# Patient Record
Sex: Male | Born: 1945 | Race: White | Hispanic: No | Smoking: Former smoker
Health system: Southern US, Community
[De-identification: ages and names within clinical notes are randomized; demographics above are authoritative.]

## PROBLEM LIST (undated history)

## (undated) DIAGNOSIS — I10 Essential (primary) hypertension: Secondary | ICD-10-CM

## (undated) DIAGNOSIS — C801 Malignant (primary) neoplasm, unspecified: Secondary | ICD-10-CM

## (undated) DIAGNOSIS — G8929 Other chronic pain: Secondary | ICD-10-CM

## (undated) DIAGNOSIS — G894 Chronic pain syndrome: Secondary | ICD-10-CM

## (undated) DIAGNOSIS — E785 Hyperlipidemia, unspecified: Secondary | ICD-10-CM

## (undated) DIAGNOSIS — K469 Unspecified abdominal hernia without obstruction or gangrene: Secondary | ICD-10-CM

## (undated) DIAGNOSIS — M549 Dorsalgia, unspecified: Principal | ICD-10-CM

## (undated) DIAGNOSIS — Z972 Presence of dental prosthetic device (complete) (partial): Secondary | ICD-10-CM

## (undated) DIAGNOSIS — Z8719 Personal history of other diseases of the digestive system: Secondary | ICD-10-CM

## (undated) DIAGNOSIS — A159 Respiratory tuberculosis unspecified: Secondary | ICD-10-CM

## (undated) DIAGNOSIS — K649 Unspecified hemorrhoids: Secondary | ICD-10-CM

## (undated) DIAGNOSIS — M545 Low back pain, unspecified: Secondary | ICD-10-CM

## (undated) DIAGNOSIS — M1990 Unspecified osteoarthritis, unspecified site: Secondary | ICD-10-CM

## (undated) DIAGNOSIS — K219 Gastro-esophageal reflux disease without esophagitis: Secondary | ICD-10-CM

## (undated) DIAGNOSIS — I2510 Atherosclerotic heart disease of native coronary artery without angina pectoris: Secondary | ICD-10-CM

## (undated) DIAGNOSIS — K2270 Barrett's esophagus without dysplasia: Secondary | ICD-10-CM

## (undated) DIAGNOSIS — F40240 Claustrophobia: Secondary | ICD-10-CM

## (undated) DIAGNOSIS — R634 Abnormal weight loss: Secondary | ICD-10-CM

## (undated) HISTORY — DX: Low back pain: M54.5

## (undated) HISTORY — DX: Dorsalgia, unspecified: M54.9

## (undated) HISTORY — DX: Chronic pain syndrome: G89.4

## (undated) HISTORY — PX: BACK SURGERY: SHX140

## (undated) HISTORY — DX: Gastro-esophageal reflux disease without esophagitis: K21.9

## (undated) HISTORY — PX: FOOT SURGERY: SHX648

## (undated) HISTORY — PX: MULTIPLE TOOTH EXTRACTIONS: SHX2053

## (undated) HISTORY — PX: OTHER SURGICAL HISTORY: SHX169

## (undated) HISTORY — DX: Low back pain, unspecified: M54.50

## (undated) HISTORY — PX: CYSTECTOMY: SUR359

## (undated) HISTORY — DX: Other chronic pain: G89.29

## (undated) HISTORY — PX: ESOPHAGEAL DILATION: SHX303

---
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
## 1994-03-25 HISTORY — PX: NECK SURGERY: SHX720

## 1996-03-25 HISTORY — PX: OTHER SURGICAL HISTORY: SHX169

## 1997-03-25 DIAGNOSIS — C801 Malignant (primary) neoplasm, unspecified: Secondary | ICD-10-CM

## 1997-03-25 HISTORY — DX: Malignant (primary) neoplasm, unspecified: C80.1

## 2005-08-28 ENCOUNTER — Ambulatory Visit: Payer: Self-pay | Admitting: Gastroenterology

## 2010-07-17 ENCOUNTER — Ambulatory Visit: Payer: Self-pay | Admitting: Emergency Medicine

## 2010-07-17 LAB — HM COLONOSCOPY

## 2010-09-05 ENCOUNTER — Ambulatory Visit: Payer: Self-pay | Admitting: Gastroenterology

## 2012-04-16 ENCOUNTER — Ambulatory Visit: Payer: Self-pay | Admitting: Emergency Medicine

## 2012-04-16 DIAGNOSIS — I10 Essential (primary) hypertension: Secondary | ICD-10-CM

## 2012-04-16 LAB — CBC WITH DIFFERENTIAL/PLATELET
Basophil #: 0.1 10*3/uL (ref 0.0–0.1)
Basophil %: 1 %
Eosinophil #: 0.2 10*3/uL (ref 0.0–0.7)
Eosinophil %: 2.2 %
HGB: 13.2 g/dL (ref 13.0–18.0)
Lymphocyte %: 14.1 %
MCH: 29.3 pg (ref 26.0–34.0)
MCHC: 32.5 g/dL (ref 32.0–36.0)
MCV: 90 fL (ref 80–100)
Monocyte %: 10.6 %
Neutrophil %: 72.1 %
RBC: 4.5 10*6/uL (ref 4.40–5.90)
WBC: 11.3 10*3/uL — ABNORMAL HIGH (ref 3.8–10.6)

## 2012-04-23 ENCOUNTER — Ambulatory Visit: Payer: Self-pay | Admitting: Emergency Medicine

## 2012-06-30 DIAGNOSIS — M069 Rheumatoid arthritis, unspecified: Secondary | ICD-10-CM | POA: Insufficient documentation

## 2013-01-19 DIAGNOSIS — L304 Erythema intertrigo: Secondary | ICD-10-CM | POA: Insufficient documentation

## 2013-03-25 DIAGNOSIS — A159 Respiratory tuberculosis unspecified: Secondary | ICD-10-CM

## 2013-03-25 HISTORY — DX: Respiratory tuberculosis unspecified: A15.9

## 2013-09-28 ENCOUNTER — Ambulatory Visit: Payer: Self-pay | Admitting: Family Medicine

## 2013-10-22 ENCOUNTER — Ambulatory Visit: Payer: Self-pay

## 2014-07-04 DIAGNOSIS — M0570 Rheumatoid arthritis with rheumatoid factor of unspecified site without organ or systems involvement: Secondary | ICD-10-CM | POA: Diagnosis not present

## 2014-07-15 NOTE — Op Note (Signed)
PATIENT NAME:  Travis Haynes, Travis Haynes MR#:  [id number] DATE OF BIRTH:  07-17-1945  DATE OF PROCEDURE:  04/23/2012  PREOPERATIVE DIAGNOSES:   1.  Hemorrhoids.  2.  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   POSTOPERATIVE DIAGNOSES:   1.  Hemorrhoids.  2.  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   PROCEDURE:  1.  Hemorrhoidectomy.  2.  Excision of soft tissue tumor of the left gluteal region.   SURGEON: Greydis Stlouis S. Phylis Bougie, M.D.   INDICATIONS: This patient has a history of rheumatoid arthritis and felt a lump in the ischial region on the back of his left leg. It is getting harder and it hurts when he sits for a long time. He says he has had this for a long time and it is very sore when he sits on it.   DESCRIPTION OF PROCEDURE:  The patient was brought to surgery. The jackknife position was made. The buttock was then pulled apart. The rectum part was then covered with a towel. Incision was made on top of this lump. After cutting skin and subcutaneous tissue, the patient had a nodule which was under the skin, was going down towards the joint. It was then excised. It was hard and it looked like it could be a rheumatic soft tissue nodule due to fibrous tissue or due to maybe cartilaginous tissue. The whole thing was then excised. The wound was then closed with 4-0 nylon interrupted stitches.   After that, rectal examination was performed and there was an area that looked like a big skin tag at about the 11 o'clock position. It was grasped with an Allis clamp, lifted up and dissected off from the skin around it and taken out into the rectal area. It was suture ligated there and then this area was excised with a little mucous membrane. Hemorrhoids were really small. It was then excised and the wound was then closed with running 4-0 chromic sutures. The other area, which was smaller than this, was also then lifted up and it was then excised and sutured with 4-0 chromic sutures. The patient tolerated the  procedure well Marcaine was injected and he was then sent to recovery room in satisfactory condition.   ____________________________ Welford Roche Phylis Bougie, MD msh:jm D: 04/23/2012 13:35:00 ET T: 04/23/2012 14:50:26 ET JOB#: [id number]  cc: Wanda Cellucci S. Phylis Bougie, MD, <Dictator> Sharene Butters MD ELECTRONICALLY SIGNED 04/23/2012 17:01

## 2014-07-18 DIAGNOSIS — I10 Essential (primary) hypertension: Secondary | ICD-10-CM | POA: Diagnosis not present

## 2014-07-18 DIAGNOSIS — A159 Respiratory tuberculosis unspecified: Secondary | ICD-10-CM | POA: Diagnosis not present

## 2014-07-18 DIAGNOSIS — K219 Gastro-esophageal reflux disease without esophagitis: Secondary | ICD-10-CM | POA: Diagnosis not present

## 2014-07-18 DIAGNOSIS — R52 Pain, unspecified: Secondary | ICD-10-CM | POA: Diagnosis not present

## 2014-07-18 DIAGNOSIS — M069 Rheumatoid arthritis, unspecified: Secondary | ICD-10-CM | POA: Diagnosis not present

## 2014-07-18 DIAGNOSIS — Z0000 Encounter for general adult medical examination without abnormal findings: Secondary | ICD-10-CM | POA: Diagnosis not present

## 2014-07-18 LAB — PSA

## 2014-09-20 ENCOUNTER — Telehealth: Payer: Self-pay | Admitting: Unknown Physician Specialty

## 2014-09-20 DIAGNOSIS — K219 Gastro-esophageal reflux disease without esophagitis: Secondary | ICD-10-CM

## 2014-09-20 DIAGNOSIS — K222 Esophageal obstruction: Principal | ICD-10-CM

## 2014-09-20 NOTE — Telephone Encounter (Signed)
Pt called would like a referral for [facility] to have esophagus stretched . Would like a call back at the number provided

## 2014-09-20 NOTE — Telephone Encounter (Signed)
Routing to provider  

## 2014-09-20 NOTE — Telephone Encounter (Signed)
Called and let patient know that the referral he requested was put in.

## 2014-11-21 DIAGNOSIS — R1310 Dysphagia, unspecified: Secondary | ICD-10-CM | POA: Diagnosis not present

## 2014-11-30 DIAGNOSIS — I2510 Atherosclerotic heart disease of native coronary artery without angina pectoris: Secondary | ICD-10-CM | POA: Diagnosis not present

## 2014-11-30 DIAGNOSIS — M069 Rheumatoid arthritis, unspecified: Secondary | ICD-10-CM | POA: Diagnosis not present

## 2014-11-30 DIAGNOSIS — K219 Gastro-esophageal reflux disease without esophagitis: Secondary | ICD-10-CM | POA: Diagnosis not present

## 2014-11-30 DIAGNOSIS — E784 Other hyperlipidemia: Secondary | ICD-10-CM | POA: Diagnosis not present

## 2014-11-30 DIAGNOSIS — I10 Essential (primary) hypertension: Secondary | ICD-10-CM | POA: Diagnosis not present

## 2014-12-02 ENCOUNTER — Encounter: Payer: Self-pay | Admitting: *Deleted

## 2014-12-05 ENCOUNTER — Ambulatory Visit: Payer: Medicare Other | Admitting: Anesthesiology

## 2014-12-05 ENCOUNTER — Ambulatory Visit
Admission: RE | Admit: 2014-12-05 | Discharge: 2014-12-05 | Disposition: A | Discharge status: Home / Self Care | Payer: Medicare Other | Source: Ambulatory Visit | Attending: Gastroenterology | Admitting: Gastroenterology

## 2014-12-05 ENCOUNTER — Encounter
Admission: RE | Disposition: A | Discharge status: Home / Self Care | Payer: Self-pay | Source: Ambulatory Visit | Attending: Gastroenterology

## 2014-12-05 ENCOUNTER — Encounter: Payer: Self-pay | Admitting: *Deleted

## 2014-12-05 DIAGNOSIS — I2510 Atherosclerotic heart disease of native coronary artery without angina pectoris: Secondary | ICD-10-CM | POA: Diagnosis not present

## 2014-12-05 DIAGNOSIS — Z87891 Personal history of nicotine dependence: Secondary | ICD-10-CM | POA: Insufficient documentation

## 2014-12-05 DIAGNOSIS — I10 Essential (primary) hypertension: Secondary | ICD-10-CM | POA: Diagnosis not present

## 2014-12-05 DIAGNOSIS — E785 Hyperlipidemia, unspecified: Secondary | ICD-10-CM | POA: Diagnosis not present

## 2014-12-05 DIAGNOSIS — R1310 Dysphagia, unspecified: Secondary | ICD-10-CM | POA: Diagnosis not present

## 2014-12-05 DIAGNOSIS — Z79899 Other long term (current) drug therapy: Secondary | ICD-10-CM | POA: Insufficient documentation

## 2014-12-05 DIAGNOSIS — K222 Esophageal obstruction: Secondary | ICD-10-CM | POA: Insufficient documentation

## 2014-12-05 DIAGNOSIS — Z7951 Long term (current) use of inhaled steroids: Secondary | ICD-10-CM | POA: Insufficient documentation

## 2014-12-05 DIAGNOSIS — K219 Gastro-esophageal reflux disease without esophagitis: Secondary | ICD-10-CM | POA: Diagnosis not present

## 2014-12-05 HISTORY — DX: Essential (primary) hypertension: I10

## 2014-12-05 HISTORY — DX: Unspecified osteoarthritis, unspecified site: M19.90

## 2014-12-05 HISTORY — PX: ESOPHAGOGASTRODUODENOSCOPY (EGD) WITH PROPOFOL: SHX5813

## 2014-12-05 HISTORY — DX: Unspecified abdominal hernia without obstruction or gangrene: K46.9

## 2014-12-05 HISTORY — DX: Atherosclerotic heart disease of native coronary artery without angina pectoris: I25.10

## 2014-12-05 HISTORY — DX: Unspecified hemorrhoids: K64.9

## 2014-12-05 HISTORY — DX: Hyperlipidemia, unspecified: E78.5

## 2014-12-05 HISTORY — DX: Barrett's esophagus without dysplasia: K22.70

## 2014-12-05 SURGERY — ESOPHAGOGASTRODUODENOSCOPY (EGD) WITH PROPOFOL
Anesthesia: General

## 2014-12-05 MED ORDER — FENTANYL CITRATE (PF) 100 MCG/2ML IJ SOLN
INTRAMUSCULAR | Status: DC | PRN
  Administered 2014-12-05: 50 ug via INTRAVENOUS

## 2014-12-05 MED ORDER — SODIUM CHLORIDE 0.9 % IV SOLN
INTRAVENOUS | Status: DC
  Administered 2014-12-05: 1000 mL via INTRAVENOUS

## 2014-12-05 MED ORDER — PROPOFOL 10 MG/ML IV BOLUS
INTRAVENOUS | Status: DC | PRN
  Administered 2014-12-05: 50 mg via INTRAVENOUS
  Administered 2014-12-05: 55 mg via INTRAVENOUS
  Administered 2014-12-05: 50 mg via INTRAVENOUS

## 2014-12-05 MED ORDER — MIDAZOLAM HCL 5 MG/5ML IJ SOLN
INTRAMUSCULAR | Status: DC | PRN
  Administered 2014-12-05: 1 mg via INTRAVENOUS

## 2014-12-05 NOTE — H&P (Signed)
  Date of Initial H&P: 11/21/2014  History reviewed, patient examined, no change in status, stable for surgery.

## 2014-12-05 NOTE — Anesthesia Postprocedure Evaluation (Signed)
  Anesthesia Post-op Note  Patient: Travis Haynes  Procedure(s) Performed: Procedure(s): ESOPHAGOGASTRODUODENOSCOPY (EGD) WITH PROPOFOL (N/A)  Anesthesia type:General  Patient location: PACU  Post pain: Pain level controlled  Post assessment: Post-op Vital signs reviewed, Patient's Cardiovascular Status Stable, Respiratory Function Stable, Patent Airway and No signs of Nausea or vomiting  Post vital signs: Reviewed and stable  Last Vitals:  Filed Vitals:   12/05/14 1020  BP: 154/65  Pulse: 92  Temp: 36.8 C  Resp: 18    Level of consciousness: awake, alert  and patient cooperative  Complications: No apparent anesthesia complications

## 2014-12-05 NOTE — Anesthesia Preprocedure Evaluation (Signed)
Anesthesia Evaluation    Airway Mallampati: II  TM Distance: >3 FB Neck ROM: Full    Dental  (+) Edentulous Upper, Edentulous Lower   Pulmonary former smoker,           Cardiovascular hypertension, Pt. on medications      Neuro/Psych    GI/Hepatic GERD  Medicated and Controlled,  Endo/Other    Renal/GU      Musculoskeletal   Abdominal   Peds  Hematology   Anesthesia Other Findings   Reproductive/Obstetrics                             Anesthesia Physical Anesthesia Plan  ASA: II  Anesthesia Plan: General   Post-op Pain Management:    Induction: Intravenous  Airway Management Planned: Nasal Cannula  Additional Equipment:   Intra-op Plan:   Post-operative Plan:   Informed Consent: I have reviewed the patients History and Physical, chart, labs and discussed the procedure including the risks, benefits and alternatives for the proposed anesthesia with the patient or authorized representative who has indicated his/her understanding and acceptance.     Plan Discussed with:   Anesthesia Plan Comments:         Anesthesia Quick Evaluation

## 2014-12-05 NOTE — Transfer of Care (Signed)
Immediate Anesthesia Transfer of Care Note  Patient: Travis Haynes  Procedure(s) Performed: Procedure(s): ESOPHAGOGASTRODUODENOSCOPY (EGD) WITH PROPOFOL (N/A)  Patient Location: PACU  Anesthesia Type:General  Level of Consciousness: sedated  Airway & Oxygen Therapy: Patient Spontanous Breathing  Post-op Assessment: Report given to RN  Post vital signs: stable  Last Vitals:  Filed Vitals:   12/05/14 1020  BP: 154/65  Pulse: 92  Temp: 36.8 C  Resp: 18    Complications: No apparent anesthesia complications

## 2014-12-05 NOTE — Op Note (Signed)
[facility] Gastroenterology Patient Name: Travis Haynes Procedure Date: 12/05/2014 10:42 AM MRN: [id number] Account #: [id number] Date of Birth: 1945-07-06 Admit Type: Outpatient Age: 69 Room: [facility] ENDO ROOM 4 Gender: Male Note Status: Finalized Procedure:         Upper GI endoscopy Indications:       Dysphagia Providers:         Lupita Dawn. Candace Cruise, MD Referring MD:      Guadalupe Maple, MD (Referring MD) Medicines:         Monitored Anesthesia Care Complications:     No immediate complications. Procedure:         Pre-Anesthesia Assessment:                    - Prior to the procedure, a History and Physical was                     performed, and patient medications, allergies and                     sensitivities were reviewed. The patient's tolerance of                     previous anesthesia was reviewed.                    - The risks and benefits of the procedure and the sedation                     options and risks were discussed with the patient. All                     questions were answered and informed consent was obtained.                    - After reviewing the risks and benefits, the patient was                     deemed in satisfactory condition to undergo the procedure.                    After obtaining informed consent, the endoscope was passed                     under direct vision. Throughout the procedure, the                     patient's blood pressure, pulse, and oxygen saturations                     were monitored continuously. The Olympus GIF-160 endoscope                     (S#. [id number]) was introduced through the mouth, and                     advanced to the second part of duodenum. The upper GI                     endoscopy was accomplished without difficulty. The upper                     GI endoscopy was accomplished without difficulty. The  patient tolerated the procedure well. Findings:      A benign-appearing,  intrinsic stenosis was found at the gastroesophageal       junction. The scope was withdrawn. Dilation was performed with a Maloney       dilator with moderate resistance at 54 Fr.      The exam was otherwise without abnormality.      The entire examined stomach was normal.      The examined duodenum was normal. Impression:        - Benign-appearing esophageal stricture. Dilated.                    - The examination was otherwise normal.                    - Normal stomach.                    - Normal examined duodenum.                    - No specimens collected. Recommendation:    - Discharge patient to home.                    - Observe patient's clinical course.                    - The findings and recommendations were discussed with the                     patient. Procedure Code(s): --- Professional ---                    [phone], Esophagogastroduodenoscopy, flexible, transoral;                     diagnostic, including collection of specimen(s) by                     brushing or washing, when performed (separate procedure)                    43450, Dilation of esophagus, by unguided sound or bougie,                     single or multiple passes Diagnosis Code(s): --- Professional ---                    K22.2, Esophageal obstruction                    R13.10, Dysphagia, unspecified CPT copyright 2014 American Medical Association. All rights reserved. The codes documented in this report are preliminary and upon coder review may  be revised to meet current compliance requirements. Hulen Luster, MD 12/05/2014 10:59:26 AM This report has been signed electronically. Number of Addenda: 0 Note Initiated On: 12/05/2014 10:42 AM      [facility]

## 2014-12-06 ENCOUNTER — Encounter: Payer: Self-pay | Admitting: Gastroenterology

## 2015-01-16 DIAGNOSIS — M549 Dorsalgia, unspecified: Principal | ICD-10-CM

## 2015-01-16 DIAGNOSIS — A159 Respiratory tuberculosis unspecified: Secondary | ICD-10-CM

## 2015-01-16 DIAGNOSIS — E785 Hyperlipidemia, unspecified: Secondary | ICD-10-CM

## 2015-01-16 DIAGNOSIS — K219 Gastro-esophageal reflux disease without esophagitis: Secondary | ICD-10-CM

## 2015-01-16 DIAGNOSIS — G8929 Other chronic pain: Secondary | ICD-10-CM

## 2015-01-16 DIAGNOSIS — I2510 Atherosclerotic heart disease of native coronary artery without angina pectoris: Secondary | ICD-10-CM | POA: Insufficient documentation

## 2015-01-16 DIAGNOSIS — I10 Essential (primary) hypertension: Secondary | ICD-10-CM | POA: Insufficient documentation

## 2015-01-18 ENCOUNTER — Other Ambulatory Visit: Payer: Self-pay

## 2015-01-18 NOTE — Telephone Encounter (Signed)
LAST VISIT: 07/18/2014 Patient has an upcoming appointment 01/23/2015 [facility]: [phone]  Requesting tramadol 50 mg.

## 2015-01-19 MED ORDER — TRAMADOL HCL 50 MG PO TABS: 50.0000 mg | ORAL_TABLET | Freq: Three times a day (TID) | ORAL | Status: DC | PRN

## 2015-01-23 ENCOUNTER — Encounter: Payer: Self-pay | Admitting: Unknown Physician Specialty

## 2015-01-23 ENCOUNTER — Ambulatory Visit (INDEPENDENT_AMBULATORY_CARE_PROVIDER_SITE_OTHER): Payer: Medicare Other | Admitting: Unknown Physician Specialty

## 2015-01-23 VITALS — BP 145/80 | HR 101 | Temp 98.5°F | Ht 67.3 in | Wt 168.2 lb

## 2015-01-23 DIAGNOSIS — Z0000 Encounter for general adult medical examination without abnormal findings: Secondary | ICD-10-CM | POA: Diagnosis not present

## 2015-01-23 DIAGNOSIS — E785 Hyperlipidemia, unspecified: Secondary | ICD-10-CM

## 2015-01-23 DIAGNOSIS — J309 Allergic rhinitis, unspecified: Secondary | ICD-10-CM | POA: Insufficient documentation

## 2015-01-23 DIAGNOSIS — I10 Essential (primary) hypertension: Secondary | ICD-10-CM

## 2015-01-23 MED ORDER — OMEPRAZOLE 20 MG PO CPDR: 40.0000 mg | DELAYED_RELEASE_CAPSULE | Freq: Every day | ORAL | Status: DC

## 2015-01-23 MED ORDER — ATORVASTATIN CALCIUM 10 MG PO TABS: 10.0000 mg | ORAL_TABLET | Freq: Every day | ORAL | Status: DC

## 2015-01-23 MED ORDER — FLUTICASONE PROPIONATE 50 MCG/ACT NA SUSP: 2.0000 | Freq: Every day | NASAL | Status: DC

## 2015-01-23 MED ORDER — AMLODIPINE BESYLATE 10 MG PO TABS: 10.0000 mg | ORAL_TABLET | Freq: Every day | ORAL | Status: DC

## 2015-01-23 NOTE — Assessment & Plan Note (Signed)
Will rx flonase.

## 2015-01-23 NOTE — Progress Notes (Signed)
BP 145/80 mmHg  Pulse 101  Temp(Src) 98.5 F (36.9 C)  Ht 5' 7.3" (1.709 m)  Wt 168 lb 3.2 oz (76.295 kg)  BMI 26.12 kg/m2  SpO2 97%   Subjective:    Patient ID: Travis Haynes, male    DOB: 14-Jun-1945, 69 y.o.   MRN: [id number]  HPI: Travis Haynes is a 69 y.o. male  Chief Complaint  Patient presents with  . Hyperlipidemia  . Hypertension   Hypertension:  Using medications without difficulty Average home BP SBP 120's  Using medication without problems or lightheadedness No chest pain with exertion or shortness of breath Edema: States his legs and feet "swell some."  Elevated Cholesterol: Using medications without problems: Muscle aches: left calve that is long-standing Diet compliance: good Exercise: none due to chronic pain Other complaints:  Pain: Present dose:   The morphine equivalent dose is 30 mgs.  Takes Tylenol in the AM (if he is going somewhere that day). Will take the Aleve about 2 pm and once about midnight. Takes Tramadol 50 mg 3 times daily. Tramadol useful at night for muscle spasm. Prednisone helps a lot with pain.  Pain control status:  controlled  H6 Location:  H1 all over because of arthritis. Can be bad in hands when using cane.  Quality:  sore H2 Current pain level:  mild H3 Aggravating factors:   worse in the morning  H7 Benefit from narcotic medications:  yes  H6 What Activities task can be accomplished with current medication?  sleeps better  Interested in weaning off narcotics:  no H6    Relevant past medical, surgical, family and social history reviewed and updated as indicated. Interim medical history since our last visit reviewed. Allergies and medications reviewed and updated.  Review of Systems  HENT: Positive for congestion.        Itchy nose and sometimes sore.   Eyes: Negative.   Respiratory: Negative.   Cardiovascular: Negative.   Gastrointestinal: Negative.   Endocrine: Negative.   Genitourinary: Negative.    Musculoskeletal: Positive for joint swelling.  Skin: Negative.   Neurological: Negative.   Psychiatric/Behavioral: Negative.     Per HPI unless specifically indicated above     Objective:    BP 145/80 mmHg  Pulse 101  Temp(Src) 98.5 F (36.9 C)  Ht 5' 7.3" (1.709 m)  Wt 168 lb 3.2 oz (76.295 kg)  BMI 26.12 kg/m2  SpO2 97%  Wt Readings from Last 3 Encounters:  01/23/15 168 lb 3.2 oz (76.295 kg)  07/18/14 167 lb (75.751 kg)  12/05/14 170 lb (77.111 kg)    Physical Exam  Constitutional: He is oriented to person, place, and time. He appears well-developed and well-nourished. No distress.  HENT:  Head: Normocephalic and atraumatic.  Eyes: Conjunctivae and lids are normal. Right eye exhibits no discharge. Left eye exhibits no discharge. No scleral icterus.  Cardiovascular: Normal rate, regular rhythm and normal heart sounds.   Pulmonary/Chest: Effort normal and breath sounds normal. No respiratory distress.  Abdominal: Normal appearance. He exhibits no distension. There is no splenomegaly or hepatomegaly. There is no tenderness.  Musculoskeletal: Normal range of motion.  Neurological: He is alert and oriented to person, place, and time.  Skin: Skin is intact. No rash noted. No pallor.  Psychiatric: He has a normal mood and affect. His behavior is normal. Judgment and thought content normal.      Assessment & Plan:   Problem List Items Addressed This Visit  Unprioritized   BP (high blood pressure)    A little high today, but good numbers at home.        Relevant Medications   amLODipine (NORVASC) 10 MG tablet   atorvastatin (LIPITOR) 10 MG tablet   Hyperlipidemia - Primary   Relevant Medications   amLODipine (NORVASC) 10 MG tablet   atorvastatin (LIPITOR) 10 MG tablet   Other Relevant Orders   Comprehensive metabolic panel   Lipid Panel w/o Chol/HDL Ratio   Allergic rhinitis    Will rx flonase.         Other Visit Diagnoses    Routine health maintenance         Relevant Orders    Hepatitis C antibody        Follow up plan: Return in about 6 months (around 07/23/2015) for physical.

## 2015-01-23 NOTE — Assessment & Plan Note (Signed)
A little high today, but good numbers at home.

## 2015-01-24 ENCOUNTER — Encounter: Payer: Self-pay | Admitting: Unknown Physician Specialty

## 2015-01-24 LAB — COMPREHENSIVE METABOLIC PANEL
ALBUMIN: 4.1 g/dL (ref 3.6–4.8)
ALT: 21 IU/L (ref 0–44)
AST: 22 IU/L (ref 0–40)
Albumin/Globulin Ratio: 1.3 (ref 1.1–2.5)
Alkaline Phosphatase: 88 IU/L (ref 39–117)
BILIRUBIN TOTAL: 0.6 mg/dL (ref 0.0–1.2)
BUN / CREAT RATIO: 14 (ref 10–22)
BUN: 12 mg/dL (ref 8–27)
CALCIUM: 9.2 mg/dL (ref 8.6–10.2)
CHLORIDE: 97 mmol/L (ref 97–106)
CO2: 24 mmol/L (ref 18–29)
CREATININE: 0.88 mg/dL (ref 0.76–1.27)
GFR, EST AFRICAN AMERICAN: 102 mL/min/{1.73_m2} (ref >59)
GFR, EST NON AFRICAN AMERICAN: 88 mL/min/{1.73_m2} (ref >59)
GLUCOSE: 108 mg/dL — AB (ref 65–99)
Globulin, Total: 3.1 g/dL (ref 1.5–4.5)
Potassium: 3.9 mmol/L (ref 3.5–5.2)
Sodium: 138 mmol/L (ref 136–144)
TOTAL PROTEIN: 7.2 g/dL (ref 6.0–8.5)

## 2015-01-24 LAB — LIPID PANEL W/O CHOL/HDL RATIO
CHOLESTEROL TOTAL: 122 mg/dL (ref 100–199)
HDL: 52 mg/dL (ref >39)
LDL CALC: 48 mg/dL (ref 0–99)
TRIGLYCERIDES: 111 mg/dL (ref 0–149)
VLDL CHOLESTEROL CAL: 22 mg/dL (ref 5–40)

## 2015-01-24 LAB — HEPATITIS C ANTIBODY: Hep C Virus Ab: <0.1 s/co ratio (ref 0.0–0.9)

## 2015-01-24 LAB — LIPID PANEL PICCOLO, WAIVED

## 2015-01-24 NOTE — Progress Notes (Signed)
Quick Note:  Normal labs. Patient notified by letter. ______ 

## 2015-03-22 DIAGNOSIS — L304 Erythema intertrigo: Secondary | ICD-10-CM | POA: Diagnosis not present

## 2015-03-22 DIAGNOSIS — L219 Seborrheic dermatitis, unspecified: Secondary | ICD-10-CM | POA: Diagnosis not present

## 2015-03-22 DIAGNOSIS — L918 Other hypertrophic disorders of the skin: Secondary | ICD-10-CM | POA: Diagnosis not present

## 2015-05-23 ENCOUNTER — Other Ambulatory Visit: Payer: Self-pay

## 2015-05-23 MED ORDER — TRAMADOL HCL 50 MG PO TABS: 50.0000 mg | ORAL_TABLET | Freq: Three times a day (TID) | ORAL | Status: DC | PRN

## 2015-05-23 NOTE — Telephone Encounter (Signed)
Patient was last seen 01/23/15 and has appointment 07/21/15.

## 2015-06-19 ENCOUNTER — Other Ambulatory Visit: Payer: Self-pay

## 2015-06-19 MED ORDER — TRAMADOL HCL 50 MG PO TABS: 50.0000 mg | ORAL_TABLET | Freq: Three times a day (TID) | ORAL | Status: DC | PRN

## 2015-06-19 NOTE — Telephone Encounter (Signed)
Patient was last seen 01/23/15 and has appointment 07/21/15.

## 2015-07-03 DIAGNOSIS — M059 Rheumatoid arthritis with rheumatoid factor, unspecified: Secondary | ICD-10-CM | POA: Diagnosis not present

## 2015-07-03 DIAGNOSIS — Z7951 Long term (current) use of inhaled steroids: Secondary | ICD-10-CM | POA: Diagnosis not present

## 2015-07-03 DIAGNOSIS — Z7409 Other reduced mobility: Secondary | ICD-10-CM | POA: Diagnosis not present

## 2015-07-03 DIAGNOSIS — Z7952 Long term (current) use of systemic steroids: Secondary | ICD-10-CM | POA: Diagnosis not present

## 2015-07-03 DIAGNOSIS — Z5181 Encounter for therapeutic drug level monitoring: Secondary | ICD-10-CM | POA: Diagnosis not present

## 2015-07-03 DIAGNOSIS — Z9225 Personal history of immunosupression therapy: Secondary | ICD-10-CM | POA: Diagnosis not present

## 2015-07-03 DIAGNOSIS — Z79899 Other long term (current) drug therapy: Secondary | ICD-10-CM | POA: Diagnosis not present

## 2015-07-03 DIAGNOSIS — Z791 Long term (current) use of non-steroidal anti-inflammatories (NSAID): Secondary | ICD-10-CM | POA: Diagnosis not present

## 2015-07-03 DIAGNOSIS — Z79891 Long term (current) use of opiate analgesic: Secondary | ICD-10-CM | POA: Diagnosis not present

## 2015-07-03 DIAGNOSIS — Z9221 Personal history of antineoplastic chemotherapy: Secondary | ICD-10-CM | POA: Diagnosis not present

## 2015-07-03 DIAGNOSIS — I10 Essential (primary) hypertension: Secondary | ICD-10-CM | POA: Diagnosis not present

## 2015-07-03 DIAGNOSIS — Z87891 Personal history of nicotine dependence: Secondary | ICD-10-CM | POA: Diagnosis not present

## 2015-07-03 DIAGNOSIS — Z981 Arthrodesis status: Secondary | ICD-10-CM | POA: Diagnosis not present

## 2015-07-03 DIAGNOSIS — Z888 Allergy status to other drugs, medicaments and biological substances status: Secondary | ICD-10-CM | POA: Diagnosis not present

## 2015-07-03 DIAGNOSIS — C8513 Unspecified B-cell lymphoma, intra-abdominal lymph nodes: Secondary | ICD-10-CM | POA: Diagnosis not present

## 2015-07-21 ENCOUNTER — Ambulatory Visit (INDEPENDENT_AMBULATORY_CARE_PROVIDER_SITE_OTHER): Payer: Medicare Other | Admitting: Unknown Physician Specialty

## 2015-07-21 ENCOUNTER — Encounter: Payer: Self-pay | Admitting: Unknown Physician Specialty

## 2015-07-21 VITALS — BP 136/75 | HR 111 | Temp 98.6°F | Ht 67.8 in | Wt 155.2 lb

## 2015-07-21 DIAGNOSIS — I10 Essential (primary) hypertension: Secondary | ICD-10-CM

## 2015-07-21 DIAGNOSIS — E785 Hyperlipidemia, unspecified: Secondary | ICD-10-CM

## 2015-07-21 DIAGNOSIS — G8929 Other chronic pain: Secondary | ICD-10-CM

## 2015-07-21 MED ORDER — TRAMADOL HCL 50 MG PO TABS: 50.0000 mg | ORAL_TABLET | Freq: Three times a day (TID) | ORAL | Status: DC | PRN

## 2015-07-21 MED ORDER — ATORVASTATIN CALCIUM 10 MG PO TABS: 10.0000 mg | ORAL_TABLET | Freq: Every day | ORAL | Status: DC

## 2015-07-21 MED ORDER — AMLODIPINE BESYLATE 10 MG PO TABS: 10.0000 mg | ORAL_TABLET | Freq: Every day | ORAL | Status: DC

## 2015-07-21 MED ORDER — OMEPRAZOLE 20 MG PO CPDR: 40.0000 mg | DELAYED_RELEASE_CAPSULE | Freq: Every day | ORAL | Status: DC

## 2015-07-21 NOTE — Assessment & Plan Note (Signed)
Foot edema is a problem.  Decrease Amlodipine to 5 mg by taking 1/2 pill and see if that helps

## 2015-07-21 NOTE — Assessment & Plan Note (Signed)
Continue Tramadol as prescribed

## 2015-07-21 NOTE — Progress Notes (Signed)
BP 136/75 mmHg  Pulse 111  Temp(Src) 98.6 F (37 C)  Ht 5' 7.8" (1.722 m)  Wt 155 lb 3.2 oz (70.398 kg)  BMI 23.74 kg/m2  SpO2 96%   Subjective:    Patient ID: Travis Haynes, male    DOB: 1945/10/06, 70 y.o.   MRN: [id number]  HPI: Travis Haynes is a 70 y.o. male  Chief Complaint  Patient presents with  . Hyperlipidemia  . Pain  . Medication Refill    pt states he needs tramadol refilled, would like 6 month supply   Hypertension:  Using medications without difficulty Average home BP SBP 120's  Using medication without problems or lightheadedness No chest pain with exertion or shortness of breath Edema: States his feet swell.  This has gotten worse in the last year.    Elevated Cholesterol: Using medications without problems: Muscle aches: left calve that is long-standing Diet compliance: good Exercise: none due to chronic pain  Had labs through [facility].  All but cholesterol but that was good 6 months ago   Pain: Present dose: On Tramadol.  The morphine equivalent dose is 30 mgs.  Takes Tylenol in the AM (if he is going somewhere that day). Will take the Aleve about 2 pm and once about midnight. Takes Tramadol 50 mg 3 times daily. Tramadol useful at night for muscle spasm. Prednisone helps a lot with pain.  Pain control status:  controlled  H6 Location:  H1 all over because of arthritis. Can be bad in hands when using cane.  Quality:  sore H2 Current pain level:  mild H3 Aggravating factors:   worse in the morning  H7 Benefit from narcotic medications:  yes  H6 What Activities task can be accomplished with current medication?  sleeps better  Interested in weaning off narcotics:  no H6    Relevant past medical, surgical, family and social history reviewed and updated as indicated. Interim medical history since our last visit reviewed. Allergies and medications reviewed and updated.  Review of Systems  HENT: Positive for congestion.        Itchy nose and  sometimes sore.   Eyes: Negative.   Respiratory: Negative.   Cardiovascular: Negative.   Gastrointestinal: Negative.   Endocrine: Negative.   Genitourinary: Negative.   Musculoskeletal: Positive for joint swelling.  Skin: Negative.   Neurological: Negative.   Psychiatric/Behavioral: Negative.     Per HPI unless specifically indicated above     Objective:    BP 136/75 mmHg  Pulse 111  Temp(Src) 98.6 F (37 C)  Ht 5' 7.8" (1.722 m)  Wt 155 lb 3.2 oz (70.398 kg)  BMI 23.74 kg/m2  SpO2 96%  Wt Readings from Last 3 Encounters:  07/21/15 155 lb 3.2 oz (70.398 kg)  01/23/15 168 lb 3.2 oz (76.295 kg)  07/18/14 167 lb (75.751 kg)    Physical Exam  Constitutional: He is oriented to person, place, and time. He appears well-developed and well-nourished. No distress.  HENT:  Head: Normocephalic and atraumatic.  Eyes: Conjunctivae and lids are normal. Right eye exhibits no discharge. Left eye exhibits no discharge. No scleral icterus.  Cardiovascular: Normal rate, regular rhythm and normal heart sounds.   Pulmonary/Chest: Effort normal and breath sounds normal. No respiratory distress.  Abdominal: Normal appearance. He exhibits no distension. There is no splenomegaly or hepatomegaly. There is no tenderness.  Musculoskeletal: Normal range of motion.  Neurological: He is alert and oriented to person, place, and time.  Skin:  Skin is intact. No rash noted. No pallor.  Psychiatric: He has a normal mood and affect. His behavior is normal. Judgment and thought content normal.   Labs from [facility] reviewed    Assessment & Plan:   Problem List Items Addressed This Visit      Unprioritized   BP (high blood pressure) - Primary    Foot edema is a problem.  Decrease Amlodipine to 5 mg by taking 1/2 pill and see if that helps      Relevant Medications   atorvastatin (LIPITOR) 10 MG tablet   amLODipine (NORVASC) 10 MG tablet   Chronic pain    Continue Tramadol as prescribed       Relevant Medications   traMADol (ULTRAM) 50 MG tablet   Hyperlipidemia    Stable, continue present medications.        Relevant Medications   atorvastatin (LIPITOR) 10 MG tablet   amLODipine (NORVASC) 10 MG tablet       Follow up plan: Return in about 4 weeks (around 08/18/2015).

## 2015-07-21 NOTE — Assessment & Plan Note (Signed)
Stable, continue present medications.   

## 2015-07-31 ENCOUNTER — Telehealth: Payer: Self-pay | Admitting: Unknown Physician Specialty

## 2015-07-31 MED ORDER — HYDROCHLOROTHIAZIDE 25 MG PO TABS: 25.0000 mg | ORAL_TABLET | Freq: Every day | ORAL | Status: DC

## 2015-07-31 NOTE — Telephone Encounter (Signed)
Pt was calling to find out if he would be prescribed a fluid pill. If so he would like it to go to [facility].

## 2015-07-31 NOTE — Telephone Encounter (Signed)
Called and let patient know rx was sent in.  

## 2015-07-31 NOTE — Telephone Encounter (Signed)
Routing to provider  

## 2015-07-31 NOTE — Telephone Encounter (Signed)
That would be fine.  Ordered

## 2015-08-22 ENCOUNTER — Encounter: Payer: Self-pay | Admitting: Unknown Physician Specialty

## 2015-08-22 ENCOUNTER — Ambulatory Visit (INDEPENDENT_AMBULATORY_CARE_PROVIDER_SITE_OTHER): Payer: Medicare Other | Admitting: Unknown Physician Specialty

## 2015-08-22 VITALS — BP 129/71 | HR 87 | Temp 98.2°F | Ht 67.5 in | Wt 158.6 lb

## 2015-08-22 DIAGNOSIS — I10 Essential (primary) hypertension: Secondary | ICD-10-CM | POA: Diagnosis not present

## 2015-08-22 MED ORDER — AMLODIPINE BESYLATE 5 MG PO TABS: 5.0000 mg | ORAL_TABLET | Freq: Every day | ORAL | Status: DC

## 2015-08-22 NOTE — Progress Notes (Signed)
BP 129/71 mmHg  Pulse 87  Temp(Src) 98.2 F (36.8 C)  Ht 5' 7.5" (1.715 m)  Wt 158 lb 9.6 oz (71.94 kg)  BMI 24.46 kg/m2  SpO2 97%   Subjective:    Patient ID: Travis Haynes, male    DOB: 20-Dec-1945, 70 y.o.   MRN: [id number]  HPI: LYFE FITZKE is a 70 y.o. male  Chief Complaint  Patient presents with  . Hypertension  . Medication Refill    pt states he would like to have 5 mg tablets of amlodipine sent in instead of breaking the 10 mg in half   Hypertension Last visit we decreased Amlodipine to 5 mg due to foot swelling.  Foot swelling is resolved.   Average home BP 120's/70's  No problems or lightheadedness No chest pain with exertion or shortness of breath   Relevant past medical, surgical, family and social history reviewed and updated as indicated. Interim medical history since our last visit reviewed. Allergies and medications reviewed and updated.  Review of Systems  Per HPI unless specifically indicated above     Objective:    BP 129/71 mmHg  Pulse 87  Temp(Src) 98.2 F (36.8 C)  Ht 5' 7.5" (1.715 m)  Wt 158 lb 9.6 oz (71.94 kg)  BMI 24.46 kg/m2  SpO2 97%  Wt Readings from Last 3 Encounters:  08/22/15 158 lb 9.6 oz (71.94 kg)  07/21/15 155 lb 3.2 oz (70.398 kg)  01/23/15 168 lb 3.2 oz (76.295 kg)    Physical Exam  Constitutional: He is oriented to person, place, and time. He appears well-developed and well-nourished. No distress.  HENT:  Head: Normocephalic and atraumatic.  Eyes: Conjunctivae and lids are normal. Right eye exhibits no discharge. Left eye exhibits no discharge. No scleral icterus.  Neck: Normal range of motion. Neck supple. No JVD present. Carotid bruit is not present.  Cardiovascular: Normal rate, regular rhythm and normal heart sounds.   Pulmonary/Chest: Effort normal and breath sounds normal. No respiratory distress.  Abdominal: Normal appearance. There is no splenomegaly or hepatomegaly.  Musculoskeletal: Normal range of  motion.  Neurological: He is alert and oriented to person, place, and time.  Skin: Skin is warm, dry and intact. No rash noted. No pallor.  Psychiatric: He has a normal mood and affect. His behavior is normal. Judgment and thought content normal.    Results for orders placed or performed in visit on 01/23/15  Hepatitis C antibody  Result Value Ref Range   Hep C Virus Ab <0.1 0.0 - 0.9 s/co ratio  Comprehensive metabolic panel  Result Value Ref Range   Glucose 108 (H) 65 - 99 mg/dL   BUN 12 8 - 27 mg/dL   Creatinine, Ser 0.88 0.76 - 1.27 mg/dL   GFR calc non Af Amer 88 >59 mL/min/1.73   GFR calc Af Amer 102 >59 mL/min/1.73   BUN/Creatinine Ratio 14 10 - 22   Sodium 138 136 - 144 mmol/L   Potassium 3.9 3.5 - 5.2 mmol/L   Chloride 97 97 - 106 mmol/L   CO2 24 18 - 29 mmol/L   Calcium 9.2 8.6 - 10.2 mg/dL   Total Protein 7.2 6.0 - 8.5 g/dL   Albumin 4.1 3.6 - 4.8 g/dL   Globulin, Total 3.1 1.5 - 4.5 g/dL   Albumin/Globulin Ratio 1.3 1.1 - 2.5   Bilirubin Total 0.6 0.0 - 1.2 mg/dL   Alkaline Phosphatase 88 39 - 117 IU/L   AST 22 0 - 40  IU/L   ALT 21 0 - 44 IU/L  Lipid Panel w/o Chol/HDL Ratio  Result Value Ref Range   Cholesterol, Total CANCELED mg/dL   Triglycerides CANCELED    HDL CANCELED   Lipid Panel w/o Chol/HDL Ratio  Result Value Ref Range   Cholesterol, Total 122 100 - 199 mg/dL   Triglycerides 111 0 - 149 mg/dL   HDL 52 >39 mg/dL   VLDL Cholesterol Cal 22 5 - 40 mg/dL   LDL Calculated 48 0 - 99 mg/dL  Lipid Panel Piccolo, Waived  Result Value Ref Range   Cholesterol Piccolo, Waived CANCELED mg/dL   HDL Chol Piccolo, Waived CANCELED    Triglycerides Piccolo,Waived CANCELED       Assessment & Plan:   Problem List Items Addressed This Visit      Unprioritized   BP (high blood pressure) - Primary    Stable on 5 mg of Amlodipine      Relevant Medications   amLODipine (NORVASC) 5 MG tablet       Follow up plan: Return in about 5 months (around  01/22/2016).

## 2015-08-22 NOTE — Assessment & Plan Note (Signed)
Stable on 5 mg of Amlodipine

## 2015-11-03 ENCOUNTER — Other Ambulatory Visit: Payer: Self-pay | Admitting: Family Medicine

## 2015-11-03 ENCOUNTER — Other Ambulatory Visit: Payer: Self-pay

## 2015-11-03 MED ORDER — HYDROCHLOROTHIAZIDE 25 MG PO TABS: 25.0000 mg | ORAL_TABLET | Freq: Every day | ORAL | 2 refills | Status: DC

## 2015-12-27 ENCOUNTER — Encounter: Payer: Self-pay | Admitting: Unknown Physician Specialty

## 2015-12-27 ENCOUNTER — Ambulatory Visit (INDEPENDENT_AMBULATORY_CARE_PROVIDER_SITE_OTHER): Payer: Medicare Other | Admitting: Unknown Physician Specialty

## 2015-12-27 DIAGNOSIS — L97511 Non-pressure chronic ulcer of other part of right foot limited to breakdown of skin: Secondary | ICD-10-CM

## 2015-12-27 DIAGNOSIS — H1013 Acute atopic conjunctivitis, bilateral: Secondary | ICD-10-CM | POA: Insufficient documentation

## 2015-12-27 DIAGNOSIS — L97519 Non-pressure chronic ulcer of other part of right foot with unspecified severity: Secondary | ICD-10-CM | POA: Insufficient documentation

## 2015-12-27 MED ORDER — CROMOLYN SODIUM 4 % OP SOLN: 1.0000 [drp] | Freq: Four times a day (QID) | OPHTHALMIC | 12 refills | Status: DC

## 2015-12-27 NOTE — Progress Notes (Signed)
BP 128/81 (BP Location: Left Arm, Cuff Size: Normal)   Pulse 83   Temp 98 F (36.7 C)   Ht 5' 7.8" (1.722 m)   Wt 159 lb 6.4 oz (72.3 kg)   SpO2 96%   BMI 24.38 kg/m    Subjective:    Patient ID: Travis Haynes, male    DOB: April 08, 1945, 70 y.o.   MRN: [id number]  HPI: Travis Haynes is a 70 y.o. male  Chief Complaint  Patient presents with  . Foot Problem    pt states he has a sore on right big toe that he would like looked at   . Eye Drainage    pt states he has been having runny eyes   Foot ulcer Pt is here for complaints of an area on his right toe that doesn't heal well   Eyes Scratchy eyes and draining at night.  Noting more irritation  Relevant past medical, surgical, family and social history reviewed and updated as indicated. Interim medical history since our last visit reviewed. Allergies and medications reviewed and updated.  Review of Systems  Per HPI unless specifically indicated above     Objective:    BP 128/81 (BP Location: Left Arm, Cuff Size: Normal)   Pulse 83   Temp 98 F (36.7 C)   Ht 5' 7.8" (1.722 m)   Wt 159 lb 6.4 oz (72.3 kg)   SpO2 96%   BMI 24.38 kg/m   Wt Readings from Last 3 Encounters:  12/27/15 159 lb 6.4 oz (72.3 kg)  08/22/15 158 lb 9.6 oz (71.9 kg)  07/21/15 155 lb 3.2 oz (70.4 kg)    Physical Exam  Constitutional: He is oriented to person, place, and time. He appears well-developed and well-nourished. No distress.  HENT:  Head: Normocephalic and atraumatic.  Eyes: Conjunctivae, EOM and lids are normal. Pupils are equal, round, and reactive to light. Right eye exhibits no discharge. Left eye exhibits no discharge. No scleral icterus.  Cardiovascular: Normal rate.   Pulmonary/Chest: Effort normal.  Abdominal: Normal appearance. There is no splenomegaly or hepatomegaly.  Musculoskeletal: Normal range of motion.  Neurological: He is alert and oriented to person, place, and time.  Skin: Skin is intact. No rash noted. No  pallor.  Right large toe debided revealing small ulcer.  Discussed with Dr. Jeananne Rama  Psychiatric: He has a normal mood and affect. His behavior is normal. Judgment and thought content normal.    Results for orders placed or performed in visit on 01/23/15  Hepatitis C antibody  Result Value Ref Range   Hep C Virus Ab <0.1 0.0 - 0.9 s/co ratio  Comprehensive metabolic panel  Result Value Ref Range   Glucose 108 (H) 65 - 99 mg/dL   BUN 12 8 - 27 mg/dL   Creatinine, Ser 0.88 0.76 - 1.27 mg/dL   GFR calc non Af Amer 88 >59 mL/min/1.73   GFR calc Af Amer 102 >59 mL/min/1.73   BUN/Creatinine Ratio 14 10 - 22   Sodium 138 136 - 144 mmol/L   Potassium 3.9 3.5 - 5.2 mmol/L   Chloride 97 97 - 106 mmol/L   CO2 24 18 - 29 mmol/L   Calcium 9.2 8.6 - 10.2 mg/dL   Total Protein 7.2 6.0 - 8.5 g/dL   Albumin 4.1 3.6 - 4.8 g/dL   Globulin, Total 3.1 1.5 - 4.5 g/dL   Albumin/Globulin Ratio 1.3 1.1 - 2.5   Bilirubin Total 0.6 0.0 - 1.2 mg/dL  Alkaline Phosphatase 88 39 - 117 IU/L   AST 22 0 - 40 IU/L   ALT 21 0 - 44 IU/L  Lipid Panel w/o Chol/HDL Ratio  Result Value Ref Range   Cholesterol, Total CANCELED mg/dL   Triglycerides CANCELED    HDL CANCELED   Lipid Panel w/o Chol/HDL Ratio  Result Value Ref Range   Cholesterol, Total 122 100 - 199 mg/dL   Triglycerides 111 0 - 149 mg/dL   HDL 52 >39 mg/dL   VLDL Cholesterol Cal 22 5 - 40 mg/dL   LDL Calculated 48 0 - 99 mg/dL  Lipid Panel Piccolo, Waived  Result Value Ref Range   Cholesterol Piccolo, Waived CANCELED mg/dL   HDL Chol Piccolo, Waived CANCELED    Triglycerides Piccolo,Waived CANCELED       Assessment & Plan:   Problem List Items Addressed This Visit      Unprioritized   Allergic conjunctivitis, acute, bilateral   Foot ulcer, right (HCC)    No necrosis.  Will keep covered with antibiotic ointment an bandaid.  Seeing podiatry next week.         Other Visit Diagnoses   None.      Follow up plan: No Follow-up on  file.

## 2015-12-27 NOTE — Assessment & Plan Note (Signed)
No necrosis.  Will keep covered with antibiotic ointment an bandaid.  Seeing podiatry next week.

## 2016-01-04 ENCOUNTER — Encounter: Payer: Self-pay | Admitting: Podiatry

## 2016-01-04 ENCOUNTER — Telehealth: Payer: Self-pay | Admitting: Unknown Physician Specialty

## 2016-01-04 ENCOUNTER — Ambulatory Visit (INDEPENDENT_AMBULATORY_CARE_PROVIDER_SITE_OTHER): Payer: Medicare Other | Admitting: Podiatry

## 2016-01-04 DIAGNOSIS — L97511 Non-pressure chronic ulcer of other part of right foot limited to breakdown of skin: Secondary | ICD-10-CM

## 2016-01-04 NOTE — Telephone Encounter (Signed)
Pt called stated his eyes are not any better. Pt would like Malachy Mood to send something to the pharmacy for him. Pharm is [facility] in [place]. Thanks.

## 2016-01-04 NOTE — Progress Notes (Signed)
   Subjective:    Patient ID: Travis Haynes, male    DOB: 06-28-1945, 70 y.o.   MRN: [id number]  HPI  70 year old male presents the office today for concerns of the spot on the right big toe which is been on them for about 2 months and is tender upon palpation. Denies any redness or drainage coming from the area and denies any swelling. No recent treatment. Denies any claudication symptoms. No other complaints at this time.   Review of Systems  HENT: Positive for trouble swallowing.   Eyes: Positive for itching.  Musculoskeletal: Positive for gait problem.  Skin: Positive for rash.       Change in nails       Objective:   Physical Exam General: AAO x3, NAD  Dermatological: On the right hallux is a superficial granular wound measuring 0.3 x 0.3 cm. There is no surrounding erythema, ascending cellulitis. There is no fluctuance or crepitus is no malodor. Mild hyperkeratotic periwound. There is no probing, undermining or tunneling. No signs of infection. No other open lesions are identified.  Vascular: Dorsalis Pedis artery and Posterior Tibial artery pedal pulses are palpable with immedate capillary fill time. There is no pain with calf compression, swelling, warmth, erythema.   Neruologic: Grossly intact via light touch bilateral. Vibratory intact via tuning fork bilateral. Protective threshold with Semmes Wienstein monofilament intact to all pedal sites bilateral.   Musculoskeletal: No gross boney pedal deformities bilateral. No pain, crepitus, or limitation noted with foot and ankle range of motion bilateral. Muscular strength 5/5 in all groups tested bilateral.  Gait: Unassisted, Nonantalgic.     Assessment & Plan:  70 year old superficial noninfected wound right big toe  -Treatment options discussed including all alternatives, risks, and complications -Etiology of symptoms were discussed -Wound was debrided to granular tissue. Continue antibiotic ointment dressing daily. Monitor  for any signs or symptoms of infection. Encouraged patient to call the office immediately should any occur. Offloading pads dispensed.  -Follow-up in 3 weeks or sooner if needed.   *X-ray foot and appointment of wound is still present.   Celesta Gentile, DPM

## 2016-01-05 MED ORDER — POLYMYXIN B-TRIMETHOPRIM 10000-0.1 UNIT/ML-% OP SOLN: 1.0000 [drp] | OPHTHALMIC | 0 refills | Status: DC

## 2016-01-05 NOTE — Telephone Encounter (Signed)
Called and left patient a VM letting him know that rx was sent in for him.

## 2016-01-05 NOTE — Telephone Encounter (Signed)
Routing to provider  

## 2016-01-10 DIAGNOSIS — L97511 Non-pressure chronic ulcer of other part of right foot limited to breakdown of skin: Secondary | ICD-10-CM | POA: Insufficient documentation

## 2016-01-15 DIAGNOSIS — M069 Rheumatoid arthritis, unspecified: Secondary | ICD-10-CM | POA: Diagnosis not present

## 2016-01-15 DIAGNOSIS — Z791 Long term (current) use of non-steroidal anti-inflammatories (NSAID): Secondary | ICD-10-CM | POA: Diagnosis not present

## 2016-01-15 DIAGNOSIS — Z981 Arthrodesis status: Secondary | ICD-10-CM | POA: Diagnosis not present

## 2016-01-15 DIAGNOSIS — M1990 Unspecified osteoarthritis, unspecified site: Secondary | ICD-10-CM | POA: Diagnosis not present

## 2016-01-15 DIAGNOSIS — Z8572 Personal history of non-Hodgkin lymphomas: Secondary | ICD-10-CM | POA: Diagnosis not present

## 2016-01-15 DIAGNOSIS — Z9221 Personal history of antineoplastic chemotherapy: Secondary | ICD-10-CM | POA: Diagnosis not present

## 2016-01-15 DIAGNOSIS — Z7952 Long term (current) use of systemic steroids: Secondary | ICD-10-CM | POA: Diagnosis not present

## 2016-01-15 DIAGNOSIS — Z7951 Long term (current) use of inhaled steroids: Secondary | ICD-10-CM | POA: Diagnosis not present

## 2016-01-15 DIAGNOSIS — I10 Essential (primary) hypertension: Secondary | ICD-10-CM | POA: Diagnosis not present

## 2016-01-15 DIAGNOSIS — Z79899 Other long term (current) drug therapy: Secondary | ICD-10-CM | POA: Diagnosis not present

## 2016-01-15 DIAGNOSIS — Z87891 Personal history of nicotine dependence: Secondary | ICD-10-CM | POA: Diagnosis not present

## 2016-01-19 ENCOUNTER — Encounter: Payer: Self-pay | Admitting: Unknown Physician Specialty

## 2016-01-19 ENCOUNTER — Ambulatory Visit (INDEPENDENT_AMBULATORY_CARE_PROVIDER_SITE_OTHER): Payer: Medicare Other | Admitting: Unknown Physician Specialty

## 2016-01-19 VITALS — BP 119/68 | HR 96 | Temp 98.5°F | Wt 159.4 lb

## 2016-01-19 DIAGNOSIS — Z0000 Encounter for general adult medical examination without abnormal findings: Secondary | ICD-10-CM

## 2016-01-19 DIAGNOSIS — I10 Essential (primary) hypertension: Secondary | ICD-10-CM | POA: Diagnosis not present

## 2016-01-19 DIAGNOSIS — E7800 Pure hypercholesterolemia, unspecified: Secondary | ICD-10-CM | POA: Diagnosis not present

## 2016-01-19 MED ORDER — HYDROCHLOROTHIAZIDE 25 MG PO TABS: 25.0000 mg | ORAL_TABLET | Freq: Every day | ORAL | 1 refills | Status: DC

## 2016-01-19 MED ORDER — TRAMADOL HCL 50 MG PO TABS: 50.0000 mg | ORAL_TABLET | Freq: Three times a day (TID) | ORAL | 5 refills | Status: DC | PRN

## 2016-01-19 MED ORDER — OMEPRAZOLE 20 MG PO CPDR: 40.0000 mg | DELAYED_RELEASE_CAPSULE | Freq: Every day | ORAL | 3 refills | Status: DC

## 2016-01-19 MED ORDER — ATORVASTATIN CALCIUM 10 MG PO TABS: 10.0000 mg | ORAL_TABLET | Freq: Every day | ORAL | 3 refills | Status: DC

## 2016-01-19 NOTE — Progress Notes (Signed)
BP 119/68 (BP Location: Left Arm, Patient Position: Sitting, Cuff Size: Normal)   Pulse 96   Temp 98.5 F (36.9 C)   Wt 159 lb 6.4 oz (72.3 kg)   SpO2 96%   BMI 24.38 kg/m    Subjective:    Patient ID: Travis Haynes, male    DOB: Jul 06, 1945, 70 y.o.   MRN: [id number]  HPI: Travis Haynes is a 70 y.o. male  Chief Complaint  Patient presents with  . Hypertension   Hypertension:  Using medications without difficulty Average home BP SBP 120's  Using medication without problems or lightheadedness No chest pain with exertion or shortness of breath Edema: States his feet swell.  This has gotten worse in the last year.    Elevated Cholesterol: Non fasting today Using medications without problems Muscle aches: left calve that is long-standing Diet compliance: good Exercise: none due to chronic pain  Pain: Present dose: On Tramadol.  The morphine equivalent dose is 30 mgs.  Takes Tylenol in the AM (if he is going somewhere that day). Will take the Aleve about 2 pm and once about midnight. Takes Tramadol 50 mg 3 times daily. Tramadol useful at night for muscle spasm. Prednisone helps a lot with pain.  Pain control status:  controlled  H6 Location:  H1 all over because of arthritis. Can be bad in hands when using cane.  Quality:  sore H2 Current pain level:  mild H3 Aggravating factors:   worse in the morning  H7 Benefit from narcotic medications:  yes  H6 What Activities task can be accomplished with current medication?  sleeps better  Interested in weaning off narcotics:  no H6    Pt states he would like a PSA blood test  Relevant past medical, surgical, family and social history reviewed and updated as indicated. Interim medical history since our last visit reviewed. Allergies and medications reviewed and updated.  Review of Systems  HENT: Positive for congestion.        Itchy nose and sometimes sore.   Eyes: Negative.   Respiratory: Negative.   Cardiovascular:  Negative.   Gastrointestinal: Negative.   Endocrine: Negative.   Genitourinary: Negative.   Musculoskeletal: Positive for joint swelling.  Skin: Negative.   Neurological: Negative.   Psychiatric/Behavioral: Negative.     Per HPI unless specifically indicated above     Objective:    BP 119/68 (BP Location: Left Arm, Patient Position: Sitting, Cuff Size: Normal)   Pulse 96   Temp 98.5 F (36.9 C)   Wt 159 lb 6.4 oz (72.3 kg)   SpO2 96%   BMI 24.38 kg/m   Wt Readings from Last 3 Encounters:  01/19/16 159 lb 6.4 oz (72.3 kg)  12/27/15 159 lb 6.4 oz (72.3 kg)  08/22/15 158 lb 9.6 oz (71.9 kg)    Physical Exam  Constitutional: He is oriented to person, place, and time. He appears well-developed and well-nourished. No distress.  HENT:  Head: Normocephalic and atraumatic.  Eyes: Conjunctivae and lids are normal. Right eye exhibits no discharge. Left eye exhibits no discharge. No scleral icterus.  Cardiovascular: Normal rate, regular rhythm and normal heart sounds.   Pulmonary/Chest: Effort normal and breath sounds normal. No respiratory distress.  Abdominal: Normal appearance. He exhibits no distension. There is no splenomegaly or hepatomegaly. There is no tenderness.  Musculoskeletal: Normal range of motion.  Neurological: He is alert and oriented to person, place, and time.  Skin: Skin is intact. No rash noted. No  pallor.  Psychiatric: He has a normal mood and affect. His behavior is normal. Judgment and thought content normal.   Labs from [facility] reviewed    Assessment & Plan:   Problem List Items Addressed This Visit      Unprioritized   BP (high blood pressure)   Relevant Medications   atorvastatin (LIPITOR) 10 MG tablet   hydrochlorothiazide (HYDRODIURIL) 25 MG tablet   Other Relevant Orders   Comprehensive metabolic panel   Hyperlipidemia - Primary   Relevant Medications   atorvastatin (LIPITOR) 10 MG tablet   hydrochlorothiazide (HYDRODIURIL) 25 MG tablet    Other Relevant Orders   Lipid Panel w/o Chol/HDL Ratio    Other Visit Diagnoses    Routine general medical examination at a health care facility       Relevant Orders   PSA       Follow up plan: Return for physicakl.

## 2016-01-20 LAB — COMPREHENSIVE METABOLIC PANEL
A/G RATIO: 1.4 (ref 1.2–2.2)
ALBUMIN: 4.1 g/dL (ref 3.6–4.8)
ALT: 9 IU/L (ref 0–44)
AST: 13 IU/L (ref 0–40)
Alkaline Phosphatase: 68 IU/L (ref 39–117)
BUN / CREAT RATIO: 16 (ref 10–24)
BUN: 12 mg/dL (ref 8–27)
Bilirubin Total: 0.5 mg/dL (ref 0.0–1.2)
CALCIUM: 9.2 mg/dL (ref 8.6–10.2)
CO2: 29 mmol/L (ref 18–29)
Chloride: 93 mmol/L — ABNORMAL LOW (ref 96–106)
Creatinine, Ser: 0.77 mg/dL (ref 0.76–1.27)
GFR, EST AFRICAN AMERICAN: 107 mL/min/{1.73_m2} (ref >59)
GFR, EST NON AFRICAN AMERICAN: 93 mL/min/{1.73_m2} (ref >59)
GLOBULIN, TOTAL: 3 g/dL (ref 1.5–4.5)
Glucose: 95 mg/dL (ref 65–99)
POTASSIUM: 3.4 mmol/L — AB (ref 3.5–5.2)
SODIUM: 138 mmol/L (ref 134–144)
Total Protein: 7.1 g/dL (ref 6.0–8.5)

## 2016-01-20 LAB — LIPID PANEL W/O CHOL/HDL RATIO
CHOLESTEROL TOTAL: 132 mg/dL (ref 100–199)
HDL: 57 mg/dL (ref >39)
LDL Calculated: 51 mg/dL (ref 0–99)
Triglycerides: 119 mg/dL (ref 0–149)
VLDL CHOLESTEROL CAL: 24 mg/dL (ref 5–40)

## 2016-01-20 LAB — PSA: Prostate Specific Ag, Serum: 0.4 ng/mL (ref 0.0–4.0)

## 2016-01-22 ENCOUNTER — Encounter: Payer: Self-pay | Admitting: Unknown Physician Specialty

## 2016-01-25 ENCOUNTER — Encounter: Payer: Self-pay | Admitting: Podiatry

## 2016-01-25 ENCOUNTER — Ambulatory Visit (INDEPENDENT_AMBULATORY_CARE_PROVIDER_SITE_OTHER): Payer: Medicare Other | Admitting: Podiatry

## 2016-01-25 DIAGNOSIS — L97511 Non-pressure chronic ulcer of other part of right foot limited to breakdown of skin: Secondary | ICD-10-CM

## 2016-01-29 DIAGNOSIS — Z7952 Long term (current) use of systemic steroids: Secondary | ICD-10-CM | POA: Diagnosis not present

## 2016-02-01 NOTE — Progress Notes (Signed)
Subjective: 70 year old male presents the office they for follow-up evaluation of wound to the right toe. He states is cut better since last appointment. He's been using antibiotic ointment on the area daily. He denies any drainage or pus or any increase in swelling or redness to his toe. Denies any other open sores. Denies any systemic complaints such as fevers, chills, nausea, vomiting. No acute changes since last appointment, and no other complaints at this time.   Objective: AAO x3, NAD DP/PT pulses palpable bilaterally, CRT less than 3 seconds On the plantar aspect the right hallux is a superficial granular wound measuring 0.2 x 0.2 cm there is no probing, undermining or tunneling in the wound appears to be superficial. There is no surrounding erythema, ascending cellulitis, fluctuance, crepitus, malodor. There is no edema to the toe. No other open lesions or pre-ulcerative lesions are identified. Extension of the hallux is present. No pain with calf compression, swelling, warmth, erythema  Assessment: Ulceration right plantar hallux with evidence of healing and no signs of infection  Plan: -All treatment options discussed with the patient including all alternatives, risks, complications.  -Wound was clean today. Continue antibiotic ointment dressing changes daily. There is no clinical signs of infection however monitor closely for any signs. Further offloading pads were dispensed. -Follow-up in 4 weeks of the wound does not heal or sooner if any issues are to arise. -Patient encouraged to call the office with any questions, concerns, change in symptoms.   Celesta Gentile, DPM

## 2016-02-22 ENCOUNTER — Ambulatory Visit: Payer: Medicare Other | Admitting: Podiatry

## 2016-02-27 ENCOUNTER — Telehealth: Payer: Self-pay | Admitting: Unknown Physician Specialty

## 2016-02-27 NOTE — Telephone Encounter (Signed)
An eye issue was mentioned in the [facility] note from 12/27/15.

## 2016-02-27 NOTE — Telephone Encounter (Signed)
Routing to provider  

## 2016-02-27 NOTE — Telephone Encounter (Signed)
There are no notes about his eyes that I can see- can you find out what he's talking about?

## 2016-02-27 NOTE — Telephone Encounter (Signed)
Please get him in for a follow up.

## 2016-02-27 NOTE — Telephone Encounter (Signed)
Pts eyes are still not better and he would like something sent to [facility]

## 2016-02-28 ENCOUNTER — Ambulatory Visit (INDEPENDENT_AMBULATORY_CARE_PROVIDER_SITE_OTHER): Payer: Medicare Other | Admitting: Family Medicine

## 2016-02-28 ENCOUNTER — Encounter: Payer: Self-pay | Admitting: Family Medicine

## 2016-02-28 VITALS — BP 145/77 | HR 90 | Temp 98.6°F | Wt 162.0 lb

## 2016-02-28 DIAGNOSIS — H578 Other specified disorders of eye and adnexa: Secondary | ICD-10-CM | POA: Diagnosis not present

## 2016-02-28 DIAGNOSIS — H5789 Other specified disorders of eye and adnexa: Secondary | ICD-10-CM

## 2016-02-28 MED ORDER — ERYTHROMYCIN 5 MG/GM OP OINT: 1.0000 "application " | TOPICAL_OINTMENT | Freq: Every day | OPHTHALMIC | 0 refills | Status: DC

## 2016-02-28 NOTE — Progress Notes (Signed)
   BP (!) 145/77   Pulse 90   Temp 98.6 F (37 C)   Wt 162 lb (73.5 kg)   SpO2 98%   BMI 24.78 kg/m    Subjective:    Patient ID: Travis Haynes, male    DOB: 12/04/1945, 70 y.o.   MRN: [id number]  HPI: Travis Haynes is a 70 y.o. male  Chief Complaint  Patient presents with  . Eye Pain    bilateral x couple months, they water, feel worse at night and in the mornings they feel scratchy.    Patient presents with 2-3 months of b/l eye irritation and drainage. Has tried allergy drops and polytrim drops. Some relief with the antibiotic drops. States eyesight is intact, and his eyes do not feel dry or gritty. No redness or itching. Denies HAs, dizziness, N/V, fevers.   Relevant past medical, surgical, family and social history reviewed and updated as indicated. Interim medical history since our last visit reviewed. Allergies and medications reviewed and updated.  Review of Systems  Constitutional: Negative.   HENT: Negative.   Eyes: Positive for discharge.  Respiratory: Negative.   Cardiovascular: Negative.   Gastrointestinal: Negative.   Genitourinary: Negative.   Musculoskeletal: Negative.   Skin: Negative.   Neurological: Negative.   Psychiatric/Behavioral: Negative.     Per HPI unless specifically indicated above     Objective:    BP (!) 145/77   Pulse 90   Temp 98.6 F (37 C)   Wt 162 lb (73.5 kg)   SpO2 98%   BMI 24.78 kg/m   Wt Readings from Last 3 Encounters:  02/28/16 162 lb (73.5 kg)  01/19/16 159 lb 6.4 oz (72.3 kg)  12/27/15 159 lb 6.4 oz (72.3 kg)    Physical Exam  Constitutional: He is oriented to person, place, and time. He appears well-developed and well-nourished. No distress.  HENT:  Head: Atraumatic.  Eyes: Conjunctivae and EOM are normal. Pupils are equal, round, and reactive to light. Right eye exhibits no discharge. Left eye exhibits no discharge. No scleral icterus.  Neck: Normal range of motion. Neck supple.  Cardiovascular: Normal rate  and normal heart sounds.   Pulmonary/Chest: Effort normal. No respiratory distress.  Musculoskeletal: Normal range of motion.  Lymphadenopathy:    He has no cervical adenopathy.  Neurological: He is alert and oriented to person, place, and time.  Skin: Skin is warm and dry.  Psychiatric: He has a normal mood and affect. His behavior is normal.  Nursing note and vitals reviewed.   Visual Acuity: Left eye - 20/50 Right eye - 20/30      Assessment & Plan:   Problem List Items Addressed This Visit    None    Visit Diagnoses    Irritation of both eyes    -  Primary   Nonspecific sxs, benign exam. Will send more ointment as he said this provides some relief, await consultation with [facility]       Follow up plan: Return if symptoms worsen or fail to improve.

## 2016-02-28 NOTE — Patient Instructions (Signed)
[facility]  [phone]

## 2016-03-06 DIAGNOSIS — H0259 Other disorders affecting eyelid function: Secondary | ICD-10-CM | POA: Diagnosis not present

## 2016-03-20 ENCOUNTER — Other Ambulatory Visit: Payer: Self-pay

## 2016-03-21 MED ORDER — FLUTICASONE PROPIONATE 50 MCG/ACT NA SUSP: 2.0000 | Freq: Every day | NASAL | 6 refills | Status: DC

## 2016-03-29 ENCOUNTER — Telehealth: Payer: Self-pay

## 2016-03-29 MED ORDER — FLUTICASONE PROPIONATE 50 MCG/ACT NA SUSP: 2.0000 | Freq: Every day | NASAL | 6 refills | Status: DC

## 2016-03-29 NOTE — Telephone Encounter (Signed)
Received refill request for flonase, current rx says print so can we please change it to normal? Pharmacy is [facility].

## 2016-05-31 ENCOUNTER — Telehealth: Payer: Self-pay | Admitting: Unknown Physician Specialty

## 2016-05-31 NOTE — Telephone Encounter (Signed)
Patient dropped off the DMV form for [facility] to sign for him.  He will pick up when she completes.  Thanks

## 2016-05-31 NOTE — Telephone Encounter (Signed)
Called and let patient know that form was signed and ready for pick up.

## 2016-06-24 DIAGNOSIS — Z9221 Personal history of antineoplastic chemotherapy: Secondary | ICD-10-CM | POA: Diagnosis not present

## 2016-06-24 DIAGNOSIS — Z981 Arthrodesis status: Secondary | ICD-10-CM | POA: Diagnosis not present

## 2016-06-24 DIAGNOSIS — Z888 Allergy status to other drugs, medicaments and biological substances status: Secondary | ICD-10-CM | POA: Diagnosis not present

## 2016-06-24 DIAGNOSIS — M12871 Other specific arthropathies, not elsewhere classified, right ankle and foot: Secondary | ICD-10-CM | POA: Diagnosis not present

## 2016-06-24 DIAGNOSIS — Z87891 Personal history of nicotine dependence: Secondary | ICD-10-CM | POA: Diagnosis not present

## 2016-06-24 DIAGNOSIS — Z7951 Long term (current) use of inhaled steroids: Secondary | ICD-10-CM | POA: Diagnosis not present

## 2016-06-24 DIAGNOSIS — C8333 Diffuse large B-cell lymphoma, intra-abdominal lymph nodes: Secondary | ICD-10-CM | POA: Diagnosis not present

## 2016-06-24 DIAGNOSIS — Z7952 Long term (current) use of systemic steroids: Secondary | ICD-10-CM | POA: Diagnosis not present

## 2016-06-24 DIAGNOSIS — Z79891 Long term (current) use of opiate analgesic: Secondary | ICD-10-CM | POA: Diagnosis not present

## 2016-06-24 DIAGNOSIS — M069 Rheumatoid arthritis, unspecified: Secondary | ICD-10-CM | POA: Diagnosis not present

## 2016-06-24 DIAGNOSIS — I10 Essential (primary) hypertension: Secondary | ICD-10-CM | POA: Diagnosis not present

## 2016-06-24 DIAGNOSIS — M12872 Other specific arthropathies, not elsewhere classified, left ankle and foot: Secondary | ICD-10-CM | POA: Diagnosis not present

## 2016-06-24 DIAGNOSIS — J029 Acute pharyngitis, unspecified: Secondary | ICD-10-CM | POA: Diagnosis not present

## 2016-06-24 DIAGNOSIS — M154 Erosive (osteo)arthritis: Secondary | ICD-10-CM | POA: Diagnosis not present

## 2016-06-25 ENCOUNTER — Encounter: Payer: Self-pay | Admitting: Unknown Physician Specialty

## 2016-06-25 ENCOUNTER — Ambulatory Visit (INDEPENDENT_AMBULATORY_CARE_PROVIDER_SITE_OTHER): Payer: Medicare Other | Admitting: Unknown Physician Specialty

## 2016-06-25 VITALS — BP 154/91 | HR 96 | Temp 98.2°F | Wt 164.8 lb

## 2016-06-25 DIAGNOSIS — J029 Acute pharyngitis, unspecified: Secondary | ICD-10-CM | POA: Diagnosis not present

## 2016-06-25 NOTE — Progress Notes (Signed)
BP (!) 154/91 (BP Location: Left Arm, Cuff Size: Normal)   Pulse 96   Temp 98.2 F (36.8 C)   Wt 164 lb 12.8 oz (74.8 kg)   SpO2 97%   BMI 25.21 kg/m    Subjective:    Patient ID: Travis Haynes, male    DOB: [birth year], 71 y.o.   MRN: [id number]  HPI: KENNETT SYMES is a 71 y.o. male  Chief Complaint  Patient presents with  . Sore Throat    pt states he has had sore throat for the past couple of days    Sore Throat   This is a new problem. Episode onset: 3 days. The problem has been unchanged. Neither side of throat is experiencing more pain than the other. There has been no fever. Associated symptoms include coughing. Pertinent negatives include no congestion or headaches. He has had no exposure to strep or mono. He has tried nothing for the symptoms.     Relevant past medical, surgical, family and social history reviewed and updated as indicated. Interim medical history since our last visit reviewed. Allergies and medications reviewed and updated.  Review of Systems  HENT: Negative for congestion.   Respiratory: Positive for cough.   Neurological: Negative for headaches.    Per HPI unless specifically indicated above     Objective:    BP (!) 154/91 (BP Location: Left Arm, Cuff Size: Normal)   Pulse 96   Temp 98.2 F (36.8 C)   Wt 164 lb 12.8 oz (74.8 kg)   SpO2 97%   BMI 25.21 kg/m   Wt Readings from Last 3 Encounters:  06/25/16 164 lb 12.8 oz (74.8 kg)  02/28/16 162 lb (73.5 kg)  01/19/16 159 lb 6.4 oz (72.3 kg)    Physical Exam  Constitutional: He is oriented to person, place, and time. He appears well-developed and well-nourished. No distress.  HENT:  Head: Normocephalic and atraumatic.  Right Ear: Tympanic membrane and ear canal normal.  Left Ear: Tympanic membrane and ear canal normal.  Nose: Rhinorrhea present. Right sinus exhibits no maxillary sinus tenderness and no frontal sinus tenderness. Left sinus exhibits no maxillary sinus tenderness and  no frontal sinus tenderness.  Mouth/Throat: Uvula is midline.  Small ulcer on left side  Eyes: Conjunctivae and lids are normal. Right eye exhibits no discharge. Left eye exhibits no discharge. No scleral icterus.  Neck: Neck supple.  Cardiovascular: Normal rate, regular rhythm and normal heart sounds.   Pulmonary/Chest: Effort normal and breath sounds normal. No respiratory distress.  Abdominal: Normal appearance. There is no splenomegaly or hepatomegaly.  Musculoskeletal: Normal range of motion.  Neurological: He is alert and oriented to person, place, and time.  Skin: Skin is warm, dry and intact. No rash noted. No pallor.  Psychiatric: He has a normal mood and affect. His behavior is normal. Judgment and thought content normal.  Nursing note and vitals reviewed.   Results for orders placed or performed in visit on 01/19/16  Lipid Panel w/o Chol/HDL Ratio  Result Value Ref Range   Cholesterol, Total 132 100 - 199 mg/dL   Triglycerides 119 0 - 149 mg/dL   HDL 57 >39 mg/dL   VLDL Cholesterol Cal 24 5 - 40 mg/dL   LDL Calculated 51 0 - 99 mg/dL  Comprehensive metabolic panel  Result Value Ref Range   Glucose 95 65 - 99 mg/dL   BUN 12 8 - 27 mg/dL   Creatinine, Ser 0.77 0.76 - 1.27 mg/dL  GFR calc non Af Amer 93 >59 mL/min/1.73   GFR calc Af Amer 107 >59 mL/min/1.73   BUN/Creatinine Ratio 16 10 - 24   Sodium 138 134 - 144 mmol/L   Potassium 3.4 (L) 3.5 - 5.2 mmol/L   Chloride 93 (L) 96 - 106 mmol/L   CO2 29 18 - 29 mmol/L   Calcium 9.2 8.6 - 10.2 mg/dL   Total Protein 7.1 6.0 - 8.5 g/dL   Albumin 4.1 3.6 - 4.8 g/dL   Globulin, Total 3.0 1.5 - 4.5 g/dL   Albumin/Globulin Ratio 1.4 1.2 - 2.2   Bilirubin Total 0.5 0.0 - 1.2 mg/dL   Alkaline Phosphatase 68 39 - 117 IU/L   AST 13 0 - 40 IU/L   ALT 9 0 - 44 IU/L  PSA  Result Value Ref Range   Prostate Specific Ag, Serum 0.4 0.0 - 4.0 ng/mL      Assessment & Plan:   Problem List Items Addressed This Visit    None      Visit Diagnoses    Sore throat    -  Primary   Strep negative.  Aphthous ulcer.  Salt water gargles.   Relevant Orders   Rapid strep screen (not at [facility])       Follow up plan: Return if symptoms worsen or fail to improve.

## 2016-06-28 LAB — CULTURE, GROUP A STREP: STREP A CULTURE: NEGATIVE

## 2016-06-28 LAB — RAPID STREP SCREEN (MED CTR MEBANE ONLY): STREP GP A AG, IA W/REFLEX: NEGATIVE

## 2016-07-22 ENCOUNTER — Encounter: Payer: Self-pay | Admitting: Unknown Physician Specialty

## 2016-07-22 ENCOUNTER — Ambulatory Visit (INDEPENDENT_AMBULATORY_CARE_PROVIDER_SITE_OTHER): Payer: Medicare Other | Admitting: Unknown Physician Specialty

## 2016-07-22 VITALS — BP 127/80 | HR 123 | Temp 98.6°F | Ht 67.6 in | Wt 166.5 lb

## 2016-07-22 DIAGNOSIS — J392 Other diseases of pharynx: Secondary | ICD-10-CM | POA: Diagnosis not present

## 2016-07-22 DIAGNOSIS — K219 Gastro-esophageal reflux disease without esophagitis: Secondary | ICD-10-CM | POA: Diagnosis not present

## 2016-07-22 DIAGNOSIS — Z7189 Other specified counseling: Secondary | ICD-10-CM | POA: Diagnosis not present

## 2016-07-22 DIAGNOSIS — M069 Rheumatoid arthritis, unspecified: Secondary | ICD-10-CM | POA: Diagnosis not present

## 2016-07-22 DIAGNOSIS — G894 Chronic pain syndrome: Secondary | ICD-10-CM

## 2016-07-22 DIAGNOSIS — I10 Essential (primary) hypertension: Secondary | ICD-10-CM

## 2016-07-22 DIAGNOSIS — Z0000 Encounter for general adult medical examination without abnormal findings: Secondary | ICD-10-CM

## 2016-07-22 MED ORDER — ASPIRIN EC 81 MG PO TBEC: 81.0000 mg | DELAYED_RELEASE_TABLET | Freq: Every day | ORAL | 12 refills | Status: AC

## 2016-07-22 NOTE — Assessment & Plan Note (Signed)
Stable, continue present medications.   

## 2016-07-22 NOTE — Assessment & Plan Note (Signed)
Due to severe arthritis.  Refusing Gabapentin.  Taking Tramdol TID.  Plans to discuss more with rheumatology.  Offered pain clinic visit

## 2016-07-22 NOTE — Assessment & Plan Note (Signed)
A voluntary discussion about advance care planning including the explanation and discussion of advance directives was extensively discussed  with the patient.  Explanation about the health care proxy and Living will was reviewed and packet with forms with explanation of how to fill them out was given.  During this discussion, the patient was able to identify a health care proxy as his son Kaydin and does not plan to fill out the paperwork required but his son might.  Patient was offered a separate [facility] visit for further assistance with forms.  He does not want to be put on any machines

## 2016-07-22 NOTE — Assessment & Plan Note (Signed)
Per rheumatology 

## 2016-07-22 NOTE — Progress Notes (Signed)
BP 127/80   Pulse (!) 123   Temp 98.6 F (37 C)   Ht 5' 7.6" (1.717 m)   Wt 166 lb 8 oz (75.5 kg)   SpO2 95%   BMI 25.62 kg/m    Subjective:    Patient ID: Travis Haynes, male    DOB: 1946-02-16, 71 y.o.   MRN: [id number]  HPI: Travis Haynes is a 71 y.o. male  Chief Complaint  Patient presents with  . 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  Takes Tramadol 3 times/day.  Needs a refill today.  Pt states he has trouble with pain and has discussed with the rheumatologists.  States he has trouble with sleeping.  Tramadol at night is not helped.  I have prescribed Gabapentin and pt did not take.    Hypertension Using medications without difficulty Average home BPs 115-117   No problems or lightheadedness No chest pain with exertion or shortness of breath No Edema  Hyperlipidemia Using medications without problems: No Muscle aches  Diet compliance:Eats what he wants Exercise: does exercises in bed.    GERD This is stable on present medication  Refused PHQ 9, MDQ and functional health assessment.      Family History  Problem Relation Age of Onset  . Heart disease Mother     heart attack  . Arthritis Mother   . Arthritis Father    Social History   Social History  . Marital status: Married    Spouse name: Travis Haynes  . Number of children: Travis Haynes  . Years of education: Travis Haynes   Occupational History  . Not on file.   Social History Main Topics  . Smoking status: Former Research scientist (life sciences)  . Smokeless tobacco: Never Used  . Alcohol use No  . Drug use: No  . Sexual activity: Not on file     Comment: pt did not want to answer   Other Topics Concern  . Not on file   Social History Narrative  . No narrative on file   Past Medical History:  Diagnosis Date  . Arthritis   . Barrett's esophagus   . Chronic back pain   . Chronic pain syndrome   . Coronary artery disease   . GERD (gastroesophageal reflux disease)   . Hemorrhoids   . Hernia of abdominal  cavity   . Hyperlipidemia   . Hypertension   . Lumbago    Past Surgical History:  Procedure Laterality Date  . APPENDECTOMY    . BACK SURGERY    . ESOPHAGOGASTRODUODENOSCOPY (EGD) WITH PROPOFOL Travis Haynes 12/05/2014   Procedure: ESOPHAGOGASTRODUODENOSCOPY (EGD) WITH PROPOFOL;  Surgeon: Hulen Luster, MD;  Location: [facility] ENDOSCOPY;  Service: Gastroenterology;  Laterality: Travis Haynes;  . intestine repair    . NECK SURGERY    . Reduction masseter muscle/bone extraoral       Relevant past medical, surgical, family and social history reviewed and updated as indicated. Interim medical history since our last visit reviewed. Allergies and medications reviewed and updated.  Review of Systems  HENT:       Still white area on back of throat    Per HPI unless specifically indicated above     Objective:    BP 127/80   Pulse (!) 123   Temp 98.6 F (37 C)   Ht 5' 7.6" (1.717 m)   Wt 166 lb 8 oz (75.5 kg)   SpO2 95%   BMI 25.62 kg/m   Wt Readings from Last 3  Encounters:  07/22/16 166 lb 8 oz (75.5 kg)  06/25/16 164 lb 12.8 oz (74.8 kg)  02/28/16 162 lb (73.5 kg)    Physical Exam  Constitutional: He is oriented to person, place, and time. He appears well-developed and well-nourished.  HENT:  Head: Normocephalic.  Right Ear: Tympanic membrane, external ear and ear canal normal.  Left Ear: Tympanic membrane, external ear and ear canal normal.  Mouth/Throat: Uvula is midline, oropharynx is clear and moist and mucous membranes are normal.  White smooth lesion left side of throat  Eyes: Pupils are equal, round, and reactive to light.  Cardiovascular: Normal rate, regular rhythm and normal heart sounds.  Exam reveals no gallop and no friction rub.   No murmur heard. Pulmonary/Chest: Effort normal and breath sounds normal. No respiratory distress.  Abdominal: Soft. Bowel sounds are normal. He exhibits no distension. There is no tenderness.  Musculoskeletal: Normal range of motion.  Neurological: He is  alert and oriented to person, place, and time. He has normal reflexes.  Skin: Skin is warm and dry.  Psychiatric: He has a normal mood and affect. His behavior is normal. Judgment and thought content normal.    Results for orders placed or performed in visit on 06/25/16  Rapid strep screen (not at [facility])  Result Value Ref Range   Strep Gp A Ag, IA W/Reflex Negative Negative  Culture, Group A Strep  Result Value Ref Range   Strep A Culture Negative       Assessment & Plan:   Problem List Items Addressed This Visit      Unprioritized   Advanced care planning/counseling discussion    A voluntary discussion about advance care planning including the explanation and discussion of advance directives was extensively discussed  with the patient.  Explanation about the health care proxy and Living will was reviewed and packet with forms with explanation of how to fill them out was given.  During this discussion, the patient was able to identify a health care proxy as his son Benson and does not plan to fill out the paperwork required but his son might.  Patient was offered a separate [place] visit for further assistance with forms.  He does not want to be put on any machines        Arthritis or polyarthritis, rheumatoid [facility])    Per rheumatology      Relevant Medications   aspirin EC 81 MG tablet   BP (high blood pressure)    Stable, continue present medications.        Relevant Medications   aspirin EC 81 MG tablet   Chronic pain    Due to severe arthritis.  Refusing Gabapentin.  Taking Tramdol TID.  Plans to discuss more with rheumatology.  Offered pain clinic visit      Relevant Medications   aspirin EC 81 MG tablet   GERD (gastroesophageal reflux disease)    Stable, continue present medications.         Other Visit Diagnoses    Annual physical exam    -  Primary   Lesion of throat       I think it is a mucoid cyst.  Refer to ENT for further evaluation    Relevant Orders   Ambulatory referral to ENT      Follow up plan: Return in about 6 months (around 01/21/2017) for labs.

## 2016-07-24 ENCOUNTER — Other Ambulatory Visit: Payer: Self-pay

## 2016-07-24 MED ORDER — TRAMADOL HCL 50 MG PO TABS: 50.0000 mg | ORAL_TABLET | Freq: Three times a day (TID) | ORAL | 5 refills | Status: DC | PRN

## 2016-07-29 ENCOUNTER — Other Ambulatory Visit: Payer: Self-pay | Admitting: Unknown Physician Specialty

## 2016-07-29 DIAGNOSIS — M549 Dorsalgia, unspecified: Secondary | ICD-10-CM

## 2016-07-29 DIAGNOSIS — G8929 Other chronic pain: Secondary | ICD-10-CM

## 2016-07-29 DIAGNOSIS — M069 Rheumatoid arthritis, unspecified: Secondary | ICD-10-CM

## 2016-07-29 NOTE — Progress Notes (Signed)
Called and let patient know what Malachy Mood said about pain management referral. I explained to patient that I believe that our referral coordinator usually sends patient's referrals to [facility] pain management. I explained that once she sends the referral, then someone from that office should call with an appointment. I asked for patient to give [place] a call with any questions or concerns.

## 2016-07-29 NOTE — Progress Notes (Signed)
Please let pt know that rheumatology is asking me to refer him to the pain clinic for further management and I am entering a referral

## 2016-08-02 ENCOUNTER — Other Ambulatory Visit: Payer: Self-pay

## 2016-08-02 ENCOUNTER — Other Ambulatory Visit: Payer: Self-pay | Admitting: Unknown Physician Specialty

## 2016-08-02 MED ORDER — HYDROCHLOROTHIAZIDE 25 MG PO TABS: 25.0000 mg | ORAL_TABLET | Freq: Every day | ORAL | 1 refills | Status: DC

## 2016-08-02 NOTE — Telephone Encounter (Signed)
Routing to provider  

## 2016-08-13 ENCOUNTER — Telehealth: Payer: Self-pay | Admitting: *Deleted

## 2016-08-15 DIAGNOSIS — M069 Rheumatoid arthritis, unspecified: Secondary | ICD-10-CM | POA: Diagnosis not present

## 2016-08-15 DIAGNOSIS — L97511 Non-pressure chronic ulcer of other part of right foot limited to breakdown of skin: Secondary | ICD-10-CM | POA: Diagnosis not present

## 2016-08-15 DIAGNOSIS — M79671 Pain in right foot: Secondary | ICD-10-CM | POA: Diagnosis not present

## 2016-08-15 DIAGNOSIS — S93104A Unspecified dislocation of right toe(s), initial encounter: Secondary | ICD-10-CM | POA: Diagnosis not present

## 2016-08-20 ENCOUNTER — Other Ambulatory Visit: Payer: Self-pay

## 2016-08-20 MED ORDER — AMLODIPINE BESYLATE 5 MG PO TABS: 5.0000 mg | ORAL_TABLET | Freq: Every day | ORAL | 3 refills | Status: DC

## 2016-08-29 DIAGNOSIS — J301 Allergic rhinitis due to pollen: Secondary | ICD-10-CM | POA: Diagnosis not present

## 2016-08-29 DIAGNOSIS — J358 Other chronic diseases of tonsils and adenoids: Secondary | ICD-10-CM | POA: Diagnosis not present

## 2016-08-29 DIAGNOSIS — H6123 Impacted cerumen, bilateral: Secondary | ICD-10-CM | POA: Diagnosis not present

## 2016-09-26 ENCOUNTER — Ambulatory Visit: Payer: Medicare Other | Admitting: Pain Medicine

## 2016-09-30 ENCOUNTER — Ambulatory Visit (INDEPENDENT_AMBULATORY_CARE_PROVIDER_SITE_OTHER): Payer: Medicare Other | Admitting: Unknown Physician Specialty

## 2016-09-30 ENCOUNTER — Encounter: Payer: Self-pay | Admitting: Unknown Physician Specialty

## 2016-09-30 VITALS — BP 139/86 | HR 93 | Temp 98.8°F | Wt 164.2 lb

## 2016-09-30 DIAGNOSIS — M069 Rheumatoid arthritis, unspecified: Secondary | ICD-10-CM

## 2016-09-30 DIAGNOSIS — E559 Vitamin D deficiency, unspecified: Secondary | ICD-10-CM

## 2016-09-30 DIAGNOSIS — G4762 Sleep related leg cramps: Secondary | ICD-10-CM | POA: Insufficient documentation

## 2016-09-30 NOTE — Assessment & Plan Note (Addendum)
I offered referral to a pain clinic as we no longer prescribe chronic opioid therapy.  Pt became upset and said "you just don't want to."  Referred to a pain clinic

## 2016-09-30 NOTE — Assessment & Plan Note (Signed)
Pt will start Magnesium supplements at night

## 2016-09-30 NOTE — Patient Instructions (Signed)
Magnesium Citrate before bed

## 2016-09-30 NOTE — Progress Notes (Signed)
BP 139/86 (BP Location: Left Arm, Cuff Size: Normal)   Pulse 93   Temp 98.8 F (37.1 C)   Wt 164 lb 3.2 oz (74.5 kg)   SpO2 96%   BMI 25.26 kg/m    Subjective:    Patient ID: Travis Haynes, male    DOB: [birth year], 71 y.o.   MRN: [id number]  HPI: Travis Haynes is a 71 y.o. male  Chief Complaint  Patient presents with  . Leg Cramps    pt states he has been having muscle cramps in his left leg that have been bothering him   . Labs Only    pt states he needs to have his vitamin D checked per [facility] Rheumatology    Pt taking 50K Vitamin D weekly and due to have his levels checked.    Night cramps - Patient with night cramps at night and seems to be getting worse.  He states we tried giving him Gabapentin at one time but would not take it due to "side-effects"    Pain  Pt states he has pain all time.  Complains of wrists shoulder, and elbows.  Takes Tramadol TID.  Has a lot of trouble sleeping.  He would like stronger pain medication.  States rheumatology referred him here  Relevant past medical, surgical, family and social history reviewed and updated as indicated. Interim medical history since our last visit reviewed. Allergies and medications reviewed and updated.  Review of Systems  Per HPI unless specifically indicated above     Objective:    BP 139/86 (BP Location: Left Arm, Cuff Size: Normal)   Pulse 93   Temp 98.8 F (37.1 C)   Wt 164 lb 3.2 oz (74.5 kg)   SpO2 96%   BMI 25.26 kg/m   Wt Readings from Last 3 Encounters:  09/30/16 164 lb 3.2 oz (74.5 kg)  07/22/16 166 lb 8 oz (75.5 kg)  06/25/16 164 lb 12.8 oz (74.8 kg)    Physical Exam  Constitutional: He is oriented to person, place, and time. He appears well-developed and well-nourished. No distress.  HENT:  Head: Normocephalic and atraumatic.  Eyes: Conjunctivae and lids are normal. Right eye exhibits no discharge. Left eye exhibits no discharge. No scleral icterus.  Neck: Normal range of motion. Neck  supple. No JVD present. Carotid bruit is not present.  Cardiovascular: Normal rate, regular rhythm and normal heart sounds.   Pulmonary/Chest: Effort normal and breath sounds normal. No respiratory distress.  Abdominal: Normal appearance. There is no splenomegaly or hepatomegaly.  Musculoskeletal: Normal range of motion.  Neurological: He is alert and oriented to person, place, and time.  Skin: Skin is warm, dry and intact. No rash noted. No pallor.  Psychiatric: He has a normal mood and affect. His behavior is normal. Judgment and thought content normal.    Results for orders placed or performed in visit on 06/25/16  Rapid strep screen (not at [facility])  Result Value Ref Range   Strep Gp A Ag, IA W/Reflex Negative Negative  Culture, Group A Strep  Result Value Ref Range   Strep A Culture Negative       Assessment & Plan:   Problem List Items Addressed This Visit      Unprioritized   Arthritis or polyarthritis, rheumatoid ([place])    I offered referral to a pain clinic as we no longer prescribe chronic opioid therapy.  Pt became upset and said "you just don't want to."  Referred to a pain  clinic      Nocturnal leg cramps    Pt will start Magnesium supplements at night       Other Visit Diagnoses    Vitamin D deficiency    -  Primary   Relevant Orders   VITAMIN D 25 Hydroxy (Vit-D Deficiency, Fractures)       Follow up plan: Return if symptoms worsen or fail to improve.

## 2016-10-01 LAB — VITAMIN D 25 HYDROXY (VIT D DEFICIENCY, FRACTURES): Vit D, 25-Hydroxy: 62.9 ng/mL (ref 30.0–100.0)

## 2016-10-03 ENCOUNTER — Ambulatory Visit: Payer: Medicare Other | Admitting: Pain Medicine

## 2016-12-30 DIAGNOSIS — M059 Rheumatoid arthritis with rheumatoid factor, unspecified: Secondary | ICD-10-CM | POA: Diagnosis not present

## 2016-12-30 DIAGNOSIS — I10 Essential (primary) hypertension: Secondary | ICD-10-CM | POA: Diagnosis not present

## 2016-12-30 DIAGNOSIS — M1990 Unspecified osteoarthritis, unspecified site: Secondary | ICD-10-CM | POA: Diagnosis not present

## 2016-12-30 DIAGNOSIS — M069 Rheumatoid arthritis, unspecified: Secondary | ICD-10-CM | POA: Diagnosis not present

## 2016-12-30 DIAGNOSIS — J029 Acute pharyngitis, unspecified: Secondary | ICD-10-CM | POA: Diagnosis not present

## 2016-12-30 DIAGNOSIS — Z981 Arthrodesis status: Secondary | ICD-10-CM | POA: Diagnosis not present

## 2016-12-30 DIAGNOSIS — Z79899 Other long term (current) drug therapy: Secondary | ICD-10-CM | POA: Diagnosis not present

## 2016-12-30 DIAGNOSIS — Z87891 Personal history of nicotine dependence: Secondary | ICD-10-CM | POA: Diagnosis not present

## 2016-12-30 DIAGNOSIS — Z7982 Long term (current) use of aspirin: Secondary | ICD-10-CM | POA: Diagnosis not present

## 2016-12-30 DIAGNOSIS — M810 Age-related osteoporosis without current pathological fracture: Secondary | ICD-10-CM | POA: Diagnosis not present

## 2016-12-30 DIAGNOSIS — M25519 Pain in unspecified shoulder: Secondary | ICD-10-CM | POA: Diagnosis not present

## 2016-12-30 DIAGNOSIS — M79674 Pain in right toe(s): Secondary | ICD-10-CM | POA: Diagnosis not present

## 2016-12-30 DIAGNOSIS — C8510 Unspecified B-cell lymphoma, unspecified site: Secondary | ICD-10-CM | POA: Diagnosis not present

## 2016-12-30 DIAGNOSIS — M25522 Pain in left elbow: Secondary | ICD-10-CM | POA: Diagnosis not present

## 2017-01-21 ENCOUNTER — Ambulatory Visit (INDEPENDENT_AMBULATORY_CARE_PROVIDER_SITE_OTHER): Payer: Medicare Other | Admitting: Unknown Physician Specialty

## 2017-01-21 ENCOUNTER — Encounter: Payer: Self-pay | Admitting: Unknown Physician Specialty

## 2017-01-21 VITALS — BP 144/85 | HR 96 | Temp 98.8°F | Wt 159.2 lb

## 2017-01-21 DIAGNOSIS — G894 Chronic pain syndrome: Secondary | ICD-10-CM

## 2017-01-21 DIAGNOSIS — Z5181 Encounter for therapeutic drug level monitoring: Secondary | ICD-10-CM | POA: Diagnosis not present

## 2017-01-21 DIAGNOSIS — I10 Essential (primary) hypertension: Secondary | ICD-10-CM

## 2017-01-21 DIAGNOSIS — Z23 Encounter for immunization: Secondary | ICD-10-CM

## 2017-01-21 MED ORDER — TRAMADOL HCL 50 MG PO TABS: 50.0000 mg | ORAL_TABLET | Freq: Three times a day (TID) | ORAL | 5 refills | Status: DC | PRN

## 2017-01-21 MED ORDER — HYDROCHLOROTHIAZIDE 25 MG PO TABS: 25.0000 mg | ORAL_TABLET | Freq: Every day | ORAL | 1 refills | Status: DC

## 2017-01-21 NOTE — Patient Instructions (Signed)

## 2017-01-21 NOTE — Assessment & Plan Note (Signed)
Stable, continue present medications.   

## 2017-01-21 NOTE — Progress Notes (Signed)
   BP (!) 144/85 (BP Location: Left Arm, Cuff Size: Normal)   Pulse 96   Temp 98.8 F (37.1 C)   Wt 159 lb 3.2 oz (72.2 kg)   SpO2 98%   BMI 24.49 kg/m    Subjective:    Patient ID: Travis Haynes, male    DOB: 04/16/1945, 71 y.o.   MRN: [id number]  HPI: Travis Haynes is a 71 y.o. male  Chief Complaint  Patient presents with  . Hypertension   Hypertension Using medications without difficulty Average home SBP below 120   No problems or lightheadedness No chest pain with exertion or shortness of breath No Edema  Chronic pain Due to severe RA.  Taking Methotrexate new med from Rheumatology.  Taking Tramadol TID.    Relevant past medical, surgical, family and social history reviewed and updated as indicated. Interim medical history since our last visit reviewed. Allergies and medications reviewed and updated.  Review of Systems  Per HPI unless specifically indicated above     Objective:    BP (!) 144/85 (BP Location: Left Arm, Cuff Size: Normal)   Pulse 96   Temp 98.8 F (37.1 C)   Wt 159 lb 3.2 oz (72.2 kg)   SpO2 98%   BMI 24.49 kg/m   Wt Readings from Last 3 Encounters:  01/21/17 159 lb 3.2 oz (72.2 kg)  09/30/16 164 lb 3.2 oz (74.5 kg)  07/22/16 166 lb 8 oz (75.5 kg)    Physical Exam  Constitutional: He is oriented to person, place, and time. He appears well-developed and well-nourished. No distress.  HENT:  Head: Normocephalic and atraumatic.  Eyes: Conjunctivae and lids are normal. Right eye exhibits no discharge. Left eye exhibits no discharge. No scleral icterus.  Neck: Normal range of motion. Neck supple. No JVD present. Carotid bruit is not present.  Cardiovascular: Normal rate, regular rhythm and normal heart sounds.   Pulmonary/Chest: Effort normal and breath sounds normal. No respiratory distress.  Abdominal: Normal appearance. There is no splenomegaly or hepatomegaly.  Musculoskeletal: Normal range of motion.  Neurological: He is alert and  oriented to person, place, and time.  Skin: Skin is warm, dry and intact. No rash noted. No pallor.  Psychiatric: He has a normal mood and affect. His behavior is normal. Judgment and thought content normal.    Results for orders placed or performed in visit on 09/30/16  VITAMIN D 25 Hydroxy (Vit-D Deficiency, Fractures)  Result Value Ref Range   Vit D, 25-Hydroxy 62.9 30.0 - 100.0 ng/mL      Assessment & Plan:   Problem List Items Addressed This Visit      Unprioritized   BP (high blood pressure)    Stable, continue present medications.        Relevant Medications   hydrochlorothiazide (HYDRODIURIL) 25 MG tablet   Chronic pain    Stable, continue present medications.        Relevant Medications   traMADol (ULTRAM) 50 MG tablet    Other Visit Diagnoses    Need for influenza vaccination    -  Primary   Relevant Orders   Flu vaccine HIGH DOSE PF (Completed)   Medication monitoring encounter       Relevant Orders   Comprehensive metabolic panel   CBC with Differential/Platelet       Follow up plan: Return in about 6 months (around 07/22/2017).

## 2017-02-11 ENCOUNTER — Other Ambulatory Visit: Payer: Medicare Other

## 2017-02-11 DIAGNOSIS — Z5181 Encounter for therapeutic drug level monitoring: Secondary | ICD-10-CM

## 2017-02-12 ENCOUNTER — Encounter: Payer: Self-pay | Admitting: Unknown Physician Specialty

## 2017-02-12 LAB — CBC WITH DIFFERENTIAL/PLATELET
Basophils Absolute: 0.1 x10E3/uL (ref 0.0–0.2)
Basos: 1 %
EOS (ABSOLUTE): 0.1 x10E3/uL (ref 0.0–0.4)
Eos: 2 %
Hematocrit: 42 % (ref 37.5–51.0)
Hemoglobin: 13.8 g/dL (ref 13.0–17.7)
Immature Grans (Abs): 0.1 x10E3/uL (ref 0.0–0.1)
Immature Granulocytes: 1 %
Lymphocytes Absolute: 1.5 x10E3/uL (ref 0.7–3.1)
Lymphs: 21 %
MCH: 31.7 pg (ref 26.6–33.0)
MCHC: 32.9 g/dL (ref 31.5–35.7)
MCV: 97 fL (ref 79–97)
Monocytes Absolute: 0.5 x10E3/uL (ref 0.1–0.9)
Monocytes: 6 %
Neutrophils Absolute: 5 x10E3/uL (ref 1.4–7.0)
Neutrophils: 69 %
Platelets: 291 x10E3/uL (ref 150–379)
RBC: 4.35 x10E6/uL (ref 4.14–5.80)
RDW: 15.1 % (ref 12.3–15.4)
WBC: 7.2 x10E3/uL (ref 3.4–10.8)

## 2017-02-12 LAB — COMPREHENSIVE METABOLIC PANEL
A/G RATIO: 1.8 (ref 1.2–2.2)
ALK PHOS: 55 IU/L (ref 39–117)
ALT: 26 IU/L (ref 0–44)
AST: 18 IU/L (ref 0–40)
Albumin: 4.5 g/dL (ref 3.5–4.8)
BUN/Creatinine Ratio: 10 (ref 10–24)
BUN: 9 mg/dL (ref 8–27)
Bilirubin Total: 0.4 mg/dL (ref 0.0–1.2)
CO2: 27 mmol/L (ref 20–29)
Calcium: 9.2 mg/dL (ref 8.6–10.2)
Chloride: 97 mmol/L (ref 96–106)
Creatinine, Ser: 0.91 mg/dL (ref 0.76–1.27)
GFR calc Af Amer: 98 mL/min/{1.73_m2} (ref >59)
GFR calc non Af Amer: 85 mL/min/{1.73_m2} (ref >59)
GLOBULIN, TOTAL: 2.5 g/dL (ref 1.5–4.5)
Glucose: 120 mg/dL — ABNORMAL HIGH (ref 65–99)
POTASSIUM: 3.7 mmol/L (ref 3.5–5.2)
SODIUM: 141 mmol/L (ref 134–144)
Total Protein: 7 g/dL (ref 6.0–8.5)

## 2017-02-25 DIAGNOSIS — M79671 Pain in right foot: Secondary | ICD-10-CM | POA: Diagnosis not present

## 2017-02-25 DIAGNOSIS — L97511 Non-pressure chronic ulcer of other part of right foot limited to breakdown of skin: Secondary | ICD-10-CM | POA: Diagnosis not present

## 2017-02-25 DIAGNOSIS — M069 Rheumatoid arthritis, unspecified: Secondary | ICD-10-CM | POA: Diagnosis not present

## 2017-02-25 DIAGNOSIS — S93104A Unspecified dislocation of right toe(s), initial encounter: Secondary | ICD-10-CM | POA: Diagnosis not present

## 2017-03-20 ENCOUNTER — Other Ambulatory Visit: Payer: Self-pay

## 2017-03-20 NOTE — Telephone Encounter (Signed)
Patient last seen 01/21/2017

## 2017-03-21 MED ORDER — ATORVASTATIN CALCIUM 10 MG PO TABS: 10.0000 mg | ORAL_TABLET | Freq: Every day | ORAL | 3 refills | Status: DC

## 2017-05-12 DIAGNOSIS — M79672 Pain in left foot: Secondary | ICD-10-CM | POA: Diagnosis not present

## 2017-05-12 DIAGNOSIS — Z9221 Personal history of antineoplastic chemotherapy: Secondary | ICD-10-CM | POA: Diagnosis not present

## 2017-05-12 DIAGNOSIS — M0579 Rheumatoid arthritis with rheumatoid factor of multiple sites without organ or systems involvement: Secondary | ICD-10-CM | POA: Diagnosis not present

## 2017-05-12 DIAGNOSIS — M069 Rheumatoid arthritis, unspecified: Secondary | ICD-10-CM | POA: Diagnosis not present

## 2017-05-12 DIAGNOSIS — Z981 Arthrodesis status: Secondary | ICD-10-CM | POA: Diagnosis not present

## 2017-05-12 DIAGNOSIS — Z79899 Other long term (current) drug therapy: Secondary | ICD-10-CM | POA: Diagnosis not present

## 2017-05-12 DIAGNOSIS — Z7952 Long term (current) use of systemic steroids: Secondary | ICD-10-CM | POA: Diagnosis not present

## 2017-05-12 DIAGNOSIS — Z7982 Long term (current) use of aspirin: Secondary | ICD-10-CM | POA: Diagnosis not present

## 2017-05-12 DIAGNOSIS — M25511 Pain in right shoulder: Secondary | ICD-10-CM | POA: Diagnosis not present

## 2017-05-12 DIAGNOSIS — Z87891 Personal history of nicotine dependence: Secondary | ICD-10-CM | POA: Diagnosis not present

## 2017-05-12 DIAGNOSIS — Z7951 Long term (current) use of inhaled steroids: Secondary | ICD-10-CM | POA: Diagnosis not present

## 2017-05-12 DIAGNOSIS — C8510 Unspecified B-cell lymphoma, unspecified site: Secondary | ICD-10-CM | POA: Diagnosis not present

## 2017-07-14 ENCOUNTER — Other Ambulatory Visit: Payer: Self-pay | Admitting: Unknown Physician Specialty

## 2017-07-22 ENCOUNTER — Ambulatory Visit: Payer: Medicare Other | Admitting: Unknown Physician Specialty

## 2017-07-28 ENCOUNTER — Ambulatory Visit (INDEPENDENT_AMBULATORY_CARE_PROVIDER_SITE_OTHER): Payer: Medicare Other | Admitting: Unknown Physician Specialty

## 2017-07-28 ENCOUNTER — Encounter: Payer: Self-pay | Admitting: Unknown Physician Specialty

## 2017-07-28 VITALS — BP 146/84 | HR 112 | Temp 98.7°F | Wt 149.0 lb

## 2017-07-28 DIAGNOSIS — L509 Urticaria, unspecified: Secondary | ICD-10-CM

## 2017-07-28 DIAGNOSIS — T148XXA Other injury of unspecified body region, initial encounter: Secondary | ICD-10-CM | POA: Diagnosis not present

## 2017-07-28 NOTE — Progress Notes (Signed)
BP (!) 146/84   Pulse (!) 112   Temp 98.7 F (37.1 C) (Oral)   Wt 149 lb (67.6 kg)   SpO2 98%   BMI 22.92 kg/m    Subjective:    Patient ID: Travis Haynes, male    DOB: 1945-05-28, 72 y.o.   MRN: [id number]  HPI: Travis Haynes is a 72 y.o. male  Chief Complaint  Patient presents with  . Bleeding/Bruising    pt states he has been bruising more easily now  . Pruritis    pt states he gets these itching spells in various places, states this has been happening for years. Also had welps on the left side of his back as well, has a picture of them.    Pt states he has "spells" a couple of times a year.  States it is a sudden onset of itching starts in hands, goes up in arms.  This past time in January, this happened and went to his chest and back.  He ended up with bruises on his left side.   The only change is Methotrexate.  This reaction does not seem to be associated with food and can eat meat without this happening.  No SOB  Bruising Pt states he is bruising more on his arms. Reviewed labs on 2/18.  Platelet count was 271.  AST/ALT normal  Relevant past medical, surgical, family and social history reviewed and updated as indicated. Interim medical history since our last visit reviewed. Allergies and medications reviewed and updated.  Review of Systems  Per HPI unless specifically indicated above     Objective:    BP (!) 146/84   Pulse (!) 112   Temp 98.7 F (37.1 C) (Oral)   Wt 149 lb (67.6 kg)   SpO2 98%   BMI 22.92 kg/m   Wt Readings from Last 3 Encounters:  07/28/17 149 lb (67.6 kg)  01/21/17 159 lb 3.2 oz (72.2 kg)  09/30/16 164 lb 3.2 oz (74.5 kg)    Physical Exam  Constitutional: He is oriented to person, place, and time. He appears well-developed and well-nourished. No distress.  HENT:  Head: Normocephalic and atraumatic.  Eyes: Conjunctivae and lids are normal. Right eye exhibits no discharge. Left eye exhibits no discharge. No scleral icterus.  Neck:  Normal range of motion. Neck supple. No JVD present. Carotid bruit is not present.  Cardiovascular: Normal rate, regular rhythm and normal heart sounds.  Pulmonary/Chest: Effort normal and breath sounds normal. No respiratory distress.  Abdominal: Normal appearance. There is no splenomegaly or hepatomegaly.  Musculoskeletal: Normal range of motion.  Neurological: He is alert and oriented to person, place, and time.  Skin: Skin is warm, dry and intact.  Scattered bruising bilateral arms  Psychiatric: He has a normal mood and affect. His behavior is normal. Judgment and thought content normal.     Assessment & Plan:   Problem List Items Addressed This Visit      Unprioritized   Urticaria    Pt with intermittent episodes of Urticaria.  Will check alpha-gal.  Discussed having Benadryl around to take prn next episode      Relevant Orders   Alpha Gal IgE    Other Visit Diagnoses    Bruising    -  Primary   New problems despite no changes besides increasing MTX.  Will check labs for further evaluation   Relevant Orders   CBC with Differential/Platelet   INR/PT   Comprehensive metabolic panel  Follow up plan: Return if symptoms worsen or fail to improve.

## 2017-07-28 NOTE — Addendum Note (Signed)
Addended by: Kathrine Haddock on: 07/28/2017 04:33 PM   Modules accepted: Orders

## 2017-07-28 NOTE — Assessment & Plan Note (Signed)
Pt with intermittent episodes of Urticaria.  Will check alpha-gal.  Discussed having Benadryl around to take prn next episode

## 2017-07-29 LAB — COAGUCHEK XS/INR WAIVED
INR: 1 (ref 0.9–1.1)
PROTHROMBIN TIME: 11.5 s

## 2017-07-30 ENCOUNTER — Other Ambulatory Visit: Payer: Self-pay | Admitting: Unknown Physician Specialty

## 2017-07-30 ENCOUNTER — Ambulatory Visit (INDEPENDENT_AMBULATORY_CARE_PROVIDER_SITE_OTHER): Payer: Medicare Other | Admitting: Unknown Physician Specialty

## 2017-07-30 ENCOUNTER — Encounter: Payer: Medicare Other | Admitting: Unknown Physician Specialty

## 2017-07-30 ENCOUNTER — Encounter: Payer: Self-pay | Admitting: Unknown Physician Specialty

## 2017-07-30 DIAGNOSIS — Z7189 Other specified counseling: Secondary | ICD-10-CM | POA: Diagnosis not present

## 2017-07-30 DIAGNOSIS — I10 Essential (primary) hypertension: Secondary | ICD-10-CM | POA: Diagnosis not present

## 2017-07-30 DIAGNOSIS — M549 Dorsalgia, unspecified: Secondary | ICD-10-CM | POA: Diagnosis not present

## 2017-07-30 DIAGNOSIS — D7589 Other specified diseases of blood and blood-forming organs: Secondary | ICD-10-CM | POA: Insufficient documentation

## 2017-07-30 DIAGNOSIS — G8929 Other chronic pain: Secondary | ICD-10-CM

## 2017-07-30 DIAGNOSIS — D539 Nutritional anemia, unspecified: Secondary | ICD-10-CM | POA: Diagnosis not present

## 2017-07-30 DIAGNOSIS — M069 Rheumatoid arthritis, unspecified: Secondary | ICD-10-CM

## 2017-07-30 NOTE — Assessment & Plan Note (Signed)
Stable, continue present medications.   

## 2017-07-30 NOTE — Assessment & Plan Note (Signed)
New diagnosis.  Check B12 and Folate

## 2017-07-30 NOTE — Assessment & Plan Note (Signed)
Discussed back brace.  Not indicated but he may find it helpful

## 2017-07-30 NOTE — Assessment & Plan Note (Signed)
Refuses to discuss today

## 2017-07-30 NOTE — Progress Notes (Signed)
BP 122/76   Pulse (!) 105   Temp 98.1 F (36.7 C) (Oral)   Ht 5\' 7"  (1.702 m)   Wt 149 lb (67.6 kg)   SpO2 98%   BMI 23.34 kg/m    Subjective:    Patient ID: Travis Haynes, male    DOB: 1945-10-29, 72 y.o.   MRN: [id number]  HPI: Travis Haynes is a 72 y.o. male  Chief Complaint  Patient presents with  . Annual Exam   Pt is here for general history and physical.  However, he refuses to answer the Medicare wellness questions.  Labs done this week due to urticaria and bruising.  They were normal except new Macrocytosis was noted  Hypertension Using medications without difficulty Average home BPs Not checking   No problems or lightheadedness No chest pain with exertion or shortness of breath No Edema  Hyperlipidemia Using medications without problems No Muscle aches  Diet compliance: Exercise:  Relevant past medical, surgical, family and social history reviewed and updated as indicated. Interim medical history since our last visit reviewed. Allergies and medications reviewed and updated.  Review of Systems  Musculoskeletal:       Concerned about back strength.  He was wondering about a back brace that pumps up.    Skin:       Concerned about skin changes.  Red splotches on arms    Per HPI unless specifically indicated above     Objective:    BP 122/76   Pulse (!) 105   Temp 98.1 F (36.7 C) (Oral)   Ht 5\' 7"  (1.702 m)   Wt 149 lb (67.6 kg)   SpO2 98%   BMI 23.34 kg/m   Wt Readings from Last 3 Encounters:  07/30/17 149 lb (67.6 kg)  07/28/17 149 lb (67.6 kg)  01/21/17 159 lb 3.2 oz (72.2 kg)    Physical Exam  Constitutional: He is oriented to person, place, and time. He appears well-developed and well-nourished. No distress.  HENT:  Head: Normocephalic and atraumatic.  Eyes: Conjunctivae and lids are normal. Right eye exhibits no discharge. Left eye exhibits no discharge. No scleral icterus.  Neck: Normal range of motion. Neck supple. No JVD  present. Carotid bruit is not present.  Cardiovascular: Normal rate, regular rhythm and normal heart sounds.  Pulmonary/Chest: Effort normal and breath sounds normal. No respiratory distress.  Abdominal: Normal appearance. There is no splenomegaly or hepatomegaly.  Musculoskeletal: Normal range of motion.  Neurological: He is alert and oriented to person, place, and time.  Skin: Skin is warm, dry and intact. No rash noted. No pallor.  Psychiatric: He has a normal mood and affect. His behavior is normal. Judgment and thought content normal.      Assessment & Plan:   Problem List Items Addressed This Visit      Unprioritized   Advanced care planning/counseling discussion    Refuses to discuss today      BP (high blood pressure)    Stable, continue present medications.        Chronic back pain    Discussed back brace.  Not indicated but he may find it helpful      Relevant Medications   naproxen sodium (ALEVE) 220 MG tablet   Macrocytosis    New diagnosis.  Check B12 and Folate      Relevant Orders   Folate   Rheumatoid arthritis ([place])    Per [facility]      Relevant Medications  naproxen sodium (ALEVE) 220 MG tablet       Follow up plan: Return in about 6 months (around 01/30/2018).

## 2017-07-30 NOTE — Patient Instructions (Signed)
Back brace: [email or web address]

## 2017-07-30 NOTE — Assessment & Plan Note (Signed)
Per [facility]

## 2017-07-31 LAB — COMPREHENSIVE METABOLIC PANEL
A/G RATIO: 2.4 — AB (ref 1.2–2.2)
ALBUMIN: 4.5 g/dL (ref 3.5–4.8)
ALT: 57 IU/L — AB (ref 0–44)
AST: 33 IU/L (ref 0–40)
Alkaline Phosphatase: 52 IU/L (ref 39–117)
BILIRUBIN TOTAL: 0.8 mg/dL (ref 0.0–1.2)
BUN/Creatinine Ratio: 16 (ref 10–24)
BUN: 14 mg/dL (ref 8–27)
CALCIUM: 9.2 mg/dL (ref 8.6–10.2)
CO2: 25 mmol/L (ref 20–29)
Chloride: 97 mmol/L (ref 96–106)
Creatinine, Ser: 0.88 mg/dL (ref 0.76–1.27)
GFR calc non Af Amer: 87 mL/min/{1.73_m2} (ref >59)
GFR, EST AFRICAN AMERICAN: 101 mL/min/{1.73_m2} (ref >59)
Globulin, Total: 1.9 g/dL (ref 1.5–4.5)
Glucose: 99 mg/dL (ref 65–99)
Potassium: 3.7 mmol/L (ref 3.5–5.2)
Sodium: 139 mmol/L (ref 134–144)
Total Protein: 6.4 g/dL (ref 6.0–8.5)

## 2017-07-31 LAB — CBC WITH DIFFERENTIAL/PLATELET
BASOS: 0 %
Basophils Absolute: 0 10*3/uL (ref 0.0–0.2)
EOS (ABSOLUTE): 0.2 10*3/uL (ref 0.0–0.4)
EOS: 3 %
HEMOGLOBIN: 12.8 g/dL — AB (ref 13.0–17.7)
Hematocrit: 39.1 % (ref 37.5–51.0)
Immature Grans (Abs): 0 10*3/uL (ref 0.0–0.1)
Immature Granulocytes: 0 %
LYMPHS: 12 %
Lymphocytes Absolute: 1.1 10*3/uL (ref 0.7–3.1)
MCH: 34.3 pg — AB (ref 26.6–33.0)
MCHC: 32.7 g/dL (ref 31.5–35.7)
MCV: 105 fL — AB (ref 79–97)
MONOCYTES: 6 %
Monocytes Absolute: 0.6 10*3/uL (ref 0.1–0.9)
NEUTROS ABS: 7.4 10*3/uL — AB (ref 1.4–7.0)
Neutrophils: 79 %
Platelets: 296 10*3/uL (ref 150–379)
RBC: 3.73 x10E6/uL — ABNORMAL LOW (ref 4.14–5.80)
RDW: 15.9 % — ABNORMAL HIGH (ref 12.3–15.4)
WBC: 9.4 10*3/uL (ref 3.4–10.8)

## 2017-07-31 LAB — ALPHA GAL IGE: Alpha Gal IgE*: 1.61 kU/L — ABNORMAL HIGH (ref <0.10)

## 2017-07-31 LAB — FOLATE

## 2017-07-31 LAB — VITAMIN B12: Vitamin B-12: 390 pg/mL (ref 232–1245)

## 2017-08-06 DIAGNOSIS — Z7952 Long term (current) use of systemic steroids: Secondary | ICD-10-CM | POA: Diagnosis not present

## 2017-08-06 DIAGNOSIS — I10 Essential (primary) hypertension: Secondary | ICD-10-CM | POA: Diagnosis not present

## 2017-08-06 DIAGNOSIS — M810 Age-related osteoporosis without current pathological fracture: Secondary | ICD-10-CM | POA: Diagnosis not present

## 2017-08-06 DIAGNOSIS — M899 Disorder of bone, unspecified: Secondary | ICD-10-CM | POA: Diagnosis not present

## 2017-08-06 DIAGNOSIS — Z7982 Long term (current) use of aspirin: Secondary | ICD-10-CM | POA: Diagnosis not present

## 2017-08-06 DIAGNOSIS — Z981 Arthrodesis status: Secondary | ICD-10-CM | POA: Diagnosis not present

## 2017-08-06 DIAGNOSIS — Z87891 Personal history of nicotine dependence: Secondary | ICD-10-CM | POA: Diagnosis not present

## 2017-08-06 DIAGNOSIS — Z9229 Personal history of other drug therapy: Secondary | ICD-10-CM | POA: Diagnosis not present

## 2017-08-06 DIAGNOSIS — Z79899 Other long term (current) drug therapy: Secondary | ICD-10-CM | POA: Diagnosis not present

## 2017-08-06 DIAGNOSIS — Z8571 Personal history of Hodgkin lymphoma: Secondary | ICD-10-CM | POA: Diagnosis not present

## 2017-08-06 DIAGNOSIS — Z888 Allergy status to other drugs, medicaments and biological substances status: Secondary | ICD-10-CM | POA: Diagnosis not present

## 2017-08-06 DIAGNOSIS — M0579 Rheumatoid arthritis with rheumatoid factor of multiple sites without organ or systems involvement: Secondary | ICD-10-CM | POA: Diagnosis not present

## 2017-08-06 DIAGNOSIS — Z9221 Personal history of antineoplastic chemotherapy: Secondary | ICD-10-CM | POA: Diagnosis not present

## 2017-08-06 DIAGNOSIS — Z7951 Long term (current) use of inhaled steroids: Secondary | ICD-10-CM | POA: Diagnosis not present

## 2017-08-06 DIAGNOSIS — E559 Vitamin D deficiency, unspecified: Secondary | ICD-10-CM | POA: Diagnosis not present

## 2017-08-06 DIAGNOSIS — M069 Rheumatoid arthritis, unspecified: Secondary | ICD-10-CM | POA: Diagnosis not present

## 2017-08-07 ENCOUNTER — Other Ambulatory Visit: Payer: Self-pay | Admitting: Unknown Physician Specialty

## 2017-08-07 DIAGNOSIS — M069 Rheumatoid arthritis, unspecified: Secondary | ICD-10-CM

## 2017-08-08 NOTE — Telephone Encounter (Signed)
I will give one refill, but please let him know that because of current regulations, I will need to refer to the pain clinic.  I can give him 1 months only.  Referral placed.

## 2017-08-08 NOTE — Telephone Encounter (Signed)
LOV 07/30/17 Travis Haynes Last refill  01/01/17 #90  With 5 refills

## 2017-08-11 ENCOUNTER — Other Ambulatory Visit: Payer: Self-pay | Admitting: Family Medicine

## 2017-08-11 NOTE — Telephone Encounter (Signed)
Letter sent advising patient of what Malachy Mood said regarding medication and referral.

## 2017-08-12 NOTE — Telephone Encounter (Signed)
amlodipine refill Last OV: 07/30/17 Last Refill:08/20/16 #90 3 RF Pharmacy:[facility] [address] PCP: Kathrine Haddock NP

## 2017-08-26 ENCOUNTER — Telehealth: Payer: Self-pay | Admitting: Unknown Physician Specialty

## 2017-08-26 MED ORDER — OMEPRAZOLE 20 MG PO CPDR: 40.0000 mg | DELAYED_RELEASE_CAPSULE | Freq: Every day | ORAL | 3 refills | Status: DC

## 2017-08-26 NOTE — Telephone Encounter (Signed)
Copied from [place]. Topic: Quick Communication - Rx Refill/Question >> Aug 26, 2017  3:07 PM Cecelia Byars, NT wrote: Medication:  omeprazole (PRILOSEC) 20 MG capsule   Has the patient contacted their pharmacy? {yes  (Agent: If no, request that the patient contact the pharmacy for the refill.) (Agent: If yes, when and what did the pharmacy advise?)  Preferred Pharmacy (with phone number or street name):[id number] [place] [place] - [address] [phone] (Phone) [phone] (Fax)      Agent: Please be advised that RX refills may take up to 3 business days. We ask that you follow-up with your pharmacy.

## 2017-10-08 ENCOUNTER — Other Ambulatory Visit: Payer: Self-pay

## 2017-10-08 ENCOUNTER — Encounter: Payer: Self-pay | Admitting: Unknown Physician Specialty

## 2017-10-08 ENCOUNTER — Ambulatory Visit (INDEPENDENT_AMBULATORY_CARE_PROVIDER_SITE_OTHER): Payer: Medicare Other | Admitting: Unknown Physician Specialty

## 2017-10-08 DIAGNOSIS — D7589 Other specified diseases of blood and blood-forming organs: Secondary | ICD-10-CM | POA: Diagnosis not present

## 2017-10-08 DIAGNOSIS — M7541 Impingement syndrome of right shoulder: Secondary | ICD-10-CM | POA: Diagnosis not present

## 2017-10-08 DIAGNOSIS — Z91018 Allergy to other foods: Secondary | ICD-10-CM

## 2017-10-08 MED ORDER — TRIAMCINOLONE ACETONIDE 40 MG/ML IJ SUSP: 40.0000 mg | Freq: Once | INTRAMUSCULAR | Status: DC

## 2017-10-08 NOTE — Assessment & Plan Note (Signed)
Discussed allergy to alpha gal.  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 Risks discussed.

## 2017-10-08 NOTE — Progress Notes (Signed)
BP 136/81   Pulse 98   Temp 98.5 F (36.9 C) (Oral)   Ht 5\' 7"  (1.702 m)   Wt 148 lb 6.4 oz (67.3 kg)   SpO2 96%   BMI 23.24 kg/m    Subjective:    Patient ID: Travis Haynes, male    DOB: [birth year], 72 y.o.   MRN: [id number]  HPI: Travis Haynes is a 72 y.o. male  Chief Complaint  Patient presents with  . Shoulder Pain    right side since last wednesday per patient   Pt states he started having right shoulder pain.  States it locks.  Started hurting about 1 week ago.  No particular injury other than increased activity.  States the arm is in general weak and often has to hold it up.  Left shoulder bothers him as well but not as much.    Macrocytoisis Following at [facility].    Alpha gal Pt with alpha gal.    History significant for severe RA treated through rheumatology.  Already takes NSAIDs with the occasional Tramadol  Relevant past medical, surgical, family and social history reviewed and updated as indicated. Interim medical history since our last visit reviewed. Allergies and medications reviewed and updated.  Review of Systems  Constitutional: Negative.   Respiratory: Negative.   Cardiovascular: Negative.   Psychiatric/Behavioral: Negative.     Per HPI unless specifically indicated above     Objective:    BP 136/81   Pulse 98   Temp 98.5 F (36.9 C) (Oral)   Ht 5\' 7"  (1.702 m)   Wt 148 lb 6.4 oz (67.3 kg)   SpO2 96%   BMI 23.24 kg/m   Wt Readings from Last 3 Encounters:  10/08/17 148 lb 6.4 oz (67.3 kg)  07/30/17 149 lb (67.6 kg)  07/28/17 149 lb (67.6 kg)    Physical Exam  Constitutional: He is oriented to person, place, and time. He appears well-developed and well-nourished. No distress.  HENT:  Head: Normocephalic and atraumatic.  Eyes: Conjunctivae and lids are normal. Right eye exhibits no discharge. Left eye exhibits no discharge. No scleral icterus.  Neck: Normal range of motion. Neck supple. No JVD present. Carotid bruit is not  present.  Cardiovascular: Normal rate, regular rhythm and normal heart sounds.  Pulmonary/Chest: Effort normal and breath sounds normal. No respiratory distress.  Abdominal: Normal appearance. There is no splenomegaly or hepatomegaly.  Musculoskeletal:       Right shoulder: He exhibits decreased range of motion, tenderness and decreased strength. He exhibits no swelling, no crepitus, no spasm and normal pulse.  Positive impingement and empty can test  Neurological: He is alert and oriented to person, place, and time.  Skin: Skin is warm, dry and intact. No rash noted. No pallor.  Psychiatric: He has a normal mood and affect. His behavior is normal. Judgment and thought content normal.    Results for orders placed or performed in visit on 07/30/17  Vitamin B12  Result Value Ref Range   Vitamin B-12 390 232 - 1,245 pg/mL  Folate  Result Value Ref Range   Folate >20.0 >3.0 ng/mL      Assessment & Plan:   Problem List Items Addressed This Visit      Unprioritized   Allergy to alpha-gal    Discussed allergy to alpha gal.  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 Risks discussed.        Macrocytosis    Followed in [facility].  ?  A result of MTX.        Shoulder impingement syndrome, right      STEROID INJECTION  Procedure: Shoulder Intraarticular Steroid Injection Consent: DEL Risks, benefits, and alternative treatments discussed and all questions were answered.  Patient elected to proceed and verbal consent obtained.  Description: Area prepped and draped using   semi-sterile technique. Using a posterolateral approach a mixture of 4cc 0.5% marcaine & 1 cc Kenalog 40 injected in shoulder joint.  A bandage was then placed over the injection site. Complications:  none   Follow up plan: Return if symptoms worsen or fail to improve, for Will inject left shoulder if inj helped.

## 2017-10-08 NOTE — Assessment & Plan Note (Signed)
Followed in [place].  ? A result of MTX.

## 2017-10-09 ENCOUNTER — Ambulatory Visit: Payer: Medicare Other | Admitting: Family Medicine

## 2017-10-15 ENCOUNTER — Encounter: Payer: Self-pay | Admitting: Unknown Physician Specialty

## 2017-10-15 ENCOUNTER — Ambulatory Visit (INDEPENDENT_AMBULATORY_CARE_PROVIDER_SITE_OTHER): Payer: Medicare Other | Admitting: Unknown Physician Specialty

## 2017-10-15 DIAGNOSIS — M7541 Impingement syndrome of right shoulder: Secondary | ICD-10-CM

## 2017-10-15 MED ORDER — PREDNISONE 10 MG PO TABS: ORAL_TABLET | ORAL | 0 refills | Status: DC

## 2017-10-15 MED ORDER — TRAMADOL HCL 50 MG PO TABS: 50.0000 mg | ORAL_TABLET | Freq: Every day | ORAL | 0 refills | Status: AC | PRN

## 2017-10-15 NOTE — Assessment & Plan Note (Addendum)
Incomplete relief from previous steroid injection, about 50%.  Rx for prednisone taper 6-6-5-5-4-4-3-3-2-2-1-1-1/2-1/2.  Refer to Orthopedics.  Rx for #20 Tramadol for occasional use

## 2017-10-15 NOTE — Progress Notes (Signed)
   BP 109/76   Pulse (!) 101   Temp 98.8 F (37.1 C) (Oral)   Ht 5\' 7"  (1.702 m)   Wt 147 lb (66.7 kg)   SpO2 97%   BMI 23.02 kg/m    Subjective:    Patient ID: Travis Haynes, male    DOB: [birth year], 72 y.o.   MRN: [id number]  HPI: Travis Haynes is a 72 y.o. male  Chief Complaint  Patient presents with  . Shoulder Pain    R shoulder, patient states it is still hurting even after an injection   Pt with rheumatoid arthritis is here following shoulder injection last week.  States he hasn't gotten as much relief as he wanted.  States he is about 50% better.   Wonders if he can have some prednisone for his shoulder.  Currently takes 5 mg/day.    Relevant past medical, surgical, family and social history reviewed and updated as indicated. Interim medical history since our last visit reviewed. Allergies and medications reviewed and updated.  Review of Systems  Per HPI unless specifically indicated above     Objective:    BP 109/76   Pulse (!) 101   Temp 98.8 F (37.1 C) (Oral)   Ht 5\' 7"  (1.702 m)   Wt 147 lb (66.7 kg)   SpO2 97%   BMI 23.02 kg/m   Wt Readings from Last 3 Encounters:  10/15/17 147 lb (66.7 kg)  10/08/17 148 lb 6.4 oz (67.3 kg)  07/30/17 149 lb (67.6 kg)    Physical Exam  Constitutional: He is oriented to person, place, and time. He appears well-developed and well-nourished. No distress.  HENT:  Head: Normocephalic and atraumatic.  Eyes: Conjunctivae and lids are normal. Right eye exhibits no discharge. Left eye exhibits no discharge. No scleral icterus.  Cardiovascular: Normal rate.  Pulmonary/Chest: Effort normal.  Abdominal: Normal appearance. There is no splenomegaly or hepatomegaly.  Musculoskeletal: Normal range of motion.  Neurological: He is alert and oriented to person, place, and time.  Skin: Skin is intact. No rash noted. No pallor.  Psychiatric: He has a normal mood and affect. His behavior is normal. Judgment and thought content  normal.    Results for orders placed or performed in visit on 07/30/17  Vitamin B12  Result Value Ref Range   Vitamin B-12 390 232 - 1,245 pg/mL  Folate  Result Value Ref Range   Folate >20.0 >3.0 ng/mL      Assessment & Plan:   Problem List Items Addressed This Visit      Unprioritized   Shoulder impingement syndrome, right    Incomplete relief from previous steroid injection, about 50%.  Rx for prednisone taper 6-6-5-5-4-4-3-3-2-2-1-1-1/2-1/2.  Refer to Orthopedics.  Rx for #20 Tramadol for occasional use      Relevant Orders   Ambulatory referral to Orthopedic Surgery       Follow up plan: Return if symptoms worsen or fail to improve.

## 2017-10-17 DIAGNOSIS — M069 Rheumatoid arthritis, unspecified: Secondary | ICD-10-CM | POA: Diagnosis not present

## 2017-10-17 DIAGNOSIS — S93104A Unspecified dislocation of right toe(s), initial encounter: Secondary | ICD-10-CM | POA: Diagnosis not present

## 2017-10-17 DIAGNOSIS — M2041 Other hammer toe(s) (acquired), right foot: Secondary | ICD-10-CM | POA: Diagnosis not present

## 2017-10-17 DIAGNOSIS — M2042 Other hammer toe(s) (acquired), left foot: Secondary | ICD-10-CM | POA: Diagnosis not present

## 2017-10-17 DIAGNOSIS — M2011 Hallux valgus (acquired), right foot: Secondary | ICD-10-CM | POA: Diagnosis not present

## 2017-10-21 ENCOUNTER — Other Ambulatory Visit: Payer: Self-pay | Admitting: Podiatry

## 2017-11-03 ENCOUNTER — Encounter: Payer: Self-pay | Admitting: Physician Assistant

## 2017-11-03 ENCOUNTER — Ambulatory Visit (INDEPENDENT_AMBULATORY_CARE_PROVIDER_SITE_OTHER): Payer: Medicare Other | Admitting: Physician Assistant

## 2017-11-03 VITALS — BP 147/82 | HR 118 | Temp 98.8°F | Ht 67.0 in | Wt 143.0 lb

## 2017-11-03 DIAGNOSIS — M545 Low back pain, unspecified: Secondary | ICD-10-CM

## 2017-11-03 DIAGNOSIS — M069 Rheumatoid arthritis, unspecified: Secondary | ICD-10-CM | POA: Diagnosis not present

## 2017-11-03 MED ORDER — GABAPENTIN 300 MG PO CAPS: 300.0000 mg | ORAL_CAPSULE | Freq: Every day | ORAL | 0 refills | Status: DC

## 2017-11-03 NOTE — Patient Instructions (Signed)
Low Back Sprain Rehab  Ask your health care provider which exercises are safe for you. Do exercises exactly as told by your health care provider and adjust them as directed. It is normal to feel mild stretching, pulling, tightness, or discomfort as you do these exercises, but you should stop right away if you feel sudden pain or your pain gets worse. Do not begin these exercises until told by your health care provider.  Stretching and range of motion exercises  These exercises warm up your muscles and joints and improve the movement and flexibility of your back. These exercises also help to relieve pain, numbness, and tingling.  Exercise A: Lumbar rotation    1. Lie on your back on a firm surface and bend your knees.  2. Straighten your arms out to your sides so each arm forms an "L" shape with a side of your body (a 90 degree angle).  3. Slowly move both of your knees to one side of your body until you feel a stretch in your lower back. Try not to let your shoulders move off of the floor.  4. Hold for __________ seconds.  5. Tense your abdominal muscles and slowly move your knees back to the starting position.  6. Repeat this exercise on the other side of your body.  Repeat __________ times. Complete this exercise __________ times a day.  Exercise B: Prone extension on elbows    1. Lie on your abdomen on a firm surface.  2. Prop yourself up on your elbows.  3. Use your arms to help lift your chest up until you feel a gentle stretch in your abdomen and your lower back.  ? This will place some of your body weight on your elbows. If this is uncomfortable, try stacking pillows under your chest.  ? Your hips should stay down, against the surface that you are lying on. Keep your hip and back muscles relaxed.  4. Hold for __________ seconds.  5. Slowly relax your upper body and return to the starting position.  Repeat __________ times. Complete this exercise __________ times a day.  Strengthening exercises  These  exercises build strength and endurance in your back. Endurance is the ability to use your muscles for a long time, even after they get tired.  Exercise C: Pelvic tilt  1. Lie on your back on a firm surface. Bend your knees and keep your feet flat.  2. Tense your abdominal muscles. Tip your pelvis up toward the ceiling and flatten your lower back into the floor.  ? To help with this exercise, you may place a small towel under your lower back and try to push your back into the towel.  3. Hold for __________ seconds.  4. Let your muscles relax completely before you repeat this exercise.  Repeat __________ times. Complete this exercise __________ times a day.  Exercise D: Alternating arm and leg raises    1. Get on your hands and knees on a firm surface. If you are on a hard floor, you may want to use padding to cushion your knees, such as an exercise mat.  2. Line up your arms and legs. Your hands should be below your shoulders, and your knees should be below your hips.  3. Lift your left leg behind you. At the same time, raise your right arm and straighten it in front of you.  ? Do not lift your leg higher than your hip.  ? Do not lift your arm   higher than your shoulder.  ? Keep your abdominal and back muscles tight.  ? Keep your hips facing the ground.  ? Do not arch your back.  ? Keep your balance carefully, and do not hold your breath.  4. Hold for __________ seconds.  5. Slowly return to the starting position and repeat with your right leg and your left arm.  Repeat __________ times. Complete this exercise __________ times a day.  Exercise E: Abdominal set with straight leg raise    1. Lie on your back on a firm surface.  2. Bend one of your knees and keep your other leg straight.  3. Tense your abdominal muscles and lift your straight leg up, 4-6 inches (10-15 cm) off the ground.  4. Keep your abdominal muscles tight and hold for __________ seconds.  ? Do not hold your breath.  ? Do not arch your back. Keep it  flat against the ground.  5. Keep your abdominal muscles tense as you slowly lower your leg back to the starting position.  6. Repeat with your other leg.  Repeat __________ times. Complete this exercise __________ times a day.  Posture and body mechanics    Body mechanics refers to the movements and positions of your body while you do your daily activities. Posture is part of body mechanics. Good posture and healthy body mechanics can help to relieve stress in your body's tissues and joints. Good posture means that your spine is in its natural S-curve position (your spine is neutral), your shoulders are pulled back slightly, and your head is not tipped forward. The following are general guidelines for applying improved posture and body mechanics to your everyday activities.  Standing    · When standing, keep your spine neutral and your feet about hip-width apart. Keep a slight bend in your knees. Your ears, shoulders, and hips should line up.  · When you do a task in which you stand in one place for a long time, place one foot up on a stable object that is 2-4 inches (5-10 cm) high, such as a footstool. This helps keep your spine neutral.  Sitting    · When sitting, keep your spine neutral and keep your feet flat on the floor. Use a footrest, if necessary, and keep your thighs parallel to the floor. Avoid rounding your shoulders, and avoid tilting your head forward.  · When working at a desk or a computer, keep your desk at a height where your hands are slightly lower than your elbows. Slide your chair under your desk so you are close enough to maintain good posture.  · When working at a computer, place your monitor at a height where you are looking straight ahead and you do not have to tilt your head forward or downward to look at the screen.  Resting    · When lying down and resting, avoid positions that are most painful for you.  · If you have pain with activities such as sitting, bending, stooping, or squatting  (flexion-based activities), lie in a position in which your body does not bend very much. For example, avoid curling up on your side with your arms and knees near your chest (fetal position).  · If you have pain with activities such as standing for a long time or reaching with your arms (extension-based activities), lie with your spine in a neutral position and bend your knees slightly. Try the following positions:  · Lying on your side with a   pillow between your knees.  · Lying on your back with a pillow under your knees.  Lifting    · When lifting objects, keep your feet at least shoulder-width apart and tighten your abdominal muscles.  · Bend your knees and hips and keep your spine neutral. It is important to lift using the strength of your legs, not your back. Do not lock your knees straight out.  · Always ask for help to lift heavy or awkward objects.  This information is not intended to replace advice given to you by your health care provider. Make sure you discuss any questions you have with your health care provider.  Document Released: 03/11/2005 Document Revised: 11/16/2015 Document Reviewed: 12/21/2014  Elsevier Interactive Patient Education © 2018 [employer] Inc[employer]

## 2017-11-03 NOTE — Progress Notes (Addendum)
Subjective:    Patient ID: Travis Haynes, male    DOB: 1946/01/09, 72 y.o.   MRN: [id number]  Travis Haynes is a 72 y.o. male presenting on 11/03/2017 for Back Pain (pt states he has had mid to low back pain for a week, states he does not remember injuring his back ) and Labs Only (pt states he needs a CBC and a CMP drawn for [facility] )   HPI   PMH significant for rheumatoid arthritis, [facility], on daily prednisone. Was seen by previous PCP on 10/15/2017 for shoulder impingement and got 14 day taper of prednisone along with tramadol. He reports he has had one week of back pain such that he cannot stand up straight. He has a history of chronic back pain. He reports relief when he leans forward. He denies weakness or incontinence. Denies injuries. He says he thinks it's his nerves acting up.  Denies fevers, weight loss. Says back pain flared up after stopping prednisone. Also requesting labs for [facility] to include CBC and CMET. He seems to have been on tramadol chronically for the past several years.  Social History   Tobacco Use  . Smoking status: Former Research scientist (life sciences)  . Smokeless tobacco: Never Used  Substance Use Topics  . Alcohol use: No  . Drug use: No    Review of Systems Per HPI unless specifically indicated above     Objective:    BP (!) 147/82   Pulse (!) 118   Temp 98.8 F (37.1 C) (Oral)   Ht 5' 7"  (1.702 m)   Wt 143 lb (64.9 kg)   SpO2 97%   BMI 22.40 kg/m   Wt Readings from Last 3 Encounters:  11/03/17 143 lb (64.9 kg)  10/15/17 147 lb (66.7 kg)  10/08/17 148 lb 6.4 oz (67.3 kg)    Physical Exam  Constitutional: He is oriented to person, place, and time.  Musculoskeletal: Normal range of motion. He exhibits no edema, tenderness or deformity.  Neurological: He is alert and oriented to person, place, and time. No sensory deficit.  Ambulates with walker. Strength 4/5 BLE.   Skin: Skin is warm and dry.  Psychiatric: He has a normal mood and affect. His  behavior is normal.   Results for orders placed or performed in visit on 07/30/17  Vitamin B12  Result Value Ref Range   Vitamin B-12 390 232 - 1,245 pg/mL  Folate  Result Value Ref Range   Folate >20.0 >3.0 ng/mL      Assessment & Plan:   1. Lumbar pain  Expressed to patient that I believe he has spinal stenosis coupled with chronic arthritic pain and would do best long term with a physiatrist and possible injections. Physical therapy offered and declined. He does have a history of cancer and will order Xray as first step. Do not think he should get more prednisone as he is on daily dose and got a two week taper on 10/15/2017.   Of note, he has been on tramadol chronically since at least 2015 per PMP. There are multiple documented instances of him asking for increases in pain medication and these requests being denied. He has been instructed that this clinic no longer provides chronic pain management and he has been referred to pain management on 08/08/2017. He has never scheduled this appointment.   He expresses his displeasure with my care multiple times and says he wants to find somebody who actually knows what they are doing.  Counseled patient he is more than welcome to find a provider whom he believes is competent.   - gabapentin (NEURONTIN) 300 MG capsule; Take 1 capsule (300 mg total) by mouth at bedtime.  Dispense: 90 capsule; Refill: 0 - DG Lumbar Spine Complete; Future  2. Rheumatoid arthritis, involving unspecified site, unspecified rheumatoid factor presence ([place])  Will get these labs at the request of [facility].   - CBC with Differential - Comp Met (CMET)  I have spent 25 minutes caring for this patient, >50% of which was spent on counseling and coordination care.   Follow up plan: Return in about 2 months (around 01/03/2018) for low back pain .  Carles Collet, PA-C [facility] 11/03/2017, 4:58 PM

## 2017-11-04 LAB — COMPREHENSIVE METABOLIC PANEL
ALT: 21 IU/L (ref 0–44)
AST: 13 IU/L (ref 0–40)
Albumin/Globulin Ratio: 2 (ref 1.2–2.2)
Albumin: 4.3 g/dL (ref 3.5–4.8)
Alkaline Phosphatase: 61 IU/L (ref 39–117)
BUN/Creatinine Ratio: 17 (ref 10–24)
BUN: 13 mg/dL (ref 8–27)
Bilirubin Total: 0.6 mg/dL (ref 0.0–1.2)
CO2: 29 mmol/L (ref 20–29)
Calcium: 9.3 mg/dL (ref 8.6–10.2)
Chloride: 94 mmol/L — ABNORMAL LOW (ref 96–106)
Creatinine, Ser: 0.75 mg/dL — ABNORMAL LOW (ref 0.76–1.27)
GFR calc Af Amer: 107 mL/min/{1.73_m2} (ref >59)
GFR calc non Af Amer: 93 mL/min/{1.73_m2} (ref >59)
Globulin, Total: 2.2 g/dL (ref 1.5–4.5)
Glucose: 111 mg/dL — ABNORMAL HIGH (ref 65–99)
Potassium: 4.2 mmol/L (ref 3.5–5.2)
Sodium: 138 mmol/L (ref 134–144)
Total Protein: 6.5 g/dL (ref 6.0–8.5)

## 2017-11-04 LAB — CBC WITH DIFFERENTIAL/PLATELET
Basophils Absolute: 0 10*3/uL (ref 0.0–0.2)
Basos: 0 %
EOS (ABSOLUTE): 0.1 10*3/uL (ref 0.0–0.4)
Eos: 1 %
Hematocrit: 38.9 % (ref 37.5–51.0)
Hemoglobin: 13.1 g/dL (ref 13.0–17.7)
Immature Grans (Abs): 0.1 10*3/uL (ref 0.0–0.1)
Immature Granulocytes: 1 %
Lymphocytes Absolute: 0.6 10*3/uL — ABNORMAL LOW (ref 0.7–3.1)
Lymphs: 5 %
MCH: 34.5 pg — ABNORMAL HIGH (ref 26.6–33.0)
MCHC: 33.7 g/dL (ref 31.5–35.7)
MCV: 102 fL — ABNORMAL HIGH (ref 79–97)
Monocytes Absolute: 0.6 10*3/uL (ref 0.1–0.9)
Monocytes: 5 %
Neutrophils Absolute: 10.1 10*3/uL — ABNORMAL HIGH (ref 1.4–7.0)
Neutrophils: 88 %
Platelets: 233 10*3/uL (ref 150–450)
RBC: 3.8 x10E6/uL — ABNORMAL LOW (ref 4.14–5.80)
RDW: 15.7 % — ABNORMAL HIGH (ref 12.3–15.4)
WBC: 11.5 10*3/uL — ABNORMAL HIGH (ref 3.4–10.8)

## 2017-11-10 ENCOUNTER — Other Ambulatory Visit: Payer: Self-pay | Admitting: Unknown Physician Specialty

## 2017-11-10 NOTE — Telephone Encounter (Signed)
Tramadol refill Last Refill:10/15/17 #20 Last OV: 11/03/17 PCP: Regino Schultze

## 2017-11-11 ENCOUNTER — Ambulatory Visit (INDEPENDENT_AMBULATORY_CARE_PROVIDER_SITE_OTHER): Payer: Medicare Other | Admitting: Physician Assistant

## 2017-11-11 ENCOUNTER — Encounter: Payer: Self-pay | Admitting: Physician Assistant

## 2017-11-11 ENCOUNTER — Other Ambulatory Visit: Payer: Self-pay

## 2017-11-11 VITALS — BP 136/82 | HR 121 | Temp 97.8°F | Ht 67.0 in | Wt 143.4 lb

## 2017-11-11 DIAGNOSIS — R1319 Other dysphagia: Secondary | ICD-10-CM

## 2017-11-11 DIAGNOSIS — I10 Essential (primary) hypertension: Secondary | ICD-10-CM

## 2017-11-11 DIAGNOSIS — R634 Abnormal weight loss: Secondary | ICD-10-CM

## 2017-11-11 DIAGNOSIS — D72829 Elevated white blood cell count, unspecified: Secondary | ICD-10-CM

## 2017-11-11 DIAGNOSIS — R1310 Dysphagia, unspecified: Secondary | ICD-10-CM

## 2017-11-11 DIAGNOSIS — Z01818 Encounter for other preprocedural examination: Secondary | ICD-10-CM | POA: Diagnosis not present

## 2017-11-11 DIAGNOSIS — C8590 Non-Hodgkin lymphoma, unspecified, unspecified site: Secondary | ICD-10-CM

## 2017-11-11 NOTE — Progress Notes (Signed)
Subjective:    Patient ID: Travis Haynes, male    DOB: [birth year], 72 y.o.   MRN: [id number]  Travis Haynes is a 72 y.o. male presenting on 11/11/2017 for Surgical clearance   HPI   He is presenting today for surgical clearance. He is scheduled for surgery on 11/21/2017 with Dr. Cleda Mccreedy for a Bone Excision of his right great toe due to arthritic changes and fracture. He denies history of anesthetic complications. He has well controlled hypertension, no history of MI. He has a history of rheumatoid arthritis for which he is seen at [facility] rheumatology and is currently taking 2.5 mg methotrexate and prednisone 5 mg daily.   Also reports difficulty swallowing food, has history of three esophageal dilations.    Wt Readings from Last 3 Encounters:  11/11/17 143 lb 6 oz (65 kg)  11/03/17 143 lb (64.9 kg)  10/15/17 147 lb (66.7 kg)     Social History   Tobacco Use  . Smoking status: Former Research scientist (life sciences)  . Smokeless tobacco: Never Used  Substance Use Topics  . Alcohol use: No  . Drug use: No    Review of Systems Per HPI unless specifically indicated above     Objective:    BP 136/82   Pulse (!) 121   Temp 97.8 F (36.6 C) (Oral)   Ht 5\' 7"  (1.702 m)   Wt 143 lb 6 oz (65 kg)   SpO2 98%   BMI 22.46 kg/m   Wt Readings from Last 3 Encounters:  11/11/17 143 lb 6 oz (65 kg)  11/03/17 143 lb (64.9 kg)  10/15/17 147 lb (66.7 kg)    Physical Exam  Constitutional: He is oriented to person, place, and time. He appears well-developed and well-nourished.  HENT:  Head: Normocephalic and atraumatic.  Right Ear: External ear normal.  Left Ear: External ear normal.  Mouth/Throat: Oropharynx is clear and moist. No oropharyngeal exudate.  Eyes: Pupils are equal, round, and reactive to light. Right eye exhibits no discharge. Left eye exhibits no discharge.  Neck: Neck supple.  Cardiovascular: Normal rate and regular rhythm.  Pulmonary/Chest: Effort normal and breath sounds normal.  Abdominal:  Soft. Bowel sounds are normal.  Lymphadenopathy:    He has no cervical adenopathy.  Neurological: He is alert and oriented to person, place, and time.  Skin: Skin is warm and dry.  Psychiatric: He has a normal mood and affect. His behavior is normal.   Results for orders placed or performed in visit on 11/11/17  CBC with Differential  Result Value Ref Range   WBC 9.5 3.4 - 10.8 x10E3/uL   RBC 3.76 (L) 4.14 - 5.80 x10E6/uL   Hemoglobin 12.6 (L) 13.0 - 17.7 g/dL   Hematocrit 38.9 37.5 - 51.0 %   MCV 104 (H) 79 - 97 fL   MCH 33.5 (H) 26.6 - 33.0 pg   MCHC 32.4 31.5 - 35.7 g/dL   RDW 15.2 12.3 - 15.4 %   Platelets 259 150 - 450 x10E3/uL   Neutrophils 62 Not Estab. %   Lymphs 17 Not Estab. %   Monocytes 15 Not Estab. %   Eos 3 Not Estab. %   Basos 1 Not Estab. %   Neutrophils Absolute 6.0 1.4 - 7.0 x10E3/uL   Lymphocytes Absolute 1.6 0.7 - 3.1 x10E3/uL   Monocytes Absolute 1.4 (H) 0.1 - 0.9 x10E3/uL   EOS (ABSOLUTE) 0.3 0.0 - 0.4 x10E3/uL   Basophils Absolute 0.1 0.0 - 0.2 x10E3/uL  Immature Granulocytes 2 Not Estab. %   Immature Grans (Abs) 0.2 (H) 0.0 - 0.1 x10E3/uL      Assessment & Plan:  1. Pre-op evaluation  He is scheduled for surgery with Dr. Cleda Mccreedy on 11/21/2017 for bone excision. This will require anesthesia. EKG looks normal. Have spoken with Joya San MD, who is a rheumatology fellow at [facility] who recommends stress dosing with 50 mg hydrocortisone IV just prior to surgery through anesthesia.   2. Essential hypertension  Normal.   - EKG 12-Lead  3. Leukocytosis, unspecified type  Recent leukocytosis, patient has a history of Non Hodgkinls lymphoma in 1999.   - CBC with Differential  4. Non-Hodgkin's lymphoma, unspecified body region, unspecified non-Hodgkin lymphoma type ([place])  Has 30 lb weight loss over the past three years unintentionally. He has history of cancer in 1999, has not been followed since then. Will refer him to cardiology.   - Ambulatory  referral to Hematology / Oncology  5. Weight loss  - Ambulatory referral to Hematology / Oncology  6. Esophageal dysphagia  Offer referral to GI but he has one and will call them.      Follow up plan: Return if symptoms worsen or fail to improve.  Carles Collet, PA-C [facility] 11/13/2017, 12:25 PM

## 2017-11-12 LAB — CBC WITH DIFFERENTIAL/PLATELET
Basophils Absolute: 0.1 10*3/uL (ref 0.0–0.2)
Basos: 1 %
EOS (ABSOLUTE): 0.3 10*3/uL (ref 0.0–0.4)
Eos: 3 %
Hematocrit: 38.9 % (ref 37.5–51.0)
Hemoglobin: 12.6 g/dL — ABNORMAL LOW (ref 13.0–17.7)
Immature Grans (Abs): 0.2 10*3/uL — ABNORMAL HIGH (ref 0.0–0.1)
Immature Granulocytes: 2 %
Lymphocytes Absolute: 1.6 10*3/uL (ref 0.7–3.1)
Lymphs: 17 %
MCH: 33.5 pg — ABNORMAL HIGH (ref 26.6–33.0)
MCHC: 32.4 g/dL (ref 31.5–35.7)
MCV: 104 fL — ABNORMAL HIGH (ref 79–97)
Monocytes Absolute: 1.4 10*3/uL — ABNORMAL HIGH (ref 0.1–0.9)
Monocytes: 15 %
Neutrophils Absolute: 6 10*3/uL (ref 1.4–7.0)
Neutrophils: 62 %
Platelets: 259 10*3/uL (ref 150–450)
RBC: 3.76 x10E6/uL — ABNORMAL LOW (ref 4.14–5.80)
RDW: 15.2 % (ref 12.3–15.4)
WBC: 9.5 10*3/uL (ref 3.4–10.8)

## 2017-11-16 DIAGNOSIS — Z8572 Personal history of non-Hodgkin lymphomas: Secondary | ICD-10-CM | POA: Insufficient documentation

## 2017-11-16 DIAGNOSIS — D72829 Elevated white blood cell count, unspecified: Secondary | ICD-10-CM | POA: Insufficient documentation

## 2017-11-16 NOTE — Progress Notes (Deleted)
[facility]  [phone] [phone]) [phone]  ID: KELEN LAURA OB: 1946/02/10  MR#: [id number]  KYH#:[id number]  Patient Care Team: Kathrine Haddock, NP as PCP - General (Nurse Practitioner)  CHIEF COMPLAINT: Leukocytosis, weight loss, history of lymphoma  INTERVAL HISTORY: ***  REVIEW OF SYSTEMS:   ROS  As per HPI. Otherwise, a complete review of systems is negative.  PAST MEDICAL HISTORY: Past Medical History:  Diagnosis Date  . Arthritis   . Barrett's esophagus   . Chronic back pain   . Chronic pain syndrome   . Coronary artery disease   . GERD (gastroesophageal reflux disease)   . Hemorrhoids   . Hernia of abdominal cavity   . Hyperlipidemia   . Hypertension   . Lumbago     PAST SURGICAL HISTORY: Past Surgical History:  Procedure Laterality Date  . APPENDECTOMY    . BACK SURGERY    . ESOPHAGOGASTRODUODENOSCOPY (EGD) WITH PROPOFOL N/A 12/05/2014   Procedure: ESOPHAGOGASTRODUODENOSCOPY (EGD) WITH PROPOFOL;  Surgeon: Hulen Luster, MD;  Location: [facility] ENDOSCOPY;  Service: Gastroenterology;  Laterality: N/A;  . intestine repair    . NECK SURGERY    . Reduction masseter muscle/bone extraoral      FAMILY HISTORY: Family History  Problem Relation Age of Onset  . Heart disease Mother        heart attack  . Arthritis Mother   . Arthritis Father     ADVANCED DIRECTIVES (Y/N):  N  HEALTH MAINTENANCE: Social History   Tobacco Use  . Smoking status: Former Research scientist (life sciences)  . Smokeless tobacco: Never Used  Substance Use Topics  . Alcohol use: No  . Drug use: No     Colonoscopy:  PAP:  Bone density:  Lipid panel:  Allergies  Allergen Reactions  . Ativan [Lorazepam] Other (See Comments)    Pt states medication made him hyper  . Gold Au 198 [Gold] Other (See Comments)    Pt states platelet count was low    Current Outpatient Medications  Medication Sig Dispense Refill  . acetaminophen (TYLENOL) 500 MG tablet Take 1,000 mg by mouth  every 6 (six) hours as needed.    Marland Kitchen amLODipine (NORVASC) 5 MG tablet TAKE ONE (1) TABLET EACH DAY (Patient taking differently: Take 5 mg by mouth daily. ) 90 tablet 1  . aspirin EC 81 MG tablet Take 1 tablet (81 mg total) by mouth daily. 90 tablet 12  . atorvastatin (LIPITOR) 10 MG tablet Take 1 tablet (10 mg total) by mouth daily. 90 tablet 3  . carboxymethylcellulose (REFRESH PLUS) 0.5 % SOLN Place 1 drop into both eyes at bedtime.    . Cholecalciferol (VITAMIN D-3) 1000 units CAPS Take 1,000 Units by mouth daily.    . folic acid (FOLVITE) 1 MG tablet Take 3 mg by mouth daily.   3  . gabapentin (NEURONTIN) 300 MG capsule Take 1 capsule (300 mg total) by mouth at bedtime. 90 capsule 0  . hydrochlorothiazide (HYDRODIURIL) 25 MG tablet TAKE ONE TABLET BY MOUTH EVERY DAY 90 tablet 1  . methotrexate 2.5 MG tablet Take 20 mg by mouth once a week. Thursdays    . naproxen sodium (ALEVE) 220 MG tablet Take 220-440 mg by mouth See admin instructions. Take 220mg  by mouth in the morning and 440mg  in the evening    . omeprazole (PRILOSEC) 20 MG capsule Take 2 capsules (40 mg total) by mouth daily. Take 2 capsules daily 180 capsule 3  . predniSONE (DELTASONE) 5 MG  tablet TAKE ONE TABLET BY MOUTH EVERY DAY  2  . traMADol (ULTRAM) 50 MG tablet Take 1 tablet (50 mg total) by mouth daily as needed. 20 tablet 0   Current Facility-Administered Medications  Medication Dose Route Frequency Provider Last Rate Last Dose  . triamcinolone acetonide (KENALOG-40) injection 40 mg  40 mg Intramuscular Once Kathrine Haddock, NP        OBJECTIVE: There were no vitals filed for this visit.   There is no height or weight on file to calculate BMI.    ECOG FS:{CHL ONC [id number]  General: Well-developed, well-nourished, no acute distress. Eyes: Pink conjunctiva, anicteric sclera. HEENT: Normocephalic, moist mucous membranes, clear oropharnyx. Lungs: Clear to auscultation bilaterally. Heart: Regular rate and rhythm. No  rubs, murmurs, or gallops. Abdomen: Soft, nontender, nondistended. No organomegaly noted, normoactive bowel sounds. Musculoskeletal: No edema, cyanosis, or clubbing. Neuro: Alert, answering all questions appropriately. Cranial nerves grossly intact. Skin: No rashes or petechiae noted. Psych: Normal affect. Lymphatics: No cervical, calvicular, axillary or inguinal LAD.   LAB RESULTS:  Lab Results  Component Value Date   NA 138 11/03/2017   K 4.2 11/03/2017   CL 94 (L) 11/03/2017   CO2 29 11/03/2017   GLUCOSE 111 (H) 11/03/2017   BUN 13 11/03/2017   CREATININE 0.75 (L) 11/03/2017   CALCIUM 9.3 11/03/2017   PROT 6.5 11/03/2017   ALBUMIN 4.3 11/03/2017   AST 13 11/03/2017   ALT 21 11/03/2017   ALKPHOS 61 11/03/2017   BILITOT 0.6 11/03/2017   GFRNONAA 93 11/03/2017   GFRAA 107 11/03/2017    Lab Results  Component Value Date   WBC 9.5 11/11/2017   NEUTROABS 6.0 11/11/2017   HGB 12.6 (L) 11/11/2017   HCT 38.9 11/11/2017   MCV 104 (H) 11/11/2017   PLT 259 11/11/2017     STUDIES: No results found.  ASSESSMENT: Leukocytosis, weight loss, history of lymphoma  PLAN:    Patient expressed understanding and was in agreement with this plan. He also understands that He can call clinic at any time with any questions, concerns, or complaints.   Cancer Staging No matching staging information was found for the patient.  Lloyd Huger, MD   11/16/2017 11:28 AM

## 2017-11-17 ENCOUNTER — Telehealth: Payer: Self-pay | Admitting: Unknown Physician Specialty

## 2017-11-17 NOTE — Telephone Encounter (Signed)
His red blood cells are large, which I would like him to see oncology about but they should not interfere with surgery. His white blood cell count has returned to normal

## 2017-11-17 NOTE — Telephone Encounter (Signed)
Patient is calling to get lab results. Patient notified of labs results. Message on lab: CBC stable. Will clear for surgery.  Patient wants to know if there has been any improvement in the RBC size? Told patient I would send his question to his provider.

## 2017-11-17 NOTE — Telephone Encounter (Signed)
Routing to provider. Patient wants to know about RBC's.

## 2017-11-17 NOTE — Telephone Encounter (Signed)
Called and left patient a VM asking for him to please return my call. OK for PEC to speak to patient and let him know what Fabio Bering said about RBC's.

## 2017-11-18 ENCOUNTER — Inpatient Hospital Stay: Admission: RE | Admit: 2017-11-18 | Payer: Medicare Other | Source: Ambulatory Visit

## 2017-11-18 DIAGNOSIS — M069 Rheumatoid arthritis, unspecified: Secondary | ICD-10-CM | POA: Diagnosis not present

## 2017-11-18 DIAGNOSIS — L03031 Cellulitis of right toe: Secondary | ICD-10-CM | POA: Diagnosis not present

## 2017-11-18 DIAGNOSIS — L97512 Non-pressure chronic ulcer of other part of right foot with fat layer exposed: Secondary | ICD-10-CM | POA: Diagnosis not present

## 2017-11-18 DIAGNOSIS — L02611 Cutaneous abscess of right foot: Secondary | ICD-10-CM | POA: Diagnosis not present

## 2017-11-18 DIAGNOSIS — S93104A Unspecified dislocation of right toe(s), initial encounter: Secondary | ICD-10-CM | POA: Diagnosis not present

## 2017-11-18 NOTE — Telephone Encounter (Signed)
Called and notified patient about blood work per [employer].

## 2017-11-19 ENCOUNTER — Ambulatory Visit: Payer: Medicare Other | Admitting: Physician Assistant

## 2017-11-20 ENCOUNTER — Ambulatory Visit: Payer: Medicare Other | Admitting: Oncology

## 2017-11-21 ENCOUNTER — Encounter: Admission: RE | Payer: Self-pay | Source: Ambulatory Visit

## 2017-11-21 ENCOUNTER — Ambulatory Visit: Admission: RE | Admit: 2017-11-21 | Payer: Medicare Other | Source: Ambulatory Visit | Admitting: Podiatry

## 2017-11-21 SURGERY — BONE EXCISION
Anesthesia: Choice | Laterality: Right

## 2017-11-23 DIAGNOSIS — R634 Abnormal weight loss: Secondary | ICD-10-CM

## 2017-11-23 HISTORY — DX: Abnormal weight loss: R63.4

## 2017-11-28 ENCOUNTER — Other Ambulatory Visit: Payer: Self-pay | Admitting: Physical Medicine and Rehabilitation

## 2017-11-28 DIAGNOSIS — M51369 Other intervertebral disc degeneration, lumbar region without mention of lumbar back pain or lower extremity pain: Secondary | ICD-10-CM

## 2017-11-28 DIAGNOSIS — M545 Low back pain: Secondary | ICD-10-CM | POA: Diagnosis not present

## 2017-11-28 DIAGNOSIS — G8929 Other chronic pain: Secondary | ICD-10-CM | POA: Diagnosis not present

## 2017-11-28 DIAGNOSIS — M5136 Other intervertebral disc degeneration, lumbar region: Secondary | ICD-10-CM

## 2017-12-03 ENCOUNTER — Other Ambulatory Visit: Payer: Self-pay | Admitting: Podiatry

## 2017-12-03 DIAGNOSIS — L97511 Non-pressure chronic ulcer of other part of right foot limited to breakdown of skin: Secondary | ICD-10-CM | POA: Diagnosis not present

## 2017-12-03 DIAGNOSIS — S93104A Unspecified dislocation of right toe(s), initial encounter: Secondary | ICD-10-CM | POA: Diagnosis not present

## 2017-12-08 ENCOUNTER — Other Ambulatory Visit: Payer: Self-pay

## 2017-12-08 ENCOUNTER — Encounter
Admission: RE | Admit: 2017-12-08 | Discharge: 2017-12-08 | Disposition: A | Discharge status: Home / Self Care | Payer: Medicare Other | Source: Ambulatory Visit | Attending: Podiatry | Admitting: Podiatry

## 2017-12-08 DIAGNOSIS — L97519 Non-pressure chronic ulcer of other part of right foot with unspecified severity: Secondary | ICD-10-CM | POA: Diagnosis not present

## 2017-12-08 DIAGNOSIS — Z79899 Other long term (current) drug therapy: Secondary | ICD-10-CM | POA: Diagnosis not present

## 2017-12-08 DIAGNOSIS — M12871 Other specific arthropathies, not elsewhere classified, right ankle and foot: Secondary | ICD-10-CM | POA: Diagnosis not present

## 2017-12-08 DIAGNOSIS — M069 Rheumatoid arthritis, unspecified: Secondary | ICD-10-CM | POA: Diagnosis not present

## 2017-12-08 HISTORY — DX: Abnormal weight loss: R63.4

## 2017-12-08 HISTORY — DX: Malignant (primary) neoplasm, unspecified: C80.1

## 2017-12-08 MED ORDER — CEFAZOLIN SODIUM-DEXTROSE 2-4 GM/100ML-% IV SOLN: 2.0000 g | INTRAVENOUS | Status: DC

## 2017-12-08 MED ORDER — CEFAZOLIN SODIUM-DEXTROSE 2-4 GM/100ML-% IV SOLN
2.0000 g | INTRAVENOUS | Status: AC
  Administered 2017-12-09: 2 g via INTRAVENOUS

## 2017-12-08 NOTE — Pre-Procedure Instructions (Signed)
Assessment & Plan:  1. Pre-op evaluation  He is scheduled for surgery with Dr. Cleda Mccreedy on 11/21/2017 for bone excision. This will require anesthesia. EKG looks normal. Have spoken with Joya San MD, who is a rheumatology fellow at [facility] who recommends stress dosing with 50 mg hydrocortisone IV just prior to surgery through anesthesia.  (taken from clearance note on 11/11/17 from PA at [facility])

## 2017-12-08 NOTE — Patient Instructions (Signed)
Your procedure is scheduled on: Tuesday, December 09, 2017  Report to [facility]    DO NOT STOP ON THE FIRST FLOOR TO REGISTER  To find out your arrival time please call [phone] between 1PM - 3PM on Monday, December 08, 2017  Remember: Instructions that are not followed completely may result in serious medical risk,  up to and including death, or upon the discretion of your surgeon and anesthesiologist your  surgery may need to be rescheduled.     _X__ 1. Do not eat food after midnight the night before your procedure.                 No gum chewing or hard candies.                     ABSOLUTELY NOTHING SOLID IN YOUR MOUTH AFTER MIDNIGHT                   You may drink clear liquids up to 2 hours before you are scheduled to arrive for your surgery-                   DO not drink clear liquids within 2 hours of the start of your surgery.                   Clear Liquids include:  water, apple juice without pulp, clear carbohydrate                 drink such as Clearfast of Gatorade, Black Coffee or Tea (Do not add                 anything to coffee or tea).  __X__2.  On the morning of surgery brush your teeth with toothpaste and water, you                may rinse your mouth with mouthwash if you wish.  Do not swallow any toothpaste of mouthwash.     _X__ 3.  No Alcohol for 24 hours before or after surgery.   _X__ 4.  Do Not Smoke or use e-cigarettes For 24 Hours Prior to Your Surgery.                 Do not use any chewable tobacco products for at least 6 hours prior to                 surgery.  ____  5.  Bring all medications with you on the day of surgery if instructed.   ____  6.  Notify your doctor if there is any change in your medical condition      (cold, fever, infections).     Do not wear jewelry, make-up, hairpins, clips or nail polish. Do not wear lotions, powders, or perfumes. You may wear deodorant. Do not  shave 48 hours prior to surgery. Men may shave face and neck. Do not bring valuables to the hospital.    [facility] is not responsible for any belongings or valuables.  Contacts, dentures or bridgework may not be worn into surgery. Leave your suitcase in the car. After surgery it may be brought to your room. For patients admitted to the hospital, discharge time is determined by your treatment team.   Patients discharged the day of surgery will not be allowed to drive home.   Please read over the following fact sheets that you  were given:   PREPARING FOR SURGERY   ____ Take these medicines the morning of surgery with A SIP OF WATER:    1. PRILOSEC ( TAKE THE NIGHT BEFORE SURGERY AND AGAIN ON THE MORNING OF SURGERY  2. TRAMADOL  3. TYLENOL, IF NEEDED  4.   5.  6.  ____ Fleet Enema (as directed)   _X___ Use CHG Soap as directed  ____ Use inhalers on the day of surgery  __X__ Stop ASPIRIN AS OF TODAY  __X__ Stop ALEVE / NAPROSYN / IBUPROFEN / MOTRIN / ADVIL AS OF TODAY   _X___ Stop supplements until after surgery.    ____ Bring C-Pap to the hospital.   TAKE ALL AFTERNOON AND EVENING MEDICINES ONCE YOU RETURN AFTER SURGERY   TAKE THE PREDNISONE / ATORVASTATIN ONCE YOU HAVE HAD FOOD IN YOUR STOMACH.  **YOU MAY TAKE TYLENOL AT ANY TIME**

## 2017-12-08 NOTE — Anesthesia Pain Management Evaluation Note (Signed)
        Added by: [x] Cay Schillings, RN  [] Hover for details Assessment & Plan:  1. Pre-op evaluation  He is scheduled for surgery with Dr. Cleda Mccreedy on 11/21/2017 for bone excision. This will require anesthesia. EKG looks normal. Have spoken with Joya San MD, who is a rheumatology fellow at [facility] who recommends stress dosing with 50 mg hydrocortisone IV just prior to surgery through anesthesia. (taken from clearance note on 11/11/17 from PA at [facility])

## 2017-12-09 ENCOUNTER — Ambulatory Visit: Payer: Medicare Other | Admitting: Anesthesiology

## 2017-12-09 ENCOUNTER — Other Ambulatory Visit: Payer: Self-pay

## 2017-12-09 ENCOUNTER — Ambulatory Visit
Admission: RE | Admit: 2017-12-09 | Discharge: 2017-12-09 | Disposition: A | Discharge status: Home / Self Care | Payer: Medicare Other | Source: Ambulatory Visit | Attending: Podiatry | Admitting: Podiatry

## 2017-12-09 ENCOUNTER — Encounter: Payer: Self-pay | Admitting: *Deleted

## 2017-12-09 ENCOUNTER — Encounter
Admission: RE | Disposition: A | Discharge status: Home / Self Care | Payer: Self-pay | Source: Ambulatory Visit | Attending: Podiatry

## 2017-12-09 DIAGNOSIS — Z79899 Other long term (current) drug therapy: Secondary | ICD-10-CM | POA: Insufficient documentation

## 2017-12-09 DIAGNOSIS — L97519 Non-pressure chronic ulcer of other part of right foot with unspecified severity: Secondary | ICD-10-CM | POA: Diagnosis not present

## 2017-12-09 DIAGNOSIS — M069 Rheumatoid arthritis, unspecified: Secondary | ICD-10-CM | POA: Diagnosis not present

## 2017-12-09 DIAGNOSIS — K219 Gastro-esophageal reflux disease without esophagitis: Secondary | ICD-10-CM | POA: Diagnosis not present

## 2017-12-09 DIAGNOSIS — I10 Essential (primary) hypertension: Secondary | ICD-10-CM | POA: Diagnosis not present

## 2017-12-09 DIAGNOSIS — M12871 Other specific arthropathies, not elsewhere classified, right ankle and foot: Secondary | ICD-10-CM | POA: Insufficient documentation

## 2017-12-09 DIAGNOSIS — S93111A Dislocation of interphalangeal joint of right great toe, initial encounter: Secondary | ICD-10-CM | POA: Diagnosis not present

## 2017-12-09 DIAGNOSIS — S93104A Unspecified dislocation of right toe(s), initial encounter: Secondary | ICD-10-CM | POA: Diagnosis not present

## 2017-12-09 DIAGNOSIS — E785 Hyperlipidemia, unspecified: Secondary | ICD-10-CM | POA: Diagnosis not present

## 2017-12-09 DIAGNOSIS — M05771 Rheumatoid arthritis with rheumatoid factor of right ankle and foot without organ or systems involvement: Secondary | ICD-10-CM | POA: Diagnosis not present

## 2017-12-09 HISTORY — PX: EXCISION PARTIAL PHALANX: SHX6617

## 2017-12-09 LAB — GLUCOSE, CAPILLARY: Glucose-Capillary: 85 mg/dL (ref 70–99)

## 2017-12-09 SURGERY — EXCISION, PHALANX, PARTIAL
Anesthesia: General | Laterality: Right

## 2017-12-09 MED ORDER — PROPOFOL 10 MG/ML IV BOLUS
INTRAVENOUS | Status: DC | PRN
  Administered 2017-12-09: 50 mg via INTRAVENOUS
  Administered 2017-12-09: 10 mg via INTRAVENOUS

## 2017-12-09 MED ORDER — CEFAZOLIN SODIUM-DEXTROSE 2-4 GM/100ML-% IV SOLN
INTRAVENOUS | Status: AC
  Filled 2017-12-09: qty 100

## 2017-12-09 MED ORDER — AMOXICILLIN-POT CLAVULANATE 875-125 MG PO TABS: 1.0000 | ORAL_TABLET | Freq: Two times a day (BID) | ORAL | 0 refills | Status: AC

## 2017-12-09 MED ORDER — LACTATED RINGERS IV SOLN
INTRAVENOUS | Status: DC
  Administered 2017-12-09: 12:00:00 via INTRAVENOUS

## 2017-12-09 MED ORDER — HYDROCORTISONE NA SUCCINATE PF 100 MG IJ SOLR
INTRAMUSCULAR | Status: AC
  Filled 2017-12-09: qty 2

## 2017-12-09 MED ORDER — BUPIVACAINE HCL (PF) 0.5 % IJ SOLN
INTRAMUSCULAR | Status: DC | PRN
  Administered 2017-12-09: 10 mL

## 2017-12-09 MED ORDER — FENTANYL CITRATE (PF) 100 MCG/2ML IJ SOLN
INTRAMUSCULAR | Status: AC
  Filled 2017-12-09: qty 2

## 2017-12-09 MED ORDER — POVIDONE-IODINE 7.5 % EX SOLN
Freq: Once | CUTANEOUS | Status: DC
  Filled 2017-12-09: qty 118

## 2017-12-09 MED ORDER — FENTANYL CITRATE (PF) 100 MCG/2ML IJ SOLN
INTRAMUSCULAR | Status: DC | PRN
  Administered 2017-12-09 (×2): 25 ug via INTRAVENOUS

## 2017-12-09 MED ORDER — FENTANYL CITRATE (PF) 100 MCG/2ML IJ SOLN: 25.0000 ug | INTRAMUSCULAR | Status: DC | PRN

## 2017-12-09 MED ORDER — GLYCOPYRROLATE 0.2 MG/ML IJ SOLN
INTRAMUSCULAR | Status: AC
  Filled 2017-12-09: qty 1

## 2017-12-09 MED ORDER — OXYCODONE HCL 5 MG PO TABS: 5.0000 mg | ORAL_TABLET | Freq: Three times a day (TID) | ORAL | 0 refills | Status: DC | PRN

## 2017-12-09 MED ORDER — PROPOFOL 500 MG/50ML IV EMUL
INTRAVENOUS | Status: DC | PRN
  Administered 2017-12-09: 100 ug/kg/min via INTRAVENOUS

## 2017-12-09 MED ORDER — PROPOFOL 500 MG/50ML IV EMUL
INTRAVENOUS | Status: AC
  Filled 2017-12-09: qty 50

## 2017-12-09 MED ORDER — LIDOCAINE HCL (PF) 2 % IJ SOLN
INTRAMUSCULAR | Status: AC
  Filled 2017-12-09: qty 10

## 2017-12-09 MED ORDER — BUPIVACAINE HCL (PF) 0.5 % IJ SOLN
INTRAMUSCULAR | Status: AC
  Filled 2017-12-09: qty 30

## 2017-12-09 MED ORDER — HYDROCORTISONE NA SUCCINATE PF 100 MG IJ SOLR
INTRAMUSCULAR | Status: DC | PRN
  Administered 2017-12-09: 100 mg via INTRAVENOUS

## 2017-12-09 MED ORDER — OXYCODONE HCL 5 MG PO TABS: 5.0000 mg | ORAL_TABLET | Freq: Once | ORAL | Status: DC | PRN

## 2017-12-09 MED ORDER — OXYCODONE HCL 5 MG/5ML PO SOLN: 5.0000 mg | Freq: Once | ORAL | Status: DC | PRN

## 2017-12-09 SURGICAL SUPPLY — 37 items
BLADE OSC/SAGITTAL MD 5.5X18 (BLADE) IMPLANT
BLADE SURG 15 STRL LF DISP TIS (BLADE) ×2 IMPLANT
BLADE SURG 15 STRL SS (BLADE) ×4
BLADE SURG MINI STRL (BLADE) ×3 IMPLANT
BNDG ESMARK 4X12 TAN STRL LF (GAUZE/BANDAGES/DRESSINGS) ×3 IMPLANT
CANISTER SUCT 1200ML W/VALVE (MISCELLANEOUS) ×3 IMPLANT
CLOSURE WOUND 1/4X4 (GAUZE/BANDAGES/DRESSINGS) ×1
CUFF TOURN 18 STER (MISCELLANEOUS) IMPLANT
CUFF TOURN DUAL PL 12 NO SLV (MISCELLANEOUS) ×3 IMPLANT
DRAPE FLUOR MINI C-ARM 54X84 (DRAPES) ×3 IMPLANT
DURAPREP 26ML APPLICATOR (WOUND CARE) ×3 IMPLANT
ELECT REM PT RETURN 9FT ADLT (ELECTROSURGICAL) ×3
ELECTRODE REM PT RTRN 9FT ADLT (ELECTROSURGICAL) ×1 IMPLANT
GAUZE PETRO XEROFOAM 1X8 (MISCELLANEOUS) ×3 IMPLANT
GAUZE SPONGE 4X4 12PLY STRL (GAUZE/BANDAGES/DRESSINGS) ×6 IMPLANT
GAUZE STRETCH 2X75IN STRL (MISCELLANEOUS) ×3 IMPLANT
GLOVE BIO SURGEON STRL SZ7.5 (GLOVE) ×3 IMPLANT
GLOVE INDICATOR 8.0 STRL GRN (GLOVE) ×3 IMPLANT
GOWN STRL REUS W/ TWL LRG LVL3 (GOWN DISPOSABLE) ×2 IMPLANT
GOWN STRL REUS W/TWL LRG LVL3 (GOWN DISPOSABLE) ×4
LABEL OR SOLS (LABEL) ×3 IMPLANT
NEEDLE FILTER BLUNT 18X 1/2SAF (NEEDLE) ×2
NEEDLE FILTER BLUNT 18X1 1/2 (NEEDLE) ×1 IMPLANT
NEEDLE HYPO 25X1 1.5 SAFETY (NEEDLE) ×6 IMPLANT
NS IRRIG 500ML POUR BTL (IV SOLUTION) ×3 IMPLANT
PACK EXTREMITY ARMC (MISCELLANEOUS) ×3 IMPLANT
RASP SM TEAR CROSS CUT (RASP) ×3 IMPLANT
SOL PREP PVP 2OZ (MISCELLANEOUS) ×3
SOLUTION PREP PVP 2OZ (MISCELLANEOUS) ×1 IMPLANT
STOCKINETTE STRL 6IN 960660 (GAUZE/BANDAGES/DRESSINGS) ×3 IMPLANT
STRAP SAFETY 5IN WIDE (MISCELLANEOUS) ×3 IMPLANT
STRIP CLOSURE SKIN 1/4X4 (GAUZE/BANDAGES/DRESSINGS) ×2 IMPLANT
SUT ETHILON 5-0 FS-2 18 BLK (SUTURE) ×3 IMPLANT
SUT VIC AB 4-0 FS2 27 (SUTURE) IMPLANT
SYR 10ML LL (SYRINGE) ×3 IMPLANT
WIRE Z .035 C-WIRE SPADE TIP (WIRE) ×3 IMPLANT
WIRE Z .062 C-WIRE SPADE TIP (WIRE) IMPLANT

## 2017-12-09 NOTE — Interval H&P Note (Signed)
History and Physical Interval Note:  12/09/2017 12:21 PM  Travis Haynes  has presented today for surgery, with the diagnosis of Dislocated Phalanx  The various methods of treatment have been discussed with the patient and family. After consideration of risks, benefits and other options for treatment, the patient has consented to  Procedure(s): EXCISION PARTIAL PHALANX-GREAT TOE (Right) as a surgical intervention .  The patient's history has been reviewed, patient examined, no change in status, stable for surgery.  I have reviewed the patient's chart and labs.  Questions were answered to the patient's satisfaction.     Durward Fortes

## 2017-12-09 NOTE — Anesthesia Procedure Notes (Signed)
Date/Time: 12/09/2017 1:39 PM Performed by: Doreen Salvage, CRNA Pre-anesthesia Checklist: Patient identified, Emergency Drugs available, Suction available and Patient being monitored Patient Re-evaluated:Patient Re-evaluated prior to induction Oxygen Delivery Method: Simple face mask Induction Type: IV induction Dental Injury: Teeth and Oropharynx as per pre-operative assessment

## 2017-12-09 NOTE — Op Note (Signed)
Date of operation: 12/09/2017.  Surgeon: Durward Fortes D.P.M.  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  Postoperative diagnosis: Same.  Procedure: Partial excision proximal phalanx right great toe.  Anesthesia: Local MAC.  Hemostasis: Pneumatic tourniquet right ankle 250 mmHg.  Estimated blood loss: Less than 5 cc.  Pathology: Head of the proximal phalanx right great toe.  Implants: 0.062 inch K wire.  Complications: None apparent.  Operative indications: This is a 72 year old male with a chronic painful dislocated joint in his right great toe with chronic ulceration.  Patient elects for surgical intervention to try to do remove the bony prominence and prevent further ulcers.  Operative procedure: Patient was taken the operating room placed on the table in the supine position.  Following satisfactory sedation the right foot was anesthetized with 10 cc of 0.5% bupivacaine plain around the first metatarsal.  Pneumatic tourniquet was applied at the level of the right ankle and the foot was prepped and draped in the usual sterile fashion.  The foot was exsanguinated and the tourniquet inflated to 250 mmHg.     Attention was then directed to the right foot where an approximate 4 cm linear incision was made coursing proximal to distal along the medial aspect of the hallux.  The incision was deepened via sharp and blunt dissection down to the level of the joint where a linear capsulotomy was performed.  Capsular and periosteal tissues were reflected off of the head of the proximal phalanx which was significantly degenerative.  Using a pneumatic saw the head of the proximal phalanx was removed in toto.  The wound was flushed with sterile saline and a 0.062 inch K wire was then driven distally through the distal phalanx through the tip of the toe and then retrograded proximally into the base of the proximal phalanx with care taken not to enter the joint.  Intraoperative FluoroScan  views revealed good alignment of the toe with K wire placement.  The wound was flushed with copious amounts of sterile saline and closed using 4-0 Vicryl running suture for all layers from capsular and periosteal tissue through deep and superficial subcutaneous and skin closure.  Tincture benzoin and Steri-Strips applied followed by Xeroform and a sterile gauze bandage.  Tourniquet was released.  Kerlix and an Ace wrap applied to the right lower extremity.  Patient tolerated procedure and anesthesia well and was transported to the PACU with vital signs stable and in good condition

## 2017-12-09 NOTE — Anesthesia Post-op Follow-up Note (Signed)
Anesthesia QCDR form completed.        

## 2017-12-09 NOTE — Anesthesia Preprocedure Evaluation (Addendum)
Anesthesia Evaluation  Patient identified by MRN, date of birth, ID band Patient awake    Reviewed: Allergy & Precautions, H&P , NPO status , Patient's Chart, lab work & pertinent test results  History of Anesthesia Complications Negative for: history of anesthetic complications  Airway Mallampati: III  TM Distance: <3 FB Neck ROM: full    Dental  (+) Edentulous Upper, Edentulous Lower   Pulmonary neg shortness of breath, former smoker,           Cardiovascular Exercise Tolerance: Good hypertension, (-) angina+ CAD  (-) DOE      Neuro/Psych negative neurological ROS  negative psych ROS   GI/Hepatic Neg liver ROS, GERD  Medicated and Controlled,  Endo/Other  negative endocrine ROS  Renal/GU      Musculoskeletal  (+) Arthritis ,   Abdominal   Peds  Hematology negative hematology ROS (+)   Anesthesia Other Findings Past Medical History: No date: Arthritis     Comment:  rheumatoid arthritis, takes methotrexate and prednisone               for this No date: Barrett's esophagus 1999: Cancer ([facility])     Comment:  non hodgkins lymphoma No date: Chronic back pain No date: Chronic pain syndrome No date: Coronary artery disease No date: GERD (gastroesophageal reflux disease)     Comment:  barretts esophagus that requires dilation every couple               years No date: Hemorrhoids No date: Hernia of abdominal cavity No date: Hyperlipidemia No date: Hypertension No date: Lumbago 11/2017: Weight loss     Comment:  30 lb weight loss over 3 years  Past Surgical History: 1954: [place]: BACK SURGERY     Comment:  2 fusions then 1 surgery to fix the other two               (lower)..plates and screws 7/82/9562: ESOPHAGOGASTRODUODENOSCOPY (EGD) WITH PROPOFOL; N/A     Comment:  Procedure: ESOPHAGOGASTRODUODENOSCOPY (EGD) WITH               PROPOFOL;  Surgeon: Hulen Luster, MD;  Location: [facility]      ENDOSCOPY;  Service: Gastroenterology;  Laterality: N/A; 1998: intestine repair     Comment:  during biopsy his intestines were nicked requiring               repair 1996: NECK SURGERY     Comment:  fused 2 vertebrae in neck (plate plus screws) No date: Reduction masseter muscle/bone extraoral  BMI    Body Mass Index:  21.59 kg/m      Reproductive/Obstetrics negative OB ROS                            Anesthesia Physical Anesthesia Plan  ASA: III  Anesthesia Plan: General   Post-op Pain Management:    Induction: Intravenous  PONV Risk Score and Plan: Propofol infusion and TIVA  Airway Management Planned: Natural Airway and Nasal Cannula  Additional Equipment:   Intra-op Plan:   Post-operative Plan: Extubation in OR  Informed Consent: I have reviewed the patients History and Physical, chart, labs and discussed the procedure including the risks, benefits and alternatives for the proposed anesthesia with the patient or authorized representative who has indicated his/her understanding and acceptance.   Dental Advisory Given  Plan Discussed with: Anesthesiologist, CRNA and Surgeon  Anesthesia Plan Comments: (Patient consented for risks  of anesthesia including but not limited to:  - adverse reactions to medications - damage to teeth, lips or other oral mucosa - sore throat or hoarseness - Damage to heart, brain, lungs or loss of life  Patient voiced understanding.)       Anesthesia Quick Evaluation

## 2017-12-09 NOTE — Discharge Instructions (Addendum)
° °  1.  Elevate right lower extremity.  2.  Keep the bandage on the right foot clean, dry, and do not remove.  3.  Sponge bathe only right lower extremity.  4.  Wear surgical shoe on the right foot whenever walking or standing with pressure only on the heel.  No pressure on the forefoot.  5.  Take 1 pain pill, oxycodone, every 4 hours if needed for pain.   AMBULATORY SURGERY  DISCHARGE INSTRUCTIONS   1) The drugs that you were given will stay in your system until tomorrow so for the next 24 hours you should not:  A) Drive an automobile B) Make any legal decisions C) Drink any alcoholic beverage   2) You may resume regular meals tomorrow.  Today it is better to start with liquids and gradually work up to solid foods.  You may eat anything you prefer, but it is better to start with liquids, then soup and crackers, and gradually work up to solid foods.   3) Please notify your doctor immediately if you have any unusual bleeding, trouble breathing, redness and pain at the surgery site, drainage, fever, or pain not relieved by medication.    4) Additional Instructions:        Please contact your physician with any problems or Same Day Surgery at [phone], Monday through Friday 6 am to 4 pm, or [place] [facility] number at [phone].

## 2017-12-09 NOTE — Transfer of Care (Signed)
Immediate Anesthesia Transfer of Care Note  Patient: Travis Haynes  Procedure(s) Performed: Procedure(s): EXCISION PARTIAL PHALANX-GREAT TOE (Right)  Patient Location: PACU  Anesthesia Type:General  Level of Consciousness: sedated  Airway & Oxygen Therapy: Patient Spontanous Breathing and Patient connected to face mask oxygen  Post-op Assessment: Report given to RN and Post -op Vital signs reviewed and stable  Post vital signs: Reviewed and stable  Last Vitals:  Vitals:   12/09/17 1140 12/09/17 1428  BP: (!) 143/90 126/75  Pulse: (!) 102 80  Resp: 20 15  Temp: (!) 35.7 C 36.6 C  SpO2: 06% 89%    Complications: No apparent anesthesia complications

## 2017-12-09 NOTE — H&P (Signed)
Medical history and physical in the chart is reviewed.  Patient stable for surgery.

## 2017-12-10 ENCOUNTER — Encounter: Payer: Self-pay | Admitting: Podiatry

## 2017-12-11 ENCOUNTER — Other Ambulatory Visit: Payer: Self-pay | Admitting: Unknown Physician Specialty

## 2017-12-11 NOTE — Telephone Encounter (Signed)
Needs to be given by his Rheumatologist

## 2017-12-11 NOTE — Telephone Encounter (Signed)
methotrexate refill Last Refill:11/14/17 # 32 Last OV: 11/11/17 JRP:[id number] [place] Pharmacy:[facility] [address], [place]

## 2017-12-11 NOTE — Telephone Encounter (Signed)
Attempted to contact pt regarding prescription refill request for methotrexate; per Merrie Roof, this needs to be refilled by the pt's neurologist; left message on voicemail [phone]; when pt calls back please give him this information.

## 2017-12-12 NOTE — Anesthesia Postprocedure Evaluation (Signed)
Anesthesia Post Note  Patient: DAEGEN BERROCAL  Procedure(s) Performed: EXCISION PARTIAL PHALANX-GREAT TOE (Right )  Patient location during evaluation: PACU Anesthesia Type: General Level of consciousness: awake and alert Pain management: pain level controlled Vital Signs Assessment: post-procedure vital signs reviewed and stable Respiratory status: spontaneous breathing, nonlabored ventilation, respiratory function stable and patient connected to nasal cannula oxygen Cardiovascular status: blood pressure returned to baseline and stable Postop Assessment: no apparent nausea or vomiting Anesthetic complications: no     Last Vitals:  Vitals:   12/09/17 1458 12/09/17 1514  BP: 136/65 140/72  Pulse: 75   Resp: 13 16  Temp: 36.5 C 36.6 C  SpO2: 95% 96%    Last Pain:  Vitals:   12/09/17 1514  TempSrc: Oral  PainSc: 0-No pain                 Martha Clan

## 2017-12-13 LAB — SURGICAL PATHOLOGY

## 2017-12-15 DIAGNOSIS — S93104A Unspecified dislocation of right toe(s), initial encounter: Secondary | ICD-10-CM | POA: Diagnosis not present

## 2017-12-16 ENCOUNTER — Ambulatory Visit: Payer: Medicare Other

## 2017-12-22 ENCOUNTER — Other Ambulatory Visit: Payer: Self-pay

## 2017-12-22 NOTE — Telephone Encounter (Signed)
We don't write this- will need to get it from his rheumatologist.

## 2018-01-06 ENCOUNTER — Other Ambulatory Visit: Payer: Self-pay | Admitting: Unknown Physician Specialty

## 2018-01-06 NOTE — Telephone Encounter (Signed)
Requested Prescriptions  Pending Prescriptions Disp Refills  . hydrochlorothiazide (HYDRODIURIL) 25 MG tablet [Pharmacy Med Name: HYDROCHLOROTHIAZIDE 25 MG TAB] 90 tablet 1    Sig: TAKE 1 TABLET BY MOUTH EVERY DAY     Cardiovascular: Diuretics - Thiazide Failed - 01/06/2018  1:50 AM      Failed - Cr in normal range and within 360 days    Creatinine, Ser  Date Value Ref Range Status  11/03/2017 0.75 (L) 0.76 - 1.27 mg/dL Final         Failed - Last BP in normal range    BP Readings from Last 1 Encounters:  12/09/17 140/72         Passed - Ca in normal range and within 360 days    Calcium  Date Value Ref Range Status  11/03/2017 9.3 8.6 - 10.2 mg/dL Final         Passed - K in normal range and within 360 days    Potassium  Date Value Ref Range Status  11/03/2017 4.2 3.5 - 5.2 mmol/L Final         Passed - Na in normal range and within 360 days    Sodium  Date Value Ref Range Status  11/03/2017 138 134 - 144 mmol/L Final         Passed - Valid encounter within last 6 months    Recent Outpatient Visits          1 month ago Pre-op evaluation   [place], [place], PA-C   2 months ago Lumbar pain   [place], [place], PA-C   2 months ago Shoulder impingement syndrome, right   [facility] Kathrine Haddock, NP   3 months ago Shoulder impingement syndrome, right   [place], NP   5 months ago Macrocytosis   [facility] Kathrine Haddock, NP      Future Appointments            In 3 weeks Kathrine Haddock, NP [facility], [place]

## 2018-01-22 DIAGNOSIS — S93104D Unspecified dislocation of right toe(s), subsequent encounter: Secondary | ICD-10-CM | POA: Diagnosis not present

## 2018-01-29 ENCOUNTER — Other Ambulatory Visit: Payer: Self-pay | Admitting: Physician Assistant

## 2018-01-29 DIAGNOSIS — M545 Low back pain, unspecified: Secondary | ICD-10-CM

## 2018-01-29 NOTE — Telephone Encounter (Signed)
Has appointment tomorrow with cheryl.  Refill approved.

## 2018-01-30 ENCOUNTER — Ambulatory Visit (INDEPENDENT_AMBULATORY_CARE_PROVIDER_SITE_OTHER): Payer: Medicare Other | Admitting: Unknown Physician Specialty

## 2018-01-30 ENCOUNTER — Encounter: Payer: Self-pay | Admitting: Unknown Physician Specialty

## 2018-01-30 VITALS — BP 147/87 | HR 64 | Temp 98.7°F | Ht 67.0 in | Wt 141.1 lb

## 2018-01-30 DIAGNOSIS — Z23 Encounter for immunization: Secondary | ICD-10-CM | POA: Diagnosis not present

## 2018-01-30 DIAGNOSIS — D7589 Other specified diseases of blood and blood-forming organs: Secondary | ICD-10-CM | POA: Diagnosis not present

## 2018-01-30 DIAGNOSIS — I10 Essential (primary) hypertension: Secondary | ICD-10-CM | POA: Diagnosis not present

## 2018-01-30 DIAGNOSIS — E7800 Pure hypercholesterolemia, unspecified: Secondary | ICD-10-CM | POA: Diagnosis not present

## 2018-01-30 NOTE — Assessment & Plan Note (Addendum)
W55 and folic acid normal.  Increased Folic acid last visit with rheumatology.  Will f/u with rheumatology next month and consider hematology referral

## 2018-01-30 NOTE — Assessment & Plan Note (Signed)
Stable, continue present medications.   

## 2018-01-30 NOTE — Assessment & Plan Note (Signed)
No LDL for a period of time.  Check a lipid panel and CMP

## 2018-01-30 NOTE — Progress Notes (Signed)
BP (!) 147/87 (BP Location: Left Arm, Patient Position: Sitting, Cuff Size: Normal)   Pulse 64   Temp 98.7 F (37.1 C)   Ht 5\' 7"  (1.702 m)   Wt 141 lb 2 oz (64 kg)   SpO2 98%   BMI 22.10 kg/m    Subjective:    Patient ID: Travis Haynes, male    DOB: September 12, 1945, 72 y.o.   MRN: [id number]  HPI: Travis Haynes is a 72 y.o. male  Chief Complaint  Patient presents with  . Hyperlipidemia  . Hypertension   Hypertension Using medications without difficulty Average home BPs 118/87   No problems or lightheadedness No chest pain with exertion or shortness of breath No Edema   Hyperlipidemia Using medications without problems: No Muscle aches  Diet compliance:Exercise:  RA Per rheumatology.  C/o persistent back, LLE, shoulder, elbow, and right wrist.  Taking intermittent Gabapentin.  Doesn't think MTX is working well.    Relevant past medical, surgical, family and social history reviewed and updated as indicated. Interim medical history since our last visit reviewed. Allergies and medications reviewed and updated.  Review of Systems  Per HPI unless specifically indicated above     Objective:    BP (!) 147/87 (BP Location: Left Arm, Patient Position: Sitting, Cuff Size: Normal)   Pulse 64   Temp 98.7 F (37.1 C)   Ht 5\' 7"  (1.702 m)   Wt 141 lb 2 oz (64 kg)   SpO2 98%   BMI 22.10 kg/m   Wt Readings from Last 3 Encounters:  01/30/18 141 lb 2 oz (64 kg)  12/09/17 141 lb 15.6 oz (64.4 kg)  12/08/17 142 lb 2 oz (64.5 kg)    Physical Exam  Constitutional: He is oriented to person, place, and time. He appears well-developed and well-nourished. No distress.  HENT:  Head: Normocephalic and atraumatic.  Eyes: Conjunctivae and lids are normal. Right eye exhibits no discharge. Left eye exhibits no discharge. No scleral icterus.  Neck: Normal range of motion. Neck supple. No JVD present. Carotid bruit is not present.  Cardiovascular: Normal rate, regular rhythm and  normal heart sounds.  Pulmonary/Chest: Effort normal and breath sounds normal. No respiratory distress.  Abdominal: Normal appearance. There is no splenomegaly or hepatomegaly.  Musculoskeletal: Normal range of motion.  Neurological: He is alert and oriented to person, place, and time.  Skin: Skin is warm, dry and intact. No rash noted. No pallor.  Psychiatric: He has a normal mood and affect. His behavior is normal. Judgment and thought content normal.    Results for orders placed or performed during the hospital encounter of 12/09/17  Glucose, capillary  Result Value Ref Range   Glucose-Capillary 85 70 - 99 mg/dL  Surgical pathology  Result Value Ref Range   SURGICAL PATHOLOGY      Surgical Pathology CASE: [id number] PATIENT: Apollos Mohammed Surgical Pathology Report     SPECIMEN SUBMITTED: A. 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  PRE-OPERATIVE DIAGNOSIS: Dislocated phalanx  POST-OPERATIVE DIAGNOSIS: Same as pre-op     DIAGNOSIS: A. BONE, HEAD OF PROXIMAL PHALANX RIGHT GREAT TOE; EXCISION: - DEGENERATIVE CHANGES WITH FIBRINOUS EXUDATE AND PLASMA CELL INFILTRATES, CONSISTENT WITH INFLAMMATORY ARTHROPATHY.   GROSS DESCRIPTION: A. Labeled: Proximal phalanx right great toe Received: In formalin Size: 1.5 x 1.2 x 0.9 cm Description: Pink-tan fragment of bony tissue, one surface suggestive of a  cut surface which is marked black, specimen sectioned  Block summary: 1 - entirely submitted  Tissue decalcification: Yes   Final Diagnosis performed by Bryan Lemma, MD.   Electronically signed 12/13/2017 1:17:51PM The  electronic signature indicates that the named Attending Pathologist has evaluated the specimen  Technical component performed at [facility], [address] [place], [place] [address] Lab: [phone] Dir: Rush Farmer, MD, MMM   Professional component performed at [facility], [facility], [place], [place], [place] [address] Lab: [phone] Dir: Dellia Nims. Rubinas, MD       Assessment & Plan:   Problem List Items Addressed This Visit      Unprioritized   BP (high blood pressure)    Stable, continue present medications.        Relevant Orders   Comprehensive metabolic panel   Hyperlipidemia    No LDL for a period of time.  Check a lipid panel and CMP      Relevant Orders   Lipid Panel w/o Chol/HDL Ratio   Macrocytosis    O17 and folic acid normal.  Increased Folic acid last visit with rheumatology.  Will f/u with rheumatology next month and consider hematology referral       Other Visit Diagnoses    Immunization due    -  Primary   Relevant Orders   Flu vaccine HIGH DOSE PF (Fluzone High dose) (Completed)       Follow up plan: Return in about 6 months (around 07/31/2018).

## 2018-01-31 LAB — COMPREHENSIVE METABOLIC PANEL
ALT: 16 IU/L (ref 0–44)
AST: 18 IU/L (ref 0–40)
Albumin/Globulin Ratio: 2.3 — ABNORMAL HIGH (ref 1.2–2.2)
Albumin: 4.4 g/dL (ref 3.5–4.8)
Alkaline Phosphatase: 76 IU/L (ref 39–117)
BUN/Creatinine Ratio: 13 (ref 10–24)
BUN: 12 mg/dL (ref 8–27)
Bilirubin Total: 0.6 mg/dL (ref 0.0–1.2)
CALCIUM: 9.2 mg/dL (ref 8.6–10.2)
CO2: 25 mmol/L (ref 20–29)
CREATININE: 0.93 mg/dL (ref 0.76–1.27)
Chloride: 94 mmol/L — ABNORMAL LOW (ref 96–106)
GFR calc Af Amer: 95 mL/min/{1.73_m2} (ref >59)
GFR, EST NON AFRICAN AMERICAN: 82 mL/min/{1.73_m2} (ref >59)
Globulin, Total: 1.9 g/dL (ref 1.5–4.5)
Glucose: 91 mg/dL (ref 65–99)
Potassium: 4.5 mmol/L (ref 3.5–5.2)
Sodium: 138 mmol/L (ref 134–144)
Total Protein: 6.3 g/dL (ref 6.0–8.5)

## 2018-01-31 LAB — LIPID PANEL W/O CHOL/HDL RATIO
Cholesterol, Total: 140 mg/dL (ref 100–199)
HDL: 80 mg/dL (ref >39)
LDL CALC: 36 mg/dL (ref 0–99)
TRIGLYCERIDES: 119 mg/dL (ref 0–149)
VLDL Cholesterol Cal: 24 mg/dL (ref 5–40)

## 2018-02-03 ENCOUNTER — Encounter: Payer: Self-pay | Admitting: Unknown Physician Specialty

## 2018-02-09 ENCOUNTER — Other Ambulatory Visit: Payer: Self-pay | Admitting: Unknown Physician Specialty

## 2018-03-13 ENCOUNTER — Other Ambulatory Visit: Payer: Self-pay | Admitting: Unknown Physician Specialty

## 2018-04-03 DIAGNOSIS — M8589 Other specified disorders of bone density and structure, multiple sites: Secondary | ICD-10-CM | POA: Diagnosis not present

## 2018-04-03 DIAGNOSIS — Z7952 Long term (current) use of systemic steroids: Secondary | ICD-10-CM | POA: Diagnosis not present

## 2018-05-15 ENCOUNTER — Other Ambulatory Visit: Payer: Self-pay | Admitting: Unknown Physician Specialty

## 2018-06-02 ENCOUNTER — Telehealth: Payer: Self-pay | Admitting: Unknown Physician Specialty

## 2018-06-02 NOTE — Telephone Encounter (Signed)
Called to schedule Medicare Annual Wellness Visit with the Nurse Health Advisor.  Patient DECLINED AWV. Thank you! For any questions please contact: Janace Hoard at [phone] or Skype [email or web address]

## 2018-07-07 ENCOUNTER — Other Ambulatory Visit: Payer: Self-pay | Admitting: Unknown Physician Specialty

## 2018-07-07 NOTE — Telephone Encounter (Signed)
Requested Prescriptions  Pending Prescriptions Disp Refills  . hydrochlorothiazide (HYDRODIURIL) 25 MG tablet [Pharmacy Med Name: HYDROCHLOROTHIAZIDE 25 MG TAB] 90 tablet 0    Sig: TAKE 1 TABLET BY MOUTH EVERY DAY     Cardiovascular: Diuretics - Thiazide Failed - 07/07/2018  1:10 PM      Failed - Last BP in normal range    BP Readings from Last 1 Encounters:  01/30/18 (!) 147/87         Passed - Ca in normal range and within 360 days    Calcium  Date Value Ref Range Status  01/30/2018 9.2 8.6 - 10.2 mg/dL Final         Passed - Cr in normal range and within 360 days    Creatinine, Ser  Date Value Ref Range Status  01/30/2018 0.93 0.76 - 1.27 mg/dL Final         Passed - K in normal range and within 360 days    Potassium  Date Value Ref Range Status  01/30/2018 4.5 3.5 - 5.2 mmol/L Final         Passed - Na in normal range and within 360 days    Sodium  Date Value Ref Range Status  01/30/2018 138 134 - 144 mmol/L Final         Passed - Valid encounter within last 6 months    Recent Outpatient Visits          5 months ago Immunization due   [facility] Kathrine Haddock, NP   7 months ago Pre-op evaluation   [place], [place], [place]   8 months ago Lumbar pain   [facility] Carles Collet M, [place]   8 months ago Shoulder impingement syndrome, right   [facility] Kathrine Haddock, NP   9 months ago Shoulder impingement syndrome, right   [facility] Kathrine Haddock, NP      Future Appointments            In 4 weeks Cannady, Barbaraann Faster, NP [employer]

## 2018-08-03 ENCOUNTER — Telehealth: Payer: Self-pay | Admitting: Nurse Practitioner

## 2018-08-03 NOTE — Telephone Encounter (Signed)
Called pt to schedule virtual appt twice. The first time a lady answered and said to call back later. Called back, no answer, left voicemail.

## 2018-08-04 ENCOUNTER — Ambulatory Visit: Payer: Medicare Other | Admitting: Nurse Practitioner

## 2018-08-07 ENCOUNTER — Encounter: Payer: Self-pay | Admitting: Nurse Practitioner

## 2018-08-07 ENCOUNTER — Other Ambulatory Visit: Payer: Self-pay

## 2018-08-07 ENCOUNTER — Ambulatory Visit (INDEPENDENT_AMBULATORY_CARE_PROVIDER_SITE_OTHER): Payer: Medicare Other | Admitting: Nurse Practitioner

## 2018-08-07 VITALS — BP 126/82 | HR 98 | Temp 98.5°F | Ht 68.0 in | Wt 149.0 lb

## 2018-08-07 DIAGNOSIS — M069 Rheumatoid arthritis, unspecified: Secondary | ICD-10-CM | POA: Diagnosis not present

## 2018-08-07 DIAGNOSIS — D7589 Other specified diseases of blood and blood-forming organs: Secondary | ICD-10-CM

## 2018-08-07 DIAGNOSIS — I10 Essential (primary) hypertension: Secondary | ICD-10-CM | POA: Diagnosis not present

## 2018-08-07 DIAGNOSIS — E7800 Pure hypercholesterolemia, unspecified: Secondary | ICD-10-CM

## 2018-08-07 NOTE — Progress Notes (Signed)
BP 126/82   Pulse 98   Temp 98.5 F (36.9 C) (Oral)   Ht 5\' 8"  (1.727 m)   Wt 149 lb (67.6 kg)   SpO2 97%   BMI 22.66 kg/m    Subjective:    Patient ID: Travis Haynes, male    DOB: 1945-10-03, 73 y.o.   MRN: [id number]  HPI: Travis Haynes is a 73 y.o. male  Chief Complaint  Patient presents with  . Hypertension    f/u  . Hyperlipidemia   HYPERTENSION / HYPERLIPIDEMIA Continues on Atorvastatin, Amlodipine, and HCTZ.   Satisfied with current treatment? yes Duration of hypertension: chronic BP monitoring frequency: weekly BP range: 115-125/70-80 BP medication side effects: no Duration of hyperlipidemia: chronic Cholesterol medication side effects: no Cholesterol supplements: none Medication compliance: good compliance Aspirin: yes Recent stressors: no Recurrent headaches: no Visual changes: no Palpitations: no Dyspnea: no Chest pain: no Lower extremity edema: no Dizzy/lightheaded: no   RHEUMATOID ARTHRITIS: Visits with specialist at [facility], last seen May 2019 and next sees September 2020 (was recscheduled due to pandemic).  He reports he stopped taking Methotrexate "a long while ago", but continues on Prednisone.  Uses Tramadol PRN for discomfort.  Would like CBC and anemia panel checked today, as he reports "my red blood cells sometimes run big and people say it is good to look at B12".    Relevant past medical, surgical, family and social history reviewed and updated as indicated. Interim medical history since our last visit reviewed. Allergies and medications reviewed and updated.  Review of Systems  Constitutional: Negative for activity change, diaphoresis, fatigue and fever.  Respiratory: Negative for cough, chest tightness, shortness of breath and wheezing.   Cardiovascular: Negative for chest pain, palpitations and leg swelling.  Gastrointestinal: Negative for abdominal distention, abdominal pain, constipation, diarrhea, nausea and vomiting.  Endocrine:  Negative for cold intolerance, heat intolerance, polydipsia, polyphagia and polyuria.  Musculoskeletal: Positive for arthralgias.  Skin: Negative.   Neurological: Negative for dizziness, syncope, weakness, light-headedness, numbness and headaches.  Psychiatric/Behavioral: Negative.     Per HPI unless specifically indicated above     Objective:    BP 126/82   Pulse 98   Temp 98.5 F (36.9 C) (Oral)   Ht 5\' 8"  (1.727 m)   Wt 149 lb (67.6 kg)   SpO2 97%   BMI 22.66 kg/m   Wt Readings from Last 3 Encounters:  08/07/18 149 lb (67.6 kg)  01/30/18 141 lb 2 oz (64 kg)  12/09/17 141 lb 15.6 oz (64.4 kg)    Physical Exam Vitals signs and nursing note reviewed.  Constitutional:      Appearance: He is well-developed.  HENT:     Head: Normocephalic and atraumatic.     Right Ear: Hearing normal. No drainage.     Left Ear: Hearing normal. No drainage.     Mouth/Throat:     Pharynx: Uvula midline.  Eyes:     General: Lids are normal.        Right eye: No discharge.        Left eye: No discharge.     Conjunctiva/sclera: Conjunctivae normal.     Pupils: Pupils are equal, round, and reactive to light.  Neck:     Musculoskeletal: Normal range of motion and neck supple.     Thyroid: No thyromegaly.     Vascular: No carotid bruit or JVD.     Trachea: Trachea normal.  Cardiovascular:     Rate and Rhythm:  Normal rate and regular rhythm.     Heart sounds: Normal heart sounds, S1 normal and S2 normal. No murmur. No gallop.   Pulmonary:     Effort: Pulmonary effort is normal.     Breath sounds: Normal breath sounds.  Abdominal:     General: Bowel sounds are normal.     Palpations: Abdomen is soft. There is no hepatomegaly or splenomegaly.  Musculoskeletal:     Right lower leg: No edema.     Left lower leg: No edema.     Comments: Swan neck deformity fingers bilateral hands.  Walker assistance while mobile.  Skin:    General: Skin is warm and dry.     Capillary Refill: Capillary  refill takes less than 2 seconds.     Findings: No rash.  Neurological:     Mental Status: He is alert and oriented to person, place, and time.     Deep Tendon Reflexes: Reflexes are normal and symmetric.  Psychiatric:        Mood and Affect: Mood normal.        Behavior: Behavior normal.        Thought Content: Thought content normal.        Judgment: Judgment normal.     Results for orders placed or performed in visit on 01/30/18  Comprehensive metabolic panel  Result Value Ref Range   Glucose 91 65 - 99 mg/dL   BUN 12 8 - 27 mg/dL   Creatinine, Ser 0.93 0.76 - 1.27 mg/dL   GFR calc non Af Amer 82 >59 mL/min/1.73   GFR calc Af Amer 95 >59 mL/min/1.73   BUN/Creatinine Ratio 13 10 - 24   Sodium 138 134 - 144 mmol/L   Potassium 4.5 3.5 - 5.2 mmol/L   Chloride 94 (L) 96 - 106 mmol/L   CO2 25 20 - 29 mmol/L   Calcium 9.2 8.6 - 10.2 mg/dL   Total Protein 6.3 6.0 - 8.5 g/dL   Albumin 4.4 3.5 - 4.8 g/dL   Globulin, Total 1.9 1.5 - 4.5 g/dL   Albumin/Globulin Ratio 2.3 (H) 1.2 - 2.2   Bilirubin Total 0.6 0.0 - 1.2 mg/dL   Alkaline Phosphatase 76 39 - 117 IU/L   AST 18 0 - 40 IU/L   ALT 16 0 - 44 IU/L  Lipid Panel w/o Chol/HDL Ratio  Result Value Ref Range   Cholesterol, Total 140 100 - 199 mg/dL   Triglycerides 119 0 - 149 mg/dL   HDL 80 >39 mg/dL   VLDL Cholesterol Cal 24 5 - 40 mg/dL   LDL Calculated 36 0 - 99 mg/dL      Assessment & Plan:   Problem List Items Addressed This Visit      Cardiovascular and Mediastinum   Essential hypertension    Chronic, ongoing.  BP below goal in office and at home.  Continue current medication regimen.  CMP today.      Relevant Orders   CBC with Differential/Platelet   Comprehensive metabolic panel     Musculoskeletal and Integument   Rheumatoid arthritis ([place]) - Primary    Continue to collaborate with rheumatology [facility].  CBC and anemia panel today per patient request.      Relevant Orders   Anemia panel     Other    Hyperlipidemia    Chronic, ongoing.  Continue current medication regimen.  Lipid and CMP today.      Relevant Orders   Lipid Panel w/o Chol/HDL Ratio  Macrocytosis    Anemia panel and CBC today per patient request.          Follow up plan: Return in about 6 months (around 02/07/2019) for HTN/HLD, RA.

## 2018-08-07 NOTE — Assessment & Plan Note (Signed)
Chronic, ongoing.  Continue current medication regimen.  Lipid and CMP today.

## 2018-08-07 NOTE — Assessment & Plan Note (Signed)
Anemia panel and CBC today per patient request.

## 2018-08-07 NOTE — Assessment & Plan Note (Addendum)
Continue to collaborate with rheumatology [facility].  CBC and anemia panel today per patient request.

## 2018-08-07 NOTE — Patient Instructions (Signed)
DASH Eating Plan  DASH stands for "Dietary Approaches to Stop Hypertension." The DASH eating plan is a healthy eating plan that has been shown to reduce high blood pressure (hypertension). It may also reduce your risk for type 2 diabetes, heart disease, and stroke. The DASH eating plan may also help with weight loss.  What are tips for following this plan?    General guidelines   Avoid eating more than 2,300 mg (milligrams) of salt (sodium) a day. If you have hypertension, you may need to reduce your sodium intake to 1,500 mg a day.   Limit alcohol intake to no more than 1 drink a day for nonpregnant women and 2 drinks a day for men. One drink equals 12 oz of beer, 5 oz of wine, or 1 oz of hard liquor.   Work with your health care provider to maintain a healthy body weight or to lose weight. Ask what an ideal weight is for you.   Get at least 30 minutes of exercise that causes your heart to beat faster (aerobic exercise) most days of the week. Activities may include walking, swimming, or biking.   Work with your health care provider or diet and nutrition specialist (dietitian) to adjust your eating plan to your individual calorie needs.  Reading food labels     Check food labels for the amount of sodium per serving. Choose foods with less than 5 percent of the Daily Value of sodium. Generally, foods with less than 300 mg of sodium per serving fit into this eating plan.   To find whole grains, look for the word "whole" as the first word in the ingredient list.  Shopping   Buy products labeled as "low-sodium" or "no salt added."   Buy fresh foods. Avoid canned foods and premade or frozen meals.  Cooking   Avoid adding salt when cooking. Use salt-free seasonings or herbs instead of table salt or sea salt. Check with your health care provider or pharmacist before using salt substitutes.   Do not fry foods. Cook foods using healthy methods such as baking, boiling, grilling, and broiling instead.   Cook with  heart-healthy oils, such as olive, canola, soybean, or sunflower oil.  Meal planning   Eat a balanced diet that includes:  ? 5 or more servings of fruits and vegetables each day. At each meal, try to fill half of your plate with fruits and vegetables.  ? Up to 6-8 servings of whole grains each day.  ? Less than 6 oz of lean meat, poultry, or fish each day. A 3-oz serving of meat is about the same size as a deck of cards. One egg equals 1 oz.  ? 2 servings of low-fat dairy each day.  ? A serving of nuts, seeds, or beans 5 times each week.  ? Heart-healthy fats. Healthy fats called Omega-3 fatty acids are found in foods such as flaxseeds and coldwater fish, like sardines, salmon, and mackerel.   Limit how much you eat of the following:  ? Canned or prepackaged foods.  ? Food that is high in trans fat, such as fried foods.  ? Food that is high in saturated fat, such as fatty meat.  ? Sweets, desserts, sugary drinks, and other foods with added sugar.  ? Full-fat dairy products.   Do not salt foods before eating.   Try to eat at least 2 vegetarian meals each week.   Eat more home-cooked food and less restaurant, buffet, and fast food.     When eating at a restaurant, ask that your food be prepared with less salt or no salt, if possible.  What foods are recommended?  The items listed may not be a complete list. Talk with your dietitian about what dietary choices are best for you.  Grains  Whole-grain or whole-wheat bread. Whole-grain or whole-wheat pasta. Brown rice. Oatmeal. Quinoa. Bulgur. Whole-grain and low-sodium cereals. Pita bread. Low-fat, low-sodium crackers. Whole-wheat flour tortillas.  Vegetables  Fresh or frozen vegetables (raw, steamed, roasted, or grilled). Low-sodium or reduced-sodium tomato and vegetable juice. Low-sodium or reduced-sodium tomato sauce and tomato paste. Low-sodium or reduced-sodium canned vegetables.  Fruits  All fresh, dried, or frozen fruit. Canned fruit in natural juice (without  added sugar).  Meat and other protein foods  Skinless chicken or turkey. Ground chicken or turkey. Pork with fat trimmed off. Fish and seafood. Egg whites. Dried beans, peas, or lentils. Unsalted nuts, nut butters, and seeds. Unsalted canned beans. Lean cuts of beef with fat trimmed off. Low-sodium, lean deli meat.  Dairy  Low-fat (1%) or fat-free (skim) milk. Fat-free, low-fat, or reduced-fat cheeses. Nonfat, low-sodium ricotta or cottage cheese. Low-fat or nonfat yogurt. Low-fat, low-sodium cheese.  Fats and oils  Soft margarine without trans fats. Vegetable oil. Low-fat, reduced-fat, or light mayonnaise and salad dressings (reduced-sodium). Canola, safflower, olive, soybean, and sunflower oils. Avocado.  Seasoning and other foods  Herbs. Spices. Seasoning mixes without salt. Unsalted popcorn and pretzels. Fat-free sweets.  What foods are not recommended?  The items listed may not be a complete list. Talk with your dietitian about what dietary choices are best for you.  Grains  Baked goods made with fat, such as croissants, muffins, or some breads. Dry pasta or rice meal packs.  Vegetables  Creamed or fried vegetables. Vegetables in a cheese sauce. Regular canned vegetables (not low-sodium or reduced-sodium). Regular canned tomato sauce and paste (not low-sodium or reduced-sodium). Regular tomato and vegetable juice (not low-sodium or reduced-sodium). Pickles. Olives.  Fruits  Canned fruit in a light or heavy syrup. Fried fruit. Fruit in cream or butter sauce.  Meat and other protein foods  Fatty cuts of meat. Ribs. Fried meat. Bacon. Sausage. Bologna and other processed lunch meats. Salami. Fatback. Hotdogs. Bratwurst. Salted nuts and seeds. Canned beans with added salt. Canned or smoked fish. Whole eggs or egg yolks. Chicken or turkey with skin.  Dairy  Whole or 2% milk, cream, and half-and-half. Whole or full-fat cream cheese. Whole-fat or sweetened yogurt. Full-fat cheese. Nondairy creamers. Whipped toppings.  Processed cheese and cheese spreads.  Fats and oils  Butter. Stick margarine. Lard. Shortening. Ghee. Bacon fat. Tropical oils, such as coconut, palm kernel, or palm oil.  Seasoning and other foods  Salted popcorn and pretzels. Onion salt, garlic salt, seasoned salt, table salt, and sea salt. Worcestershire sauce. Tartar sauce. Barbecue sauce. Teriyaki sauce. Soy sauce, including reduced-sodium. Steak sauce. Canned and packaged gravies. Fish sauce. Oyster sauce. Cocktail sauce. Horseradish that you find on the shelf. Ketchup. Mustard. Meat flavorings and tenderizers. Bouillon cubes. Hot sauce and Tabasco sauce. Premade or packaged marinades. Premade or packaged taco seasonings. Relishes. Regular salad dressings.  Where to find more information:   National Heart, Lung, and Blood Institute: www.nhlbi.nih.gov   American Heart Association: www.heart.org  Summary   The DASH eating plan is a healthy eating plan that has been shown to reduce high blood pressure (hypertension). It may also reduce your risk for type 2 diabetes, heart disease, and stroke.   With the   DASH eating plan, you should limit salt (sodium) intake to 2,300 mg a day. If you have hypertension, you may need to reduce your sodium intake to 1,500 mg a day.   When on the DASH eating plan, aim to eat more fresh fruits and vegetables, whole grains, lean proteins, low-fat dairy, and heart-healthy fats.   Work with your health care provider or diet and nutrition specialist (dietitian) to adjust your eating plan to your individual calorie needs.  This information is not intended to replace advice given to you by your health care provider. Make sure you discuss any questions you have with your health care provider.  Document Released: 02/28/2011 Document Revised: 03/04/2016 Document Reviewed: 03/04/2016  Elsevier Interactive Patient Education  2019 Elsevier Inc.

## 2018-08-07 NOTE — Assessment & Plan Note (Signed)
Chronic, ongoing.  BP below goal in office and at home.  Continue current medication regimen.  CMP today.

## 2018-08-10 ENCOUNTER — Other Ambulatory Visit: Payer: Self-pay | Admitting: Unknown Physician Specialty

## 2018-08-10 LAB — COMPREHENSIVE METABOLIC PANEL
ALT: 14 IU/L (ref 0–44)
AST: 15 IU/L (ref 0–40)
Albumin/Globulin Ratio: 2.1 (ref 1.2–2.2)
Albumin: 4.6 g/dL (ref 3.7–4.7)
Alkaline Phosphatase: 67 IU/L (ref 39–117)
BUN/Creatinine Ratio: 12 (ref 10–24)
BUN: 13 mg/dL (ref 8–27)
Bilirubin Total: 0.5 mg/dL (ref 0.0–1.2)
CO2: 28 mmol/L (ref 20–29)
Calcium: 9.6 mg/dL (ref 8.6–10.2)
Chloride: 93 mmol/L — ABNORMAL LOW (ref 96–106)
Creatinine, Ser: 1.1 mg/dL (ref 0.76–1.27)
GFR calc Af Amer: 78 mL/min/{1.73_m2} (ref >59)
GFR calc non Af Amer: 67 mL/min/{1.73_m2} (ref >59)
Globulin, Total: 2.2 g/dL (ref 1.5–4.5)
Glucose: 112 mg/dL — ABNORMAL HIGH (ref 65–99)
Potassium: 3.7 mmol/L (ref 3.5–5.2)
Sodium: 138 mmol/L (ref 134–144)
Total Protein: 6.8 g/dL (ref 6.0–8.5)

## 2018-08-10 LAB — CBC WITH DIFFERENTIAL/PLATELET
Basophils Absolute: 0.1 10*3/uL (ref 0.0–0.2)
Basos: 1 %
EOS (ABSOLUTE): 0.2 10*3/uL (ref 0.0–0.4)
Eos: 2 %
Hemoglobin: 14.5 g/dL (ref 13.0–17.7)
Immature Grans (Abs): 0.1 10*3/uL (ref 0.0–0.1)
Immature Granulocytes: 1 %
Lymphocytes Absolute: 1.5 10*3/uL (ref 0.7–3.1)
Lymphs: 15 %
MCH: 34.6 pg — ABNORMAL HIGH (ref 26.6–33.0)
MCHC: 34.1 g/dL (ref 31.5–35.7)
MCV: 101 fL — ABNORMAL HIGH (ref 79–97)
Monocytes Absolute: 1 10*3/uL — ABNORMAL HIGH (ref 0.1–0.9)
Monocytes: 10 %
Neutrophils Absolute: 7 10*3/uL (ref 1.4–7.0)
Neutrophils: 71 %
Platelets: 263 10*3/uL (ref 150–450)
RBC: 4.19 x10E6/uL (ref 4.14–5.80)
RDW: 13.5 % (ref 11.6–15.4)
WBC: 9.8 10*3/uL (ref 3.4–10.8)

## 2018-08-10 LAB — ANEMIA PANEL
Ferritin: 68 ng/mL (ref 30–400)
Folate, Hemolysate: 320 ng/mL
Folate, RBC: 753 ng/mL (ref >498)
Hematocrit: 42.5 % (ref 37.5–51.0)
Iron Saturation: 22 % (ref 15–55)
Iron: 86 ug/dL (ref 38–169)
Retic Ct Pct: 1.6 % (ref 0.6–2.6)
Total Iron Binding Capacity: 389 ug/dL (ref 250–450)
UIBC: 303 ug/dL (ref 111–343)
Vitamin B-12: 217 pg/mL — ABNORMAL LOW (ref 232–1245)

## 2018-08-10 LAB — LIPID PANEL W/O CHOL/HDL RATIO
Cholesterol, Total: 163 mg/dL (ref 100–199)
HDL: 80 mg/dL (ref >39)
LDL Calculated: 52 mg/dL (ref 0–99)
Triglycerides: 156 mg/dL — ABNORMAL HIGH (ref 0–149)
VLDL Cholesterol Cal: 31 mg/dL (ref 5–40)

## 2018-08-11 ENCOUNTER — Encounter: Payer: Self-pay | Admitting: Nurse Practitioner

## 2018-08-11 ENCOUNTER — Telehealth: Payer: Self-pay | Admitting: Nurse Practitioner

## 2018-08-11 DIAGNOSIS — E538 Deficiency of other specified B group vitamins: Secondary | ICD-10-CM

## 2018-08-11 NOTE — Progress Notes (Signed)
Letter of results.

## 2018-08-11 NOTE — Telephone Encounter (Signed)
Discussed recent lab results, mailed letter as well.  Patient is aware to start taking Vitamin B12 1000 MCG daily.

## 2018-09-06 ENCOUNTER — Other Ambulatory Visit: Payer: Self-pay | Admitting: Unknown Physician Specialty

## 2018-09-11 ENCOUNTER — Other Ambulatory Visit: Payer: Self-pay | Admitting: Unknown Physician Specialty

## 2018-09-14 ENCOUNTER — Other Ambulatory Visit (HOSPITAL_COMMUNITY): Payer: Self-pay | Admitting: Family Medicine

## 2018-09-14 ENCOUNTER — Other Ambulatory Visit: Payer: Self-pay | Admitting: Family Medicine

## 2018-09-14 DIAGNOSIS — M5136 Other intervertebral disc degeneration, lumbar region: Secondary | ICD-10-CM | POA: Diagnosis not present

## 2018-09-14 DIAGNOSIS — M545 Low back pain: Secondary | ICD-10-CM | POA: Diagnosis not present

## 2018-09-14 DIAGNOSIS — G8929 Other chronic pain: Secondary | ICD-10-CM | POA: Diagnosis not present

## 2018-09-30 ENCOUNTER — Ambulatory Visit: Payer: Medicare Other

## 2018-10-02 ENCOUNTER — Other Ambulatory Visit: Payer: Self-pay | Admitting: Nurse Practitioner

## 2018-10-21 ENCOUNTER — Other Ambulatory Visit: Payer: Self-pay | Admitting: Unknown Physician Specialty

## 2018-10-21 NOTE — Telephone Encounter (Signed)
Requested Prescriptions  Pending Prescriptions Disp Refills  . omeprazole (PRILOSEC) 20 MG capsule [Pharmacy Med Name: OMEPRAZOLE DR 20 MG CAPSULE] 180 capsule 1    Sig: TAKE 2 CAPS BY MOUTH DAILY     Gastroenterology: Proton Pump Inhibitors Passed - 10/21/2018  9:10 AM      Passed - Valid encounter within last 12 months    Recent Outpatient Visits          2 months ago Rheumatoid arthritis, involving unspecified site, unspecified rheumatoid factor presence ([place])   [place], Beverly T, NP   8 months ago Immunization due   [place], NP   11 months ago Pre-op evaluation   [facility] Carles Collet M, [place]   11 months ago Lumbar pain   [facility] Trinna Post, [place]   1 year ago Shoulder impingement syndrome, right   [facility] Kathrine Haddock, NP      Future Appointments            In 3 months Cannady, Barbaraann Faster, NP [employer]

## 2018-12-07 DIAGNOSIS — H2512 Age-related nuclear cataract, left eye: Secondary | ICD-10-CM | POA: Diagnosis not present

## 2018-12-11 DIAGNOSIS — G8929 Other chronic pain: Secondary | ICD-10-CM | POA: Diagnosis not present

## 2018-12-11 DIAGNOSIS — M545 Low back pain: Secondary | ICD-10-CM | POA: Diagnosis not present

## 2018-12-11 DIAGNOSIS — M5136 Other intervertebral disc degeneration, lumbar region: Secondary | ICD-10-CM | POA: Diagnosis not present

## 2018-12-11 DIAGNOSIS — M5441 Lumbago with sciatica, right side: Secondary | ICD-10-CM | POA: Diagnosis not present

## 2018-12-14 DIAGNOSIS — Z5181 Encounter for therapeutic drug level monitoring: Secondary | ICD-10-CM | POA: Diagnosis not present

## 2018-12-14 DIAGNOSIS — M0579 Rheumatoid arthritis with rheumatoid factor of multiple sites without organ or systems involvement: Secondary | ICD-10-CM | POA: Diagnosis not present

## 2018-12-14 DIAGNOSIS — M810 Age-related osteoporosis without current pathological fracture: Secondary | ICD-10-CM | POA: Diagnosis not present

## 2018-12-21 DIAGNOSIS — H25042 Posterior subcapsular polar age-related cataract, left eye: Secondary | ICD-10-CM | POA: Diagnosis not present

## 2018-12-21 DIAGNOSIS — I10 Essential (primary) hypertension: Secondary | ICD-10-CM | POA: Diagnosis not present

## 2018-12-22 ENCOUNTER — Encounter: Payer: Self-pay | Admitting: *Deleted

## 2018-12-28 ENCOUNTER — Other Ambulatory Visit
Admission: RE | Admit: 2018-12-28 | Discharge: 2018-12-28 | Disposition: A | Discharge status: Home / Self Care | Payer: Medicare Other | Source: Ambulatory Visit | Attending: Ophthalmology | Admitting: Ophthalmology

## 2018-12-28 ENCOUNTER — Other Ambulatory Visit: Payer: Self-pay | Admitting: Nurse Practitioner

## 2018-12-28 ENCOUNTER — Other Ambulatory Visit: Payer: Self-pay

## 2018-12-28 DIAGNOSIS — Z20828 Contact with and (suspected) exposure to other viral communicable diseases: Secondary | ICD-10-CM | POA: Insufficient documentation

## 2018-12-28 DIAGNOSIS — Z01812 Encounter for preprocedural laboratory examination: Secondary | ICD-10-CM | POA: Diagnosis not present

## 2018-12-28 LAB — SARS CORONAVIRUS 2 (TAT 6-24 HRS): SARS Coronavirus 2: NEGATIVE

## 2018-12-31 ENCOUNTER — Encounter: Payer: Self-pay | Admitting: Anesthesiology

## 2018-12-31 ENCOUNTER — Ambulatory Visit: Payer: Medicare Other | Admitting: Anesthesiology

## 2018-12-31 ENCOUNTER — Other Ambulatory Visit: Payer: Self-pay

## 2018-12-31 ENCOUNTER — Encounter
Admission: RE | Disposition: A | Discharge status: Home / Self Care | Payer: Self-pay | Source: Home / Self Care | Attending: Ophthalmology

## 2018-12-31 ENCOUNTER — Ambulatory Visit
Admission: RE | Admit: 2018-12-31 | Discharge: 2018-12-31 | Disposition: A | Discharge status: Home / Self Care | Payer: Medicare Other | Attending: Ophthalmology | Admitting: Ophthalmology

## 2018-12-31 DIAGNOSIS — I10 Essential (primary) hypertension: Secondary | ICD-10-CM | POA: Diagnosis not present

## 2018-12-31 DIAGNOSIS — H2512 Age-related nuclear cataract, left eye: Secondary | ICD-10-CM | POA: Diagnosis not present

## 2018-12-31 DIAGNOSIS — Z79899 Other long term (current) drug therapy: Secondary | ICD-10-CM | POA: Diagnosis not present

## 2018-12-31 DIAGNOSIS — Z7982 Long term (current) use of aspirin: Secondary | ICD-10-CM | POA: Insufficient documentation

## 2018-12-31 DIAGNOSIS — H25042 Posterior subcapsular polar age-related cataract, left eye: Secondary | ICD-10-CM | POA: Diagnosis not present

## 2018-12-31 DIAGNOSIS — E785 Hyperlipidemia, unspecified: Secondary | ICD-10-CM | POA: Diagnosis not present

## 2018-12-31 DIAGNOSIS — H269 Unspecified cataract: Secondary | ICD-10-CM | POA: Diagnosis not present

## 2018-12-31 DIAGNOSIS — K219 Gastro-esophageal reflux disease without esophagitis: Secondary | ICD-10-CM | POA: Diagnosis not present

## 2018-12-31 DIAGNOSIS — M1990 Unspecified osteoarthritis, unspecified site: Secondary | ICD-10-CM | POA: Insufficient documentation

## 2018-12-31 HISTORY — DX: Personal history of other diseases of the digestive system: Z87.19

## 2018-12-31 HISTORY — PX: CATARACT EXTRACTION W/PHACO: SHX586

## 2018-12-31 HISTORY — DX: Claustrophobia: F40.240

## 2018-12-31 SURGERY — PHACOEMULSIFICATION, CATARACT, WITH IOL INSERTION
Anesthesia: General | Site: Eye | Laterality: Left

## 2018-12-31 MED ORDER — MIDAZOLAM HCL 2 MG/2ML IJ SOLN
INTRAMUSCULAR | Status: AC
  Filled 2018-12-31: qty 2

## 2018-12-31 MED ORDER — NEOMYCIN-POLYMYXIN-DEXAMETH 3.5-10000-0.1 OP OINT
TOPICAL_OINTMENT | OPHTHALMIC | Status: AC
  Filled 2018-12-31: qty 3.5

## 2018-12-31 MED ORDER — MOXIFLOXACIN HCL 0.5 % OP SOLN
OPHTHALMIC | Status: DC | PRN
  Administered 2018-12-31: 0.2 mL via OPHTHALMIC

## 2018-12-31 MED ORDER — PHENYLEPHRINE HCL (PRESSORS) 10 MG/ML IV SOLN
INTRAVENOUS | Status: DC | PRN
  Administered 2018-12-31: 150 ug via INTRAVENOUS
  Administered 2018-12-31: 200 ug via INTRAVENOUS
  Administered 2018-12-31: 100 ug via INTRAVENOUS
  Administered 2018-12-31: 150 ug via INTRAVENOUS
  Administered 2018-12-31: 100 ug via INTRAVENOUS

## 2018-12-31 MED ORDER — TETRACAINE HCL 0.5 % OP SOLN
OPHTHALMIC | Status: AC
  Administered 2018-12-31: 09:00:00 1 [drp] via OPHTHALMIC
  Filled 2018-12-31: qty 4

## 2018-12-31 MED ORDER — SUCCINYLCHOLINE CHLORIDE 20 MG/ML IJ SOLN
INTRAMUSCULAR | Status: DC | PRN
  Administered 2018-12-31: 100 mg via INTRAVENOUS

## 2018-12-31 MED ORDER — NA CHONDROIT SULF-NA HYALURON 40-17 MG/ML IO SOLN
INTRAOCULAR | Status: DC | PRN
  Administered 2018-12-31: 1 mL via INTRAOCULAR

## 2018-12-31 MED ORDER — MOXIFLOXACIN HCL 0.5 % OP SOLN: 1.0000 [drp] | Freq: Once | OPHTHALMIC | Status: DC

## 2018-12-31 MED ORDER — NA HYALUR & NA CHOND-NA HYALUR 0.55-0.5 ML IO KIT
PACK | INTRAOCULAR | Status: AC
  Filled 2018-12-31: qty 1.05

## 2018-12-31 MED ORDER — SUGAMMADEX SODIUM 200 MG/2ML IV SOLN
INTRAVENOUS | Status: DC | PRN
  Administered 2018-12-31: 136 mg via INTRAVENOUS

## 2018-12-31 MED ORDER — PROPOFOL 10 MG/ML IV BOLUS
INTRAVENOUS | Status: DC | PRN
  Administered 2018-12-31: 120 mg via INTRAVENOUS

## 2018-12-31 MED ORDER — NA CHONDROIT SULF-NA HYALURON 40-17 MG/ML IO SOLN
INTRAOCULAR | Status: AC
  Filled 2018-12-31: qty 1

## 2018-12-31 MED ORDER — EPINEPHRINE PF 1 MG/ML IJ SOLN
INTRAOCULAR | Status: DC | PRN
  Administered 2018-12-31: 11:00:00 200 mL via OPHTHALMIC

## 2018-12-31 MED ORDER — MOXIFLOXACIN HCL 0.5 % OP SOLN
OPHTHALMIC | Status: AC
  Filled 2018-12-31: qty 3

## 2018-12-31 MED ORDER — MIDAZOLAM HCL 2 MG/2ML IJ SOLN
INTRAMUSCULAR | Status: DC | PRN
  Administered 2018-12-31: 1 mg via INTRAVENOUS

## 2018-12-31 MED ORDER — POVIDONE-IODINE 5 % OP SOLN
OPHTHALMIC | Status: AC
  Filled 2018-12-31: qty 30

## 2018-12-31 MED ORDER — NA HYALUR & NA CHOND-NA HYALUR 0.55-0.5 ML IO KIT
PACK | INTRAOCULAR | Status: DC | PRN
  Administered 2018-12-31: 1 via INTRAOCULAR

## 2018-12-31 MED ORDER — POVIDONE-IODINE 5 % OP SOLN
OPHTHALMIC | Status: DC | PRN
  Administered 2018-12-31: 1 via OPHTHALMIC

## 2018-12-31 MED ORDER — SODIUM CHLORIDE 0.9 % IV SOLN
INTRAVENOUS | Status: DC
  Administered 2018-12-31 (×2): via INTRAVENOUS

## 2018-12-31 MED ORDER — TETRACAINE HCL 0.5 % OP SOLN
1.0000 [drp] | Freq: Once | OPHTHALMIC | Status: AC
  Administered 2018-12-31 (×2): 1 [drp] via OPHTHALMIC

## 2018-12-31 MED ORDER — FENTANYL CITRATE (PF) 100 MCG/2ML IJ SOLN
INTRAMUSCULAR | Status: AC
  Filled 2018-12-31: qty 2

## 2018-12-31 MED ORDER — LIDOCAINE HCL (PF) 4 % IJ SOLN
INTRAMUSCULAR | Status: AC
  Filled 2018-12-31: qty 5

## 2018-12-31 MED ORDER — TRYPAN BLUE 0.06 % OP SOLN
OPHTHALMIC | Status: AC
  Filled 2018-12-31: qty 0.5

## 2018-12-31 MED ORDER — EPHEDRINE SULFATE 50 MG/ML IJ SOLN
INTRAMUSCULAR | Status: DC | PRN
  Administered 2018-12-31: 15 mg via INTRAVENOUS

## 2018-12-31 MED ORDER — ONDANSETRON HCL 4 MG/2ML IJ SOLN: 4.0000 mg | Freq: Once | INTRAMUSCULAR | Status: DC | PRN

## 2018-12-31 MED ORDER — PROPOFOL 10 MG/ML IV BOLUS
INTRAVENOUS | Status: AC
  Filled 2018-12-31: qty 20

## 2018-12-31 MED ORDER — ROCURONIUM BROMIDE 100 MG/10ML IV SOLN
INTRAVENOUS | Status: DC | PRN
  Administered 2018-12-31: 5 mg via INTRAVENOUS
  Administered 2018-12-31: 15 mg via INTRAVENOUS

## 2018-12-31 MED ORDER — FENTANYL CITRATE (PF) 100 MCG/2ML IJ SOLN
INTRAMUSCULAR | Status: DC | PRN
  Administered 2018-12-31: 50 ug via INTRAVENOUS

## 2018-12-31 MED ORDER — ROCURONIUM BROMIDE 50 MG/5ML IV SOLN
INTRAVENOUS | Status: AC
  Filled 2018-12-31: qty 1

## 2018-12-31 MED ORDER — SUCCINYLCHOLINE CHLORIDE 20 MG/ML IJ SOLN
INTRAMUSCULAR | Status: AC
  Filled 2018-12-31: qty 1

## 2018-12-31 MED ORDER — EPINEPHRINE PF 1 MG/ML IJ SOLN
INTRAMUSCULAR | Status: AC
  Filled 2018-12-31: qty 2

## 2018-12-31 MED ORDER — TRYPAN BLUE 0.06 % OP SOLN
OPHTHALMIC | Status: DC | PRN
  Administered 2018-12-31: 0.5 mL via INTRAOCULAR

## 2018-12-31 MED ORDER — ARMC OPHTHALMIC DILATING DROPS
OPHTHALMIC | Status: AC
  Administered 2018-12-31: 1 via OPHTHALMIC
  Filled 2018-12-31: qty 0.5

## 2018-12-31 MED ORDER — FENTANYL CITRATE (PF) 100 MCG/2ML IJ SOLN: 25.0000 ug | INTRAMUSCULAR | Status: DC | PRN

## 2018-12-31 MED ORDER — LIDOCAINE HCL (PF) 4 % IJ SOLN
INTRAOCULAR | Status: DC | PRN
  Administered 2018-12-31: 11:00:00 2.5 mL via OPHTHALMIC

## 2018-12-31 MED ORDER — ARMC OPHTHALMIC DILATING DROPS
1.0000 "application " | OPHTHALMIC | Status: AC
  Administered 2018-12-31 (×3): 1 via OPHTHALMIC

## 2018-12-31 MED ORDER — LIDOCAINE HCL (CARDIAC) PF 100 MG/5ML IV SOSY
PREFILLED_SYRINGE | INTRAVENOUS | Status: DC | PRN
  Administered 2018-12-31: 60 mg via INTRAVENOUS

## 2018-12-31 SURGICAL SUPPLY — 20 items
BNDG EYE OVAL (GAUZE/BANDAGES/DRESSINGS) ×4 IMPLANT
DISSECTOR HYDRO NUCLEUS 50X22 (MISCELLANEOUS) ×12 IMPLANT
DRSG TEGADERM 2-3/8X2-3/4 SM (GAUZE/BANDAGES/DRESSINGS) ×3 IMPLANT
GLOVE BIOGEL M 6.5 STRL (GLOVE) ×3 IMPLANT
GOWN STRL REUS W/ TWL LRG LVL3 (GOWN DISPOSABLE) ×1 IMPLANT
GOWN STRL REUS W/ TWL XL LVL3 (GOWN DISPOSABLE) ×1 IMPLANT
GOWN STRL REUS W/TWL LRG LVL3 (GOWN DISPOSABLE) ×2
GOWN STRL REUS W/TWL XL LVL3 (GOWN DISPOSABLE) ×2
KNIFE 45D UP 2.3 (MISCELLANEOUS) ×3 IMPLANT
LABEL CATARACT MEDS ST (LABEL) ×3 IMPLANT
LENS IOL TECNIS ITEC 20.0 (Intraocular Lens) ×2 IMPLANT
PACK CATARACT (MISCELLANEOUS) ×3 IMPLANT
PACK CATARACT KING (MISCELLANEOUS) ×3 IMPLANT
PACK EYE AFTER SURG (MISCELLANEOUS) ×3 IMPLANT
SOL BAL SALT 15ML (MISCELLANEOUS) ×3
SOL BSS BAG (MISCELLANEOUS) ×3
SOLUTION BAL SALT 15ML (MISCELLANEOUS) IMPLANT
SOLUTION BSS BAG (MISCELLANEOUS) ×1 IMPLANT
WATER STERILE IRR 250ML POUR (IV SOLUTION) ×3 IMPLANT
WIPE NON LINTING 3.25X3.25 (MISCELLANEOUS) ×3 IMPLANT

## 2018-12-31 NOTE — Anesthesia Postprocedure Evaluation (Signed)
Anesthesia Post Note  Patient: Travis Haynes  Procedure(s) Performed: CATARACT EXTRACTION PHACO AND INTRAOCULAR LENS PLACEMENT ([place]) LEFT VISION BLUE (Left Eye)  Patient location during evaluation: PACU Anesthesia Type: General Level of consciousness: awake and alert and oriented Pain management: pain level controlled Vital Signs Assessment: post-procedure vital signs reviewed and stable Respiratory status: spontaneous breathing Cardiovascular status: blood pressure returned to baseline Postop Assessment: no headache Anesthetic complications: no     Last Vitals:  Vitals:   12/31/18 1128 12/31/18 1137  BP: 128/72 135/76  Pulse: 87 87  Resp: 17 18  Temp: (!) 36.2 C (!) 36.2 C  SpO2: 97%     Last Pain:  Vitals:   12/31/18 1137  TempSrc: Temporal  PainSc: 0-No pain                 Hayde Kilgour

## 2018-12-31 NOTE — Discharge Instructions (Signed)
Eye Surgery Discharge Instructions    Expect mild scratchy sensation or mild soreness. DO NOT RUB YOUR EYE!  The day of surgery:  Minimal physical activity, but bed rest is not required  No reading, computer work, or close hand work  No bending, lifting, or straining.  May watch TV  For 24 hours:  No driving, legal decisions, or alcoholic beverages  Safety precautions  Eat anything you prefer: It is better to start with liquids, then soup then solid foods.  _____ Eye patch should be worn until postoperative exam tomorrow.  ____ Solar shield eyeglasses should be worn for comfort in the sunlight/patch while sleeping  Resume all regular medications including aspirin or Coumadin if these were discontinued prior to surgery. You may shower, bathe, shave, or wash your hair. Tylenol may be taken for mild discomfort.  Call your doctor if you experience significant pain, nausea, or vomiting, fever > 101 or other signs of infection. [phone] or [phone] Specific instructions:  Follow-up Information    Marchia Meiers, MD Follow up on 01/01/2019.   Specialty: Ophthalmology Why: @ 1:40 pm for post op visit Contact information: [address] [place] [address] [phone]

## 2018-12-31 NOTE — Anesthesia Preprocedure Evaluation (Signed)
Anesthesia Evaluation  Patient identified by MRN, date of birth, ID band Patient awake    Reviewed: Allergy & Precautions, H&P , NPO status , Patient's Chart, lab work & pertinent test results  History of Anesthesia Complications Negative for: history of anesthetic complications  Airway Mallampati: III  TM Distance: <3 FB Neck ROM: full    Dental  (+) Edentulous Upper, Edentulous Lower   Pulmonary neg shortness of breath, former smoker,           Cardiovascular Exercise Tolerance: Good hypertension, (-) angina+ CAD  (-) DOE      Neuro/Psych negative neurological ROS  negative psych ROS   GI/Hepatic Neg liver ROS, hiatal hernia, GERD  Medicated and Controlled,  Endo/Other  negative endocrine ROS  Renal/GU      Musculoskeletal  (+) Arthritis ,   Abdominal   Peds  Hematology negative hematology ROS (+)   Anesthesia Other Findings Past Medical History: No date: Arthritis     Comment:  rheumatoid arthritis, takes methotrexate and prednisone               for this No date: Barrett's esophagus 1999: Cancer ([place])     Comment:  non hodgkins lymphoma No date: Chronic back pain No date: Chronic pain syndrome No date: Coronary artery disease No date: GERD (gastroesophageal reflux disease)     Comment:  barretts esophagus that requires dilation every couple               years No date: Hemorrhoids No date: Hernia of abdominal cavity No date: Hyperlipidemia No date: Hypertension No date: Lumbago 11/2017: Weight loss     Comment:  30 lb weight loss over 3 years  Past Surgical History: 1954: [place]: BACK SURGERY     Comment:  2 fusions then 1 surgery to fix the other two               (lower)..plates and screws 075-GRM: ESOPHAGOGASTRODUODENOSCOPY (EGD) WITH PROPOFOL; N/A     Comment:  Procedure: ESOPHAGOGASTRODUODENOSCOPY (EGD) WITH               PROPOFOL;  Surgeon: Hulen Luster, MD;  Location:  [facility]               ENDOSCOPY;  Service: Gastroenterology;  Laterality: N/A; 1998: intestine repair     Comment:  during biopsy his intestines were nicked requiring               repair 1996: NECK SURGERY     Comment:  fused 2 vertebrae in neck (plate plus screws) No date: Reduction masseter muscle/bone extraoral  BMI    Body Mass Index:  21.59 kg/m      Reproductive/Obstetrics negative OB ROS                             Anesthesia Physical  Anesthesia Plan  ASA: III  Anesthesia Plan: General   Post-op Pain Management:    Induction: Intravenous  PONV Risk Score and Plan:   Airway Management Planned: Oral ETT  Additional Equipment:   Intra-op Plan:   Post-operative Plan: Extubation in OR  Informed Consent: I have reviewed the patients History and Physical, chart, labs and discussed the procedure including the risks, benefits and alternatives for the proposed anesthesia with the patient or authorized representative who has indicated his/her understanding and acceptance.     Dental Advisory Given  Plan Discussed with: Anesthesiologist, CRNA  and Surgeon  Anesthesia Plan Comments: (Patient consented for risks of anesthesia including but not limited to:  - adverse reactions to medications - damage to teeth, lips or other oral mucosa - sore throat or hoarseness - Damage to heart, brain, lungs or loss of life  Patient voiced understanding.)        Anesthesia Quick Evaluation

## 2018-12-31 NOTE — Anesthesia Post-op Follow-up Note (Signed)
Anesthesia QCDR form completed.        

## 2018-12-31 NOTE — Op Note (Addendum)
PREOPERATIVE DIAGNOSIS:  Nuclear sclerotic cataract of the LEFT eye.   POSTOPERATIVE DIAGNOSIS:  Nuclear sclerotic cataract of the LEFT eye.   OPERATIVE PROCEDURE: Cataract surgery OS   SURGEON:  Marchia Meiers, MD.   ANESTHESIA:  Anesthesiologist: Alvin Critchley, MD CRNA: Allean Found, CRNA  1.      Managed anesthesia care. 2.     0.46ml of Shugarcaine was instilled following the paracentesis   COMPLICATIONS:  None.   TECHNIQUE:   Divide and conquer   DESCRIPTION OF PROCEDURE:  The patient was examined and consented in the preoperative holding area where the aforementioned topical anesthesia was applied to the LEFT eye and then brought back to the Operating Room where the left eye was prepped and draped in the usual sterile ophthalmic fashion and a lid speculum was placed. A paracentesis was created with the side port blade, the anterior chamber was washed out with trypan blue to stain the anterior capsule, and the anterior chamber was filled with viscoelastic. A near clear corneal incision was performed with the steel keratome. A continuous curvilinear capsulorrhexis was performed with a cystotome followed by the capsulorrhexis forceps. Hydrodissection and hydrodelineation were carried out with BSS on a blunt cannula. The lens was removed in a divide and conquer  technique and the remaining cortical material was removed with the irrigation-aspiration handpiece. The capsular bag was inflated with viscoelastic and the lens was placed in the capsular bag without complication. The remaining viscoelastic was removed from the eye with the irrigation-aspiration handpiece. The wounds were hydrated. The anterior chamber was flushed and the eye was inflated to physiologic pressure. 0.16ml Vigamox was placed in the anterior chamber. The wounds were found to be water tight. The eye was dressed with Vigamox. The patient was given protective glasses to wear throughout the day and a shield with which to sleep  tonight. The patient was also given drops with which to begin a drop regimen today and will follow-up with me in one day. Implant Name Type Inv. Item Serial No. Manufacturer Lot No. LRB No. Used Action  LENS IOL DIOP 20.0 - [id number] Intraocular Lens LENS IOL DIOP 20.0 [id number] AMO  Left 1 Wasted  LENS IOL DIOP 20.0 - [id number] Intraocular Lens LENS IOL DIOP 20.0 [id number] AMO  Left 1 Implanted    Procedure(s) with comments: CATARACT EXTRACTION PHACO AND INTRAOCULAR LENS PLACEMENT (IOC) LEFT VISION BLUE (Left) - Lot [id number] H [place]: 01:51.7 CDE: 18.59  Electronically signed: Marchia Meiers 12/31/2018 12:31 PM

## 2018-12-31 NOTE — H&P (Signed)
   I have reviewed the patient's H&P and agree with its findings. There have been no interval changes.  Erika Slaby MD Ophthalmology 

## 2018-12-31 NOTE — Transfer of Care (Signed)
Immediate Anesthesia Transfer of Care Note  Patient: Travis Haynes  Procedure(s) Performed: CATARACT EXTRACTION PHACO AND INTRAOCULAR LENS PLACEMENT (IOC) LEFT VISION BLUE (Left Eye)  Patient Location: PACU  Anesthesia Type:General  Level of Consciousness: awake and alert   Airway & Oxygen Therapy: Patient Spontanous Breathing and Patient connected to face mask oxygen  Post-op Assessment: Report given to RN and Post -op Vital signs reviewed and stable  Post vital signs: Reviewed and stable  Last Vitals:  Vitals Value Taken Time  BP 140/75 12/31/18 1109  Temp 36.1 C 12/31/18 1109  Pulse 81 12/31/18 1112  Resp 18 12/31/18 1112  SpO2 100 % 12/31/18 1112  Vitals shown include unvalidated device data.  Last Pain:  Vitals:   12/31/18 0909  TempSrc: Tympanic  PainSc: 4          Complications: No apparent anesthesia complications

## 2019-01-01 ENCOUNTER — Encounter: Payer: Self-pay | Admitting: Ophthalmology

## 2019-01-20 ENCOUNTER — Other Ambulatory Visit: Payer: Self-pay | Admitting: Physical Medicine and Rehabilitation

## 2019-01-20 DIAGNOSIS — M5416 Radiculopathy, lumbar region: Secondary | ICD-10-CM

## 2019-02-01 ENCOUNTER — Other Ambulatory Visit: Payer: Self-pay | Admitting: Family Medicine

## 2019-02-01 ENCOUNTER — Telehealth: Payer: Self-pay | Admitting: *Deleted

## 2019-02-01 DIAGNOSIS — M5136 Other intervertebral disc degeneration, lumbar region: Secondary | ICD-10-CM

## 2019-02-01 DIAGNOSIS — G8929 Other chronic pain: Secondary | ICD-10-CM

## 2019-02-04 ENCOUNTER — Other Ambulatory Visit: Payer: Self-pay | Admitting: Nurse Practitioner

## 2019-02-04 NOTE — Telephone Encounter (Signed)
Forwarding medication refill request to PCP for review. 

## 2019-02-10 ENCOUNTER — Ambulatory Visit: Payer: Medicare Other | Admitting: Nurse Practitioner

## 2019-02-11 ENCOUNTER — Encounter: Payer: Self-pay | Admitting: Nurse Practitioner

## 2019-02-11 ENCOUNTER — Ambulatory Visit (INDEPENDENT_AMBULATORY_CARE_PROVIDER_SITE_OTHER): Payer: Medicare Other | Admitting: Nurse Practitioner

## 2019-02-11 ENCOUNTER — Other Ambulatory Visit: Payer: Self-pay

## 2019-02-11 VITALS — BP 122/79 | HR 87

## 2019-02-11 DIAGNOSIS — E538 Deficiency of other specified B group vitamins: Secondary | ICD-10-CM | POA: Diagnosis not present

## 2019-02-11 DIAGNOSIS — E7800 Pure hypercholesterolemia, unspecified: Secondary | ICD-10-CM

## 2019-02-11 DIAGNOSIS — I10 Essential (primary) hypertension: Secondary | ICD-10-CM

## 2019-02-11 DIAGNOSIS — M069 Rheumatoid arthritis, unspecified: Secondary | ICD-10-CM | POA: Diagnosis not present

## 2019-02-11 NOTE — Assessment & Plan Note (Signed)
Check B12 level outpatient and continue daily supplement.

## 2019-02-11 NOTE — Assessment & Plan Note (Addendum)
Continue to collaborate with rheumatology [facility].  CBC and anemia panel outpatient per patient request.

## 2019-02-11 NOTE — Assessment & Plan Note (Signed)
Chronic, ongoing.  Continue current medication regimen and adjust as needed.  Lipid and CMP outpatient.

## 2019-02-11 NOTE — Progress Notes (Signed)
BP 122/79   Pulse 87    Subjective:    Patient ID: Travis Haynes, male    DOB: September 05, 1945, 73 y.o.   MRN: [id number]  HPI: Travis Haynes is a 73 y.o. male  Chief Complaint  Patient presents with  . Hyperlipidemia  . Hypertension  . Rheumatoid Arthritis    . This visit was completed via telephone due to the restrictions of the COVID-19 pandemic. All issues as above were discussed and addressed but no physical exam was performed. If it was felt that the patient should be evaluated in the office, they were directed there. The patient verbally consented to this visit. Patient was unable to complete an audio/visual visit due to Lack of equipment. Due to the catastrophic nature of the COVID-19 pandemic, this visit was done through audio contact only. . Location of the patient: home . Location of the provider: home . Those involved with this call:  . Provider: Marnee Guarneri, DNP . CMA: Yvonna Alanis, CMA . Front Desk/Registration: Jill Side  . Time spent on call: 15 minutes on the phone discussing health concerns. 10 minutes total spent in review of patient's record and preparation of their chart.  . I verified patient identity using two factors (patient name and date of birth). Patient consents verbally to being seen via telemedicine visit today.    HYPERTENSION / HYPERLIPIDEMIA Continues on Atorvastatin, Amlodipine, and HCTZ.  Endorses some intermittent swelling to top of feet and ankles, but feels this may be related to sodium intake and is working on cutting back as the swelling improves with this and at end of day. Denies palpitations, SOB, or CP. Satisfied with current treatment? yes Duration of hypertension: chronic BP monitoring frequency: weekly BP range: 115-125/70-80 BP medication side effects: no Duration of hyperlipidemia: chronic Cholesterol medication side effects: no Cholesterol supplements: none Medication compliance: good compliance Aspirin: yes Recent  stressors: no Recurrent headaches: no Visual changes: no Palpitations: no Dyspnea: no Chest pain: no Lower extremity edema: occasional top of feet and ankles Dizzy/lightheaded: no   RHEUMATOID ARTHRITIS: Visits with specialist at [facility], last seen September 2020.  He was ordered Fosamax and Arava at recent visit, but has not taken as was going for cataract surgery and did not want to start new medication prior to this.  He does not plan on starting until his next rheumatology visit, as wishes to further discuss side effects with them.  Uses Tramadol PRN for discomfort.  Would like his B12 level and labs checked outpatient.  Relevant past medical, surgical, family and social history reviewed and updated as indicated. Interim medical history since our last visit reviewed. Allergies and medications reviewed and updated.  Review of Systems  Constitutional: Negative for activity change, diaphoresis, fatigue and fever.  Respiratory: Negative for cough, chest tightness, shortness of breath and wheezing.   Cardiovascular: Negative for chest pain, palpitations and leg swelling.  Gastrointestinal: Negative for abdominal distention, abdominal pain, constipation, diarrhea, nausea and vomiting.  Endocrine: Negative for cold intolerance, heat intolerance, polydipsia, polyphagia and polyuria.  Musculoskeletal: Positive for arthralgias.  Skin: Negative.   Neurological: Negative for dizziness, syncope, weakness, light-headedness, numbness and headaches.  Psychiatric/Behavioral: Negative.     Per HPI unless specifically indicated above     Objective:    BP 122/79   Pulse 87   Wt Readings from Last 3 Encounters:  12/31/18 149 lb 14.6 oz (68 kg)  08/07/18 149 lb (67.6 kg)  01/30/18 141 lb 2 oz (64 kg)  Physical Exam   Unable to perform due to telephone visit only.  Results for orders placed or performed during the hospital encounter of 12/28/18  SARS CORONAVIRUS 2 (TAT 6-24 HRS) Nasopharyngeal  Nasopharyngeal Swab   Specimen: Nasopharyngeal Swab  Result Value Ref Range   SARS Coronavirus 2 NEGATIVE NEGATIVE      Assessment & Plan:   Problem List Items Addressed This Visit      Cardiovascular and Mediastinum   Essential hypertension    Chronic, ongoing.  BP below goal on home readings.  Continue current medication regimen and adjust as needed.  Obtain outpatient labs and focus on DASH diet at home.  If worsening edema he is to come to office immediately for further assessment.      Relevant Orders   Comprehensive metabolic panel     Musculoskeletal and Integument   Rheumatoid arthritis ([place]) - Primary    Continue to collaborate with rheumatology [facility].  CBC and anemia panel outpatient per patient request.      Relevant Orders   Comprehensive metabolic panel     Other   Hyperlipidemia    Chronic, ongoing.  Continue current medication regimen and adjust as needed.  Lipid and CMP outpatient.      Relevant Orders   Comprehensive metabolic panel   Lipid Panel w/o Chol/HDL Ratio out   Vitamin B12 deficiency    Check B12 level outpatient and continue daily supplement.      Relevant Orders   Anemia panel   CBC with Differential/Platelet out      I discussed the assessment and treatment plan with the patient. The patient was provided an opportunity to ask questions and all were answered. The patient agreed with the plan and demonstrated an understanding of the instructions.   The patient was advised to call back or seek an in-person evaluation if the symptoms worsen or if the condition fails to improve as anticipated.   I provided 15 minutes of time during this encounter.  Follow up plan: Return in about 3 months (around 05/14/2019) for HTN/HLD, GERD, Rheumatoid.

## 2019-02-11 NOTE — Assessment & Plan Note (Signed)
Chronic, ongoing.  BP below goal on home readings.  Continue current medication regimen and adjust as needed.  Obtain outpatient labs and focus on DASH diet at home.  If worsening edema he is to come to office immediately for further assessment.

## 2019-02-11 NOTE — Patient Instructions (Signed)
Rheumatoid Arthritis Rheumatoid arthritis (RA) is a long-term (chronic) disease. RA causes inflammation in your joints. Your joints may feel painful, stiff, swollen, and warm. RA may start slowly. It most often affects the small joints of the hands and feet. It can also affect other parts of the body. Symptoms of RA often come and go. There is no cure for RA, but medicines can help your symptoms. What are the causes?  RA is an autoimmune disease. This means that your body's defense system (immune system) attacks healthy parts of your body by mistake. The exact cause of RA is not known. What increases the risk?  Being a woman.  Having a family history of RA or other diseases like RA.  Smoking.  Being overweight.  Being exposed to pollutants or chemicals. What are the signs or symptoms?  Morning stiffness that lasts longer than 30 minutes. This is often the first symptom.  Symptoms start slowly. They are often worse in the morning.  As RA gets worse, symptoms may include: ? Pain, stiffness, swelling, warmth, and tenderness in joints on both sides of your body. ? Loss of energy. ? Not feeling hungry. ? Weight loss. ? A low fever. ? Dry eyes and a dry mouth. ? Firm lumps that grow under your skin. ? Changes in the way your joints look. ? Changes in the way your joints work.  Symptoms vary and they: ? Often come and go. ? Sometimes get worse for a period of time. These are called flares. How is this treated?   Treatment may include: ? Taking good care of yourself. Be sure to rest as needed, eat a healthy diet, and exercise. ? Medicines. These may include:  Pain relievers.  Medicines to help with inflammation.  Disease-modifying antirheumatic drugs (DMARDs).  Medicines called biologic response modifiers. ? Physical therapy and occupational therapy. ? Surgery, if joint damage is very bad. Your doctor will work with you to find the best treatments. Follow these  instructions at home: Activity  Return to your normal activities as told by your doctor. Ask your doctor what activities are safe for you.  Rest when you have a flare.  Exercise as told by your doctor. General instructions  Take over-the-counter and prescription medicines only as told by your doctor.  Keep all follow-up visits as told by your doctor. This is important. Where to find more information  American College of Rheumatology: www.rheumatology.org  Arthritis Foundation: www.arthritis.org Contact a doctor if:  You have a flare.  You have a fever.  You have problems because of your medicines. Get help right away if:  You have chest pain.  You have trouble breathing.  You get a hot, painful joint all of a sudden, and it is worse than your normal joint aches. Summary  RA is a long-term disease.  Symptoms of RA start slowly. They are often worse in the morning.  RA causes inflammation in your joints. This information is not intended to replace advice given to you by your health care provider. Make sure you discuss any questions you have with your health care provider. Document Released: 06/03/2011 Document Revised: 11/12/2017 Document Reviewed: 11/12/2017 Elsevier Patient Education  2020 Elsevier Inc.  

## 2019-02-13 ENCOUNTER — Other Ambulatory Visit: Payer: Medicare Other

## 2019-02-16 ENCOUNTER — Telehealth: Payer: Self-pay | Admitting: Nurse Practitioner

## 2019-02-16 NOTE — Chronic Care Management (AMB) (Signed)
°  Chronic Care Management   Outreach Note  02/16/2019 Name: Travis Haynes MRN: [id number] DOB: 10/14/1945  Referred by: Venita Lick, NP Reason for referral : Chronic Care Management (Initial CCM outreach was unsuccessful. )   An unsuccessful telephone outreach was attempted today. The patient was referred to the case management team by for assistance with care management and care coordination.   Follow Up Plan: The care management team will reach out to the patient again over the next 7 days.   [place], [place] [address] Direct Dial: [email or web address]  Website: [email or web address]

## 2019-02-16 NOTE — Telephone Encounter (Signed)
Called pt to set up 3 month fu with jolene, no answer, left vm

## 2019-02-24 ENCOUNTER — Ambulatory Visit (INDEPENDENT_AMBULATORY_CARE_PROVIDER_SITE_OTHER): Payer: Medicare Other

## 2019-02-24 ENCOUNTER — Telehealth: Payer: Self-pay | Admitting: Nurse Practitioner

## 2019-02-24 ENCOUNTER — Other Ambulatory Visit: Payer: Medicare Other

## 2019-02-24 ENCOUNTER — Encounter: Payer: Self-pay | Admitting: Nurse Practitioner

## 2019-02-24 ENCOUNTER — Other Ambulatory Visit: Payer: Self-pay

## 2019-02-24 DIAGNOSIS — E7800 Pure hypercholesterolemia, unspecified: Secondary | ICD-10-CM | POA: Diagnosis not present

## 2019-02-24 DIAGNOSIS — I10 Essential (primary) hypertension: Secondary | ICD-10-CM | POA: Diagnosis not present

## 2019-02-24 DIAGNOSIS — Z23 Encounter for immunization: Secondary | ICD-10-CM

## 2019-02-24 DIAGNOSIS — M069 Rheumatoid arthritis, unspecified: Secondary | ICD-10-CM

## 2019-02-24 DIAGNOSIS — E538 Deficiency of other specified B group vitamins: Secondary | ICD-10-CM | POA: Diagnosis not present

## 2019-02-24 NOTE — Telephone Encounter (Signed)
Called pt to schedule f/u and lab work, no answer, left vm. Sending letter

## 2019-02-26 LAB — COMPREHENSIVE METABOLIC PANEL
ALT: 15 IU/L (ref 0–44)
AST: 17 IU/L (ref 0–40)
Albumin/Globulin Ratio: 2 (ref 1.2–2.2)
Albumin: 4.4 g/dL (ref 3.7–4.7)
Alkaline Phosphatase: 71 IU/L (ref 39–117)
BUN/Creatinine Ratio: 14 (ref 10–24)
BUN: 16 mg/dL (ref 8–27)
Bilirubin Total: 0.5 mg/dL (ref 0.0–1.2)
CO2: 26 mmol/L (ref 20–29)
Calcium: 9.5 mg/dL (ref 8.6–10.2)
Chloride: 95 mmol/L — ABNORMAL LOW (ref 96–106)
Creatinine, Ser: 1.11 mg/dL (ref 0.76–1.27)
GFR calc Af Amer: 76 mL/min/{1.73_m2} (ref >59)
GFR calc non Af Amer: 66 mL/min/{1.73_m2} (ref >59)
Globulin, Total: 2.2 g/dL (ref 1.5–4.5)
Glucose: 125 mg/dL — ABNORMAL HIGH (ref 65–99)
Potassium: 4.1 mmol/L (ref 3.5–5.2)
Sodium: 139 mmol/L (ref 134–144)
Total Protein: 6.6 g/dL (ref 6.0–8.5)

## 2019-02-26 LAB — LIPID PANEL W/O CHOL/HDL RATIO
Cholesterol, Total: 167 mg/dL (ref 100–199)
HDL: 90 mg/dL (ref >39)
LDL Chol Calc (NIH): 55 mg/dL (ref 0–99)
Triglycerides: 131 mg/dL (ref 0–149)
VLDL Cholesterol Cal: 22 mg/dL (ref 5–40)

## 2019-02-26 LAB — CBC WITH DIFFERENTIAL/PLATELET
Basophils Absolute: 0.1 10*3/uL (ref 0.0–0.2)
Basos: 1 %
EOS (ABSOLUTE): 0 10*3/uL (ref 0.0–0.4)
Eos: 0 %
Hemoglobin: 13.7 g/dL (ref 13.0–17.7)
Immature Grans (Abs): 0.1 10*3/uL (ref 0.0–0.1)
Immature Granulocytes: 1 %
Lymphocytes Absolute: 1.4 10*3/uL (ref 0.7–3.1)
Lymphs: 15 %
MCH: 33.7 pg — ABNORMAL HIGH (ref 26.6–33.0)
MCHC: 34.9 g/dL (ref 31.5–35.7)
MCV: 97 fL (ref 79–97)
Monocytes Absolute: 0.9 10*3/uL (ref 0.1–0.9)
Monocytes: 9 %
Neutrophils Absolute: 7.4 10*3/uL — ABNORMAL HIGH (ref 1.4–7.0)
Neutrophils: 74 %
Platelets: 288 10*3/uL (ref 150–450)
RBC: 4.07 x10E6/uL — ABNORMAL LOW (ref 4.14–5.80)
RDW: 12 % (ref 11.6–15.4)
WBC: 9.8 10*3/uL (ref 3.4–10.8)

## 2019-02-26 LAB — ANEMIA PANEL
Ferritin: 94 ng/mL (ref 30–400)
Folate, Hemolysate: 335 ng/mL
Folate, RBC: 852 ng/mL (ref >498)
Hematocrit: 39.3 % (ref 37.5–51.0)
Iron Saturation: 30 % (ref 15–55)
Iron: 112 ug/dL (ref 38–169)
Retic Ct Pct: 2.5 % (ref 0.6–2.6)
Total Iron Binding Capacity: 377 ug/dL (ref 250–450)
UIBC: 265 ug/dL (ref 111–343)
Vitamin B-12: 1675 pg/mL — ABNORMAL HIGH (ref 232–1245)

## 2019-03-03 NOTE — Chronic Care Management (AMB) (Signed)
°  Chronic Care Management   Outreach Note  03/03/2019 Name: Travis Haynes MRN: [id number] DOB: 1945/12/04  Referred by: Venita Lick, NP Reason for referral : Chronic Care Management (Initial CCM outreach was unsuccessful. ) and Chronic Care Management (Second CCM outreach was unsuccessful)   A second unsuccessful telephone outreach was attempted today. The patient was referred to the case management team for assistance with care management and care coordination.   Follow Up Plan: A HIPPA compliant phone message was left for the patient providing contact information and requesting a return call.  The care management team will reach out to the patient again over the next 7 days.  If patient returns call to provider office, please advise to call [place] [facility] [place] [place] [facility]  [place], [place] [address] Direct Dial: [email or web address]  Website: [email or web address]

## 2019-03-06 ENCOUNTER — Other Ambulatory Visit (HOSPITAL_COMMUNITY)
Admission: RE | Admit: 2019-03-06 | Discharge: 2019-03-06 | Disposition: A | Discharge status: Home / Self Care | Payer: Medicare Other | Source: Ambulatory Visit | Attending: Family Medicine | Admitting: Family Medicine

## 2019-03-06 DIAGNOSIS — Z01812 Encounter for preprocedural laboratory examination: Secondary | ICD-10-CM | POA: Diagnosis not present

## 2019-03-06 DIAGNOSIS — Z20828 Contact with and (suspected) exposure to other viral communicable diseases: Secondary | ICD-10-CM | POA: Diagnosis not present

## 2019-03-06 LAB — SARS CORONAVIRUS 2 (TAT 6-24 HRS): SARS Coronavirus 2: NEGATIVE

## 2019-03-08 ENCOUNTER — Other Ambulatory Visit: Payer: Self-pay

## 2019-03-08 ENCOUNTER — Encounter (HOSPITAL_COMMUNITY): Payer: Self-pay

## 2019-03-08 ENCOUNTER — Ambulatory Visit (HOSPITAL_COMMUNITY): Payer: Medicare Other | Admitting: Physician Assistant

## 2019-03-08 NOTE — Progress Notes (Signed)
Called and spoke with patient (hipaa verified name/dob).  Informed patient of preoperative instructions. He will be NPO after MN, instructed to take amlodipine, and PRN prilosec, tylenol, and tramadol with a sip of water tomorrow morning.  Advised to shower tonight and tomorrow with anti-bacterial, fragrance free soap, like dial soap.  Pt verbalized understanding. Pt is not a diabetic. Pt does not see a cardiologist regularly.  Karoline Caldwell PA-C has reviewed chart, EKG will need to be done tomorrow morning.  Patient is aware to be here at 0530 and states that he knows where Entrance "A" is located as well as the admitting office.  He had no further questions.

## 2019-03-08 NOTE — Pre-Procedure Instructions (Signed)
    Travis Haynes  03/08/2019      [place], [place] [address] [place] [address] Phone: [phone] Fax: [place], [place] [address] [place] [address] Phone: [phone] Fax: [phone]  [id number] - [place], [place] [address] [place] [address] Phone: [phone] Fax: [phone]    Your procedure is scheduled on March 09, 2019.  Report to [facility] Admitting at 530 A.M.  Call this number if you have problems the morning of surgery:  [phone]   Remember:  Do not eat or drink after midnight.    Take these medicines the morning of surgery with A SIP OF WATER  Amlodipie (norvasc) Tylenol-if needed Tramadol-if needed  STOP taking any Aspirin (unless otherwise instructed by your surgeon), Aleve, Naproxen, Ibuprofen, Motrin, Advil, Goody's, BC's, all herbal medications, fish oil, and all vitamins  Day of surgery   Do not wear jewelry  Do not wear lotions, powders, or colognes, or deodorant.  Men may shave face and neck.  Do not bring valuables to the hospital.  [facility] is not responsible for any belongings or valuables.  Contacts, dentures or bridgework may not be worn into surgery.  Leave your suitcase in the car.  After surgery it may be brought to your room.  For patients admitted to the hospital, discharge time will be determined by your treatment team.  Patients discharged the day of surgery will not be allowed to drive home.   Wash with anti-bacterial, fragrance free soap (dial)  PHONE CALL

## 2019-03-08 NOTE — Anesthesia Preprocedure Evaluation (Deleted)
Anesthesia Evaluation  Patient identified by MRN, date of birth, ID band Patient awake    Reviewed: Allergy & Precautions, NPO status , Patient's Chart, lab work & pertinent test results  History of Anesthesia Complications Negative for: history of anesthetic complications  Airway Mallampati: II  TM Distance: >3 FB Neck ROM: Full    Dental  (+) Edentulous Upper, Edentulous Lower   Pulmonary neg pulmonary ROS, former smoker,    Pulmonary exam normal        Cardiovascular hypertension, Pt. on medications + CAD  Normal cardiovascular exam     Neuro/Psych negative neurological ROS  negative psych ROS   GI/Hepatic Neg liver ROS, hiatal hernia, GERD  ,  Endo/Other  negative endocrine ROS  Renal/GU negative Renal ROS  negative genitourinary   Musculoskeletal  (+) Arthritis ,   Abdominal   Peds  Hematology negative hematology ROS (+)   Anesthesia Other Findings   Reproductive/Obstetrics                           Anesthesia Physical Anesthesia Plan  ASA: II  Anesthesia Plan: General   Post-op Pain Management:    Induction: Intravenous  PONV Risk Score and Plan: 2 and Ondansetron, Dexamethasone, Treatment may vary due to age or medical condition and Midazolam  Airway Management Planned: LMA  Additional Equipment: None  Intra-op Plan:   Post-operative Plan: Extubation in OR  Informed Consent: I have reviewed the patients History and Physical, chart, labs and discussed the procedure including the risks, benefits and alternatives for the proposed anesthesia with the patient or authorized representative who has indicated his/her understanding and acceptance.     Dental advisory given  Plan Discussed with:   Anesthesia Plan Comments: (CAD listed in patient history. Review of records in care everywhere shows he was seen by cardiology in 2015 for eval of coronary calcifications seen on  CT. He underwent nuclear stress which was nonischemic, low risk, and echo which showed EF 50-55%, mild-mod MR and TR, no valvular stenosis. Risk factor modification recommended.   Follows with rheumatology at [facility] for hx of RA.   Nuclear stress 11/23/13 (care everywhere): FINDINGS:  Regional wall motion: reveals normal myocardial thickening and wall   motion.  The overall quality of the study is good.   Artifacts noted: no  Left ventricular cavity: normal.    Perfusion Analysis: SPECT images demonstrate homogeneous tracer   distribution throughout the myocardium.   Impressions: Normal myocardial perfusion study no evidence stress-induced   myocardial ischemia ejection fraction of 52%    TTE 11/23/13 (care everywhere): NORMAL LEFT VENTRICULAR SYSTOLIC FUNCTION WITH AN ESTIMATED EF = 50-55 % NORMAL RIGHT VENTRICULAR SYSTOLIC FUNCTION MILD-TO-MODERATE TRICUSPID AND MITRAL VALVE INSUFFICIENCY NO VALVULAR STENOSIS )      Anesthesia Quick Evaluation

## 2019-03-08 NOTE — Progress Notes (Signed)
Anesthesia Chart Review: Same day workup  CAD listed in patient history. Review of records in care everywhere shows he was seen by cardiology in 2015 for eval of coronary calcifications seen on CT. He underwent nuclear stress which was nonischemic, low risk, and echo which showed EF 50-55%, mild-mod MR and TR, no valvular stenosis. Risk factor modification recommended.   Follows with rheumatology at [facility] for hx of RA.   Nuclear stress 11/23/13 (care everywhere): FINDINGS:  Regional wall motion: reveals normal myocardial thickening and wall   motion.  The overall quality of the study is good.   Artifacts noted: no  Left ventricular cavity: normal.    Perfusion Analysis: SPECT images demonstrate homogeneous tracer   distribution throughout the myocardium.   Impressions: Normal myocardial perfusion study no evidence stress-induced   myocardial ischemia ejection fraction of 52%    TTE 11/23/13 (care everywhere): NORMAL LEFT VENTRICULAR SYSTOLIC FUNCTION WITH AN ESTIMATED EF = 50-55 % NORMAL RIGHT VENTRICULAR SYSTOLIC FUNCTION MILD-TO-MODERATE TRICUSPID AND MITRAL VALVE INSUFFICIENCY NO VALVULAR STENOSIS   Wynonia Musty [facility]/Anesthesiology Phone [phone] 03/08/2019 1:14 PM

## 2019-03-09 ENCOUNTER — Ambulatory Visit (HOSPITAL_COMMUNITY): Admission: RE | Admit: 2019-03-09 | Payer: Medicare Other | Source: Ambulatory Visit

## 2019-03-09 ENCOUNTER — Encounter (HOSPITAL_COMMUNITY)
Admission: RE | Disposition: A | Discharge status: Home / Self Care | Payer: Self-pay | Source: Home / Self Care | Attending: Family Medicine

## 2019-03-09 ENCOUNTER — Encounter (HOSPITAL_COMMUNITY): Payer: Self-pay

## 2019-03-09 ENCOUNTER — Ambulatory Visit (HOSPITAL_COMMUNITY)
Admission: RE | Admit: 2019-03-09 | Discharge: 2019-03-09 | Disposition: A | Discharge status: Home / Self Care | Payer: Medicare Other | Attending: Family Medicine | Admitting: Family Medicine

## 2019-03-09 ENCOUNTER — Other Ambulatory Visit: Payer: Self-pay

## 2019-03-09 DIAGNOSIS — Z539 Procedure and treatment not carried out, unspecified reason: Secondary | ICD-10-CM | POA: Insufficient documentation

## 2019-03-09 DIAGNOSIS — I2510 Atherosclerotic heart disease of native coronary artery without angina pectoris: Secondary | ICD-10-CM | POA: Insufficient documentation

## 2019-03-09 DIAGNOSIS — M5136 Other intervertebral disc degeneration, lumbar region: Secondary | ICD-10-CM | POA: Diagnosis not present

## 2019-03-09 HISTORY — PX: RADIOLOGY WITH ANESTHESIA: SHX6223

## 2019-03-09 SURGERY — MRI WITH ANESTHESIA
Anesthesia: General

## 2019-03-09 MED ORDER — LACTATED RINGERS IV SOLN: INTRAVENOUS | Status: DC

## 2019-03-09 NOTE — Progress Notes (Signed)
No labs needed per Dr. Christella Hartigan.

## 2019-03-09 NOTE — Progress Notes (Signed)
IV d/c'd. Pressure applied. No complications.

## 2019-03-09 NOTE — Progress Notes (Signed)
Dr. Christella Hartigan notified of case being cancelled.

## 2019-03-09 NOTE — Progress Notes (Signed)
Patient's MRI cancelled today due to no history and physical within 30 days.  Nurse notified Anderson Malta at Dr. Sharlet Salina office and informed Anderson Malta that MRI with anesthesia would need to be rescheduled and patient would need to be seen in the office for an H&P after MRI was scheduled, to ensure that H&P is within 30 days.  Anderson Malta verbalized understanding and stated she would get MRI rescheduled and then schedule patient for an in-office appointment.     Patient is aware of plan and voiced understanding.

## 2019-03-10 ENCOUNTER — Encounter: Payer: Self-pay | Admitting: *Deleted

## 2019-03-10 ENCOUNTER — Telehealth: Payer: Self-pay | Admitting: Nurse Practitioner

## 2019-03-10 NOTE — Chronic Care Management (AMB) (Signed)
  Chronic Care Management   Note  03/10/2019 Name: Travis Haynes MRN: [id number] DOB: 15-Feb-1946  Travis Haynes is a 73 y.o. year old male who is a primary care patient of Cannady, Barbaraann Faster, NP. I reached out to Harolyn Rutherford by phone today in response to a referral sent by Travis Haynes [facility] health plan.     [name] was given information about Chronic Care Management services today including:  1. CCM service includes personalized support from designated clinical staff supervised by his physician, including individualized plan of care and coordination with other care providers 2. 24/7 contact phone numbers for assistance for urgent and routine care needs. 3. Service will only be billed when office clinical staff spend 20 minutes or more in a month to coordinate care. 4. Only one practitioner may furnish and bill the service in a calendar month. 5. The patient may stop CCM services at any time (effective at the end of the month) by phone call to the office staff. 6. The patient will be responsible for cost sharing (co-pay) of up to 20% of the service fee (after annual deductible is met).  Patient did not agree to enrollment in care management services and does not wish to consider at this time.  Follow up plan: The patient has been provided with contact information for the chronic care management team and has been advised to call with any health related questions or concerns.   [place], [place] [phone] Direct Dial: [email or web address]  Website: [email or web address]

## 2019-03-10 NOTE — Telephone Encounter (Signed)
Copied from [place] [phone]. Topic: Appointment Scheduling - Scheduling Inquiry for Clinic >> Mar 10, 2019  1:59 PM Scherrie Gerlach wrote: Reason for CRM: pt states he was to follow up if his feet swelling did not get better.  Pt states his feet are still swollen and he is requesting appt to see Edder Bellanca asap.   No appt s available.  Pt does not want to see anyone else. >> Mar 10, 2019  2:12 PM Don Perking M wrote: Can he be worked in anywhere?

## 2019-03-11 ENCOUNTER — Encounter: Payer: Self-pay | Admitting: Family Medicine

## 2019-03-11 ENCOUNTER — Ambulatory Visit (INDEPENDENT_AMBULATORY_CARE_PROVIDER_SITE_OTHER): Payer: Medicare Other | Admitting: Family Medicine

## 2019-03-11 ENCOUNTER — Other Ambulatory Visit: Payer: Self-pay

## 2019-03-11 VITALS — BP 159/89 | HR 123 | Temp 97.7°F | Ht 68.0 in | Wt 150.0 lb

## 2019-03-11 DIAGNOSIS — R609 Edema, unspecified: Secondary | ICD-10-CM

## 2019-03-11 MED ORDER — FUROSEMIDE 40 MG PO TABS: 40.0000 mg | ORAL_TABLET | Freq: Every day | ORAL | 3 refills | Status: DC

## 2019-03-11 NOTE — Progress Notes (Signed)
BP (!) 159/89 (BP Location: Left Arm, Patient Position: Sitting, Cuff Size: Normal)   Pulse (!) 123   Temp 97.7 F (36.5 C) (Oral)   Ht _0  (1.727 m)   Wt 150 lb (68 kg)   SpO2 96%   BMI 22.81 kg/m    Subjective:    Patient ID: Travis Haynes, male    DOB: [birth year], 73 y.o.   MRN: [id number]  HPI: Travis Haynes is a 73 y.o. male  Chief Complaint  Patient presents with  . Edema    Swollen Feet   Feet has been swelling since the middle of November. Has been soaking them in epson salts, that's it. HCTZ is not helping  Relevant past medical, surgical, family and social history reviewed and updated as indicated. Interim medical history since our last visit reviewed. Allergies and medications reviewed and updated.  Review of Systems  Constitutional: Negative.   HENT: Negative.   Respiratory: Negative for apnea, cough, choking, chest tightness, shortness of breath, wheezing and stridor.   Cardiovascular: Positive for leg swelling. Negative for chest pain and palpitations.  Musculoskeletal: Positive for back pain and myalgias. Negative for arthralgias, gait problem, joint swelling, neck pain and neck stiffness.  Skin: Negative.   Psychiatric/Behavioral: Negative.     Per HPI unless specifically indicated above     Objective:    BP (!) 159/89 (BP Location: Left Arm, Patient Position: Sitting, Cuff Size: Normal)   Pulse (!) 123   Temp 97.7 F (36.5 C) (Oral)   Ht _1  (1.727 m)   Wt 150 lb (68 kg)   SpO2 96%   BMI 22.81 kg/m   Wt Readings from Last 3 Encounters:  03/11/19 150 lb (68 kg)  03/09/19 151 lb (68.5 kg)  12/31/18 149 lb 14.6 oz (68 kg)    Physical Exam Vitals and nursing note reviewed.  Constitutional:      General: He is not in acute distress.    Appearance: Normal appearance. He is not ill-appearing, toxic-appearing or diaphoretic.  HENT:     Head: Normocephalic and atraumatic.     Right Ear: External ear normal.     Left Ear: External ear  normal.     Nose: Nose normal.     Mouth/Throat:     Mouth: Mucous membranes are moist.     Pharynx: Oropharynx is clear.  Eyes:     General: No scleral icterus.       Right eye: No discharge.        Left eye: No discharge.     Extraocular Movements: Extraocular movements intact.     Conjunctiva/sclera: Conjunctivae normal.     Pupils: Pupils are equal, round, and reactive to light.  Cardiovascular:     Rate and Rhythm: Normal rate and regular rhythm.     Pulses: Normal pulses.     Heart sounds: Normal heart sounds. No murmur. No friction rub. No gallop.   Pulmonary:     Effort: Pulmonary effort is normal. No respiratory distress.     Breath sounds: Normal breath sounds. No stridor. No wheezing, rhonchi or rales.  Chest:     Chest wall: No tenderness.  Musculoskeletal:        General: Normal range of motion.     Cervical back: Normal range of motion and neck supple.     Right lower leg: Edema (3+ edema) present.     Left lower leg: Edema (3+ edema) present.  Skin:  General: Skin is warm and dry.     Capillary Refill: Capillary refill takes less than 2 seconds.     Coloration: Skin is not jaundiced or pale.     Findings: No bruising, erythema, lesion or rash.  Neurological:     General: No focal deficit present.     Mental Status: He is alert and oriented to person, place, and time. Mental status is at baseline.  Psychiatric:        Mood and Affect: Mood normal.        Behavior: Behavior normal.        Thought Content: Thought content normal.        Judgment: Judgment normal.     Results for orders placed or performed during the hospital encounter of 03/06/19  SARS CORONAVIRUS 2 (TAT 6-24 HRS) Nasopharyngeal Nasopharyngeal Swab   Specimen: Nasopharyngeal Swab  Result Value Ref Range   SARS Coronavirus 2 NEGATIVE NEGATIVE      Assessment & Plan:   Problem List Items Addressed This Visit    None    Visit Diagnoses    Peripheral edema    -  Primary   Will check  labs and start lasix and compression hose. Recheck 2-3 weeks. Call with any concerns. Await results.    Relevant Orders   CBC with Differential OUT   Comp Met (CMET)   B Nat Peptide   TSH       Follow up plan: Return 2-3 weeks.

## 2019-03-12 LAB — COMPREHENSIVE METABOLIC PANEL
ALT: 15 IU/L (ref 0–44)
AST: 24 IU/L (ref 0–40)
Albumin/Globulin Ratio: 1.8 (ref 1.2–2.2)
Albumin: 4.2 g/dL (ref 3.7–4.7)
Alkaline Phosphatase: 77 IU/L (ref 39–117)
BUN/Creatinine Ratio: 13 (ref 10–24)
BUN: 16 mg/dL (ref 8–27)
Bilirubin Total: 0.8 mg/dL (ref 0.0–1.2)
CO2: 28 mmol/L (ref 20–29)
Calcium: 9.9 mg/dL (ref 8.6–10.2)
Chloride: 93 mmol/L — ABNORMAL LOW (ref 96–106)
Creatinine, Ser: 1.2 mg/dL (ref 0.76–1.27)
GFR calc Af Amer: 69 mL/min/{1.73_m2} (ref >59)
GFR calc non Af Amer: 60 mL/min/{1.73_m2} (ref >59)
Globulin, Total: 2.4 g/dL (ref 1.5–4.5)
Glucose: 85 mg/dL (ref 65–99)
Potassium: 3.3 mmol/L — ABNORMAL LOW (ref 3.5–5.2)
Sodium: 137 mmol/L (ref 134–144)
Total Protein: 6.6 g/dL (ref 6.0–8.5)

## 2019-03-12 LAB — CBC WITH DIFFERENTIAL/PLATELET
Basophils Absolute: 0.1 10*3/uL (ref 0.0–0.2)
Basos: 1 %
EOS (ABSOLUTE): 0.1 10*3/uL (ref 0.0–0.4)
Eos: 1 %
Hematocrit: 40.6 % (ref 37.5–51.0)
Hemoglobin: 14.4 g/dL (ref 13.0–17.7)
Immature Grans (Abs): 0.1 10*3/uL (ref 0.0–0.1)
Immature Granulocytes: 1 %
Lymphocytes Absolute: 1.9 10*3/uL (ref 0.7–3.1)
Lymphs: 19 %
MCH: 35 pg — ABNORMAL HIGH (ref 26.6–33.0)
MCHC: 35.5 g/dL (ref 31.5–35.7)
MCV: 99 fL — ABNORMAL HIGH (ref 79–97)
Monocytes Absolute: 1.4 10*3/uL — ABNORMAL HIGH (ref 0.1–0.9)
Monocytes: 14 %
Neutrophils Absolute: 6.3 10*3/uL (ref 1.4–7.0)
Neutrophils: 64 %
Platelets: 296 10*3/uL (ref 150–450)
RBC: 4.12 x10E6/uL — ABNORMAL LOW (ref 4.14–5.80)
RDW: 12.4 % (ref 11.6–15.4)
WBC: 10 10*3/uL (ref 3.4–10.8)

## 2019-03-12 LAB — BRAIN NATRIURETIC PEPTIDE: BNP: 33.2 pg/mL (ref 0.0–100.0)

## 2019-03-12 LAB — TSH: TSH: 3.6 u[IU]/mL (ref 0.450–4.500)

## 2019-03-14 ENCOUNTER — Encounter: Payer: Self-pay | Admitting: Family Medicine

## 2019-03-22 ENCOUNTER — Telehealth: Payer: Self-pay | Admitting: Nurse Practitioner

## 2019-03-22 NOTE — Telephone Encounter (Signed)
Please let him know that Dr. Wynetta Emery did send out a letter with results, which he should receive soon.  I would like to see him sooner if ongoing pain, highly recommend he reschedule and be seen sooner if ongoing symptoms please.  Would be face to face in office.  We also will need to recheck labs with starting Lasix.

## 2019-03-22 NOTE — Telephone Encounter (Signed)
Called pt to r/s 04/01/19 appt with wicker. He states that he has not received a call back with his results. Also states that he is still hurting. Offered sooner appt he doesn't want to come in early and said he'll call back to schedule.

## 2019-03-22 NOTE — Telephone Encounter (Signed)
Called pt, no answer, left vm to call back to discuss jolene's message

## 2019-03-29 ENCOUNTER — Other Ambulatory Visit: Payer: Self-pay | Admitting: Nurse Practitioner

## 2019-04-01 ENCOUNTER — Ambulatory Visit: Payer: Medicare Other | Admitting: Unknown Physician Specialty

## 2019-04-01 ENCOUNTER — Ambulatory Visit: Payer: Medicare Other | Admitting: Nurse Practitioner

## 2019-04-02 ENCOUNTER — Ambulatory Visit: Payer: Medicare Other | Admitting: Nurse Practitioner

## 2019-04-05 ENCOUNTER — Other Ambulatory Visit: Payer: Self-pay

## 2019-04-05 ENCOUNTER — Encounter: Payer: Self-pay | Admitting: Nurse Practitioner

## 2019-04-05 ENCOUNTER — Ambulatory Visit (INDEPENDENT_AMBULATORY_CARE_PROVIDER_SITE_OTHER): Payer: Medicare Other | Admitting: Nurse Practitioner

## 2019-04-05 VITALS — BP 128/78 | HR 100 | Temp 97.6°F | Wt 154.0 lb

## 2019-04-05 DIAGNOSIS — R600 Localized edema: Secondary | ICD-10-CM | POA: Diagnosis not present

## 2019-04-05 DIAGNOSIS — Z8572 Personal history of non-Hodgkin lymphomas: Secondary | ICD-10-CM | POA: Diagnosis not present

## 2019-04-05 DIAGNOSIS — D509 Iron deficiency anemia, unspecified: Secondary | ICD-10-CM | POA: Diagnosis not present

## 2019-04-05 DIAGNOSIS — D510 Vitamin B12 deficiency anemia due to intrinsic factor deficiency: Secondary | ICD-10-CM | POA: Diagnosis not present

## 2019-04-05 MED ORDER — MUPIROCIN 2 % EX OINT: 1.0000 "application " | TOPICAL_OINTMENT | Freq: Two times a day (BID) | CUTANEOUS | 0 refills | Status: DC

## 2019-04-05 NOTE — Assessment & Plan Note (Signed)
Ongoing issue with minimal improvement from previous appointment.  Will continue Lasix at this time.  Obtain labs today.  Order for echo to assess heart function, did have normal BNP last visit.  Referral to vascular for further assessment and recommendations, suspect some lymphedema; however patient with difficulty placing compression hose due to RA.  May benefit from pump for compression.

## 2019-04-05 NOTE — Progress Notes (Signed)
BP 128/78 (BP Location: Left Arm, Patient Position: Sitting)   Pulse 100   Temp 97.6 F (36.4 C) (Oral)   Wt 154 lb (69.9 kg)   SpO2 98%   BMI 23.42 kg/m    Subjective:    Patient ID: Travis Haynes, male    DOB: [birth year], 74 y.o.   MRN: [id number]  HPI: Travis Haynes is a 74 y.o. male  Chief Complaint  Patient presents with  . Leg Swelling  . Foot Swelling   EDEMA BILATERAL LEGS: Was seen on 03/11/2019 for similar presentation and HCTZ discontinued with Lasix 40 MG being started.  He was recommended to start compression hose, but has not done this due to being too hard to put on with his RA.  He feels that edema is getting worse.  Denies SOB, CP, palpitations.  BNP last visit 33.2 and CRT 1.20 + GFR 60.  Does endorse enjoying salt, states he has cut back some.  Is urinating well without issue.  Has a history of non-Hodgkin's lymphoma treated with chemo in 1998.  Denis pain to lower extremities, other than his arthritis pain which is consistent and unchanged. Paresthesias:   no Decreased sensation:  no Weakness:   no Insomnia:   no Fatigue:   no Alleviating factors: placing legs up Aggravating factors: being up and moving Status: fluctuating  Relevant past medical, surgical, family and social history reviewed and updated as indicated. Interim medical history since our last visit reviewed. Allergies and medications reviewed and updated.  Review of Systems  Constitutional: Negative for activity change, diaphoresis, fatigue and fever.  Respiratory: Negative for cough, chest tightness, shortness of breath and wheezing.   Cardiovascular: Positive for leg swelling. Negative for chest pain and palpitations.  Psychiatric/Behavioral: Negative.     Per HPI unless specifically indicated above     Objective:    BP 128/78 (BP Location: Left Arm, Patient Position: Sitting)   Pulse 100   Temp 97.6 F (36.4 C) (Oral)   Wt 154 lb (69.9 kg)   SpO2 98%   BMI 23.42 kg/m   Wt  Readings from Last 3 Encounters:  04/05/19 154 lb (69.9 kg)  03/11/19 150 lb (68 kg)  03/09/19 151 lb (68.5 kg)    Physical Exam Vitals and nursing note reviewed.  Constitutional:      General: He is awake. He is not in acute distress.    Appearance: He is well-developed. He is not ill-appearing.  HENT:     Head: Normocephalic and atraumatic.     Right Ear: Hearing normal. No drainage.     Left Ear: Hearing normal. No drainage.  Eyes:     General: Lids are normal.        Right eye: No discharge.        Left eye: No discharge.     Conjunctiva/sclera: Conjunctivae normal.     Pupils: Pupils are equal, round, and reactive to light.  Neck:     Thyroid: No thyromegaly.     Vascular: No carotid bruit or JVD.  Cardiovascular:     Rate and Rhythm: Normal rate and regular rhythm.     Heart sounds: Normal heart sounds, S1 normal and S2 normal. No murmur. No gallop.      Comments: 3 + pitting edema bilateral lower extremity.  Right side from foot to just below knee and left side from foot to mid shin.  No warmth or tenderness.  Negative Homans. Pulmonary:  Effort: Pulmonary effort is normal. No accessory muscle usage or respiratory distress.     Breath sounds: Normal breath sounds.  Abdominal:     General: Bowel sounds are normal.     Palpations: Abdomen is soft. There is no hepatomegaly or splenomegaly.  Musculoskeletal:        General: Normal range of motion.     Cervical back: Normal range of motion and neck supple.     Right lower leg: 3+ Edema present.     Left lower leg: 3+ Pitting Edema present.  Skin:    General: Skin is warm and dry.     Capillary Refill: Capillary refill takes less than 2 seconds.     Comments: X 4 scattered old, open blisters to right foot ankle with scant serous drainage.  Mild erythema and intact blister to pedal aspect lower great toe.  No warmth or tenderness.  Neurological:     Mental Status: He is alert and oriented to person, place, and time.    Psychiatric:        Mood and Affect: Mood normal.        Behavior: Behavior normal. Behavior is cooperative.        Thought Content: Thought content normal.        Judgment: Judgment normal.    Results for orders placed or performed in visit on 03/11/19  CBC with Differential OUT  Result Value Ref Range   WBC 10.0 3.4 - 10.8 x10E3/uL   RBC 4.12 (L) 4.14 - 5.80 x10E6/uL   Hemoglobin 14.4 13.0 - 17.7 g/dL   Hematocrit 40.6 37.5 - 51.0 %   MCV 99 (H) 79 - 97 fL   MCH 35.0 (H) 26.6 - 33.0 pg   MCHC 35.5 31.5 - 35.7 g/dL   RDW 12.4 11.6 - 15.4 %   Platelets 296 150 - 450 x10E3/uL   Neutrophils 64 Not Estab. %   Lymphs 19 Not Estab. %   Monocytes 14 Not Estab. %   Eos 1 Not Estab. %   Basos 1 Not Estab. %   Neutrophils Absolute 6.3 1.4 - 7.0 x10E3/uL   Lymphocytes Absolute 1.9 0.7 - 3.1 x10E3/uL   Monocytes Absolute 1.4 (H) 0.1 - 0.9 x10E3/uL   EOS (ABSOLUTE) 0.1 0.0 - 0.4 x10E3/uL   Basophils Absolute 0.1 0.0 - 0.2 x10E3/uL   Immature Granulocytes 1 Not Estab. %   Immature Grans (Abs) 0.1 0.0 - 0.1 x10E3/uL  Comp Met (CMET)  Result Value Ref Range   Glucose 85 65 - 99 mg/dL   BUN 16 8 - 27 mg/dL   Creatinine, Ser 1.20 0.76 - 1.27 mg/dL   GFR calc non Af Amer 60 >59 mL/min/1.73   GFR calc Af Amer 69 >59 mL/min/1.73   BUN/Creatinine Ratio 13 10 - 24   Sodium 137 134 - 144 mmol/L   Potassium 3.3 (L) 3.5 - 5.2 mmol/L   Chloride 93 (L) 96 - 106 mmol/L   CO2 28 20 - 29 mmol/L   Calcium 9.9 8.6 - 10.2 mg/dL   Total Protein 6.6 6.0 - 8.5 g/dL   Albumin 4.2 3.7 - 4.7 g/dL   Globulin, Total 2.4 1.5 - 4.5 g/dL   Albumin/Globulin Ratio 1.8 1.2 - 2.2   Bilirubin Total 0.8 0.0 - 1.2 mg/dL   Alkaline Phosphatase 77 39 - 117 IU/L   AST 24 0 - 40 IU/L   ALT 15 0 - 44 IU/L  B Nat Peptide  Result Value Ref Range  BNP 33.2 0.0 - 100.0 pg/mL  TSH  Result Value Ref Range   TSH 3.600 0.450 - 4.500 uIU/mL      Assessment & Plan:   Problem List Items Addressed This Visit       Other   History of non-Hodgkin's lymphoma    Will check labs today and send to vascular to assess edema BLE.        Bilateral leg edema - Primary    Ongoing issue with minimal improvement from previous appointment.  Will continue Lasix at this time.  Obtain labs today.  Order for echo to assess heart function, did have normal BNP last visit.  Referral to vascular for further assessment and recommendations, suspect some lymphedema; however patient with difficulty placing compression hose due to RA.  May benefit from pump for compression.        Relevant Orders   CBC with Differential/Platelet out   Comprehensive metabolic panel   Anemia panel   Ambulatory referral to Vascular Surgery   ECHOCARDIOGRAM COMPLETE      Follow up plan: Return in about 1 week (around 04/12/2019) for Edema.

## 2019-04-05 NOTE — Assessment & Plan Note (Signed)
Will check labs today and send to vascular to assess edema BLE.

## 2019-04-05 NOTE — Patient Instructions (Signed)
Edema  Edema is when you have too much fluid in your body or under your skin. Edema may make your legs, feet, and ankles swell up. Swelling is also common in looser tissues, like around your eyes. This is a common condition. It gets more common as you get older. There are many possible causes of edema. Eating too much salt (sodium) and being on your feet or sitting for a long time can cause edema in your legs, feet, and ankles. Hot weather may make edema worse. Edema is usually painless. Your skin may look swollen or shiny. Follow these instructions at home:  Keep the swollen body part raised (elevated) above the level of your heart when you are sitting or lying down.  Do not sit still or stand for a long time.  Do not wear tight clothes. Do not wear garters on your upper legs.  Exercise your legs. This can help the swelling go down.  Wear elastic bandages or support stockings as told by your doctor.  Eat a low-salt (low-sodium) diet to reduce fluid as told by your doctor.  Depending on the cause of your swelling, you may need to limit how much fluid you drink (fluid restriction).  Take over-the-counter and prescription medicines only as told by your doctor. Contact a doctor if:  Treatment is not working.  You have heart, liver, or kidney disease and have symptoms of edema.  You have sudden and unexplained weight gain. Get help right away if:  You have shortness of breath or chest pain.  You cannot breathe when you lie down.  You have pain, redness, or warmth in the swollen areas.  You have heart, liver, or kidney disease and get edema all of a sudden.  You have a fever and your symptoms get worse all of a sudden. Summary  Edema is when you have too much fluid in your body or under your skin.  Edema may make your legs, feet, and ankles swell up. Swelling is also common in looser tissues, like around your eyes.  Raise (elevate) the swollen body part above the level of your  heart when you are sitting or lying down.  Follow your doctor's instructions about diet and how much fluid you can drink (fluid restriction). This information is not intended to replace advice given to you by your health care provider. Make sure you discuss any questions you have with your health care provider. Document Revised: 03/14/2017 Document Reviewed: 03/29/2016 Elsevier Patient Education  2020 [employer] Inc[employer]  

## 2019-04-06 NOTE — Progress Notes (Signed)
Please let Travis Haynes know I am waiting on a couple more labs to return, but overall at this time labs are remaining at baseline.  No kidney disease or liver lab abnormalities.  Iron and B12 levels are good.  Please ensure you let us know if vascular does not contact you in upcoming days so we can follow-up on referral.  Elevate legs frequently and decrease sodium.

## 2019-04-07 LAB — ANEMIA PANEL
Ferritin: 85 ng/mL (ref 30–400)
Folate, Hemolysate: 362 ng/mL
Folate, RBC: 883 ng/mL (ref >498)
Hematocrit: 41 % (ref 37.5–51.0)
Iron Saturation: 17 % (ref 15–55)
Iron: 63 ug/dL (ref 38–169)
Retic Ct Pct: 2 % (ref 0.6–2.6)
Total Iron Binding Capacity: 380 ug/dL (ref 250–450)
UIBC: 317 ug/dL (ref 111–343)
Vitamin B-12: 913 pg/mL (ref 232–1245)

## 2019-04-07 LAB — CBC WITH DIFFERENTIAL/PLATELET
Basophils Absolute: 0.1 10*3/uL (ref 0.0–0.2)
Basos: 1 %
EOS (ABSOLUTE): 0 10*3/uL (ref 0.0–0.4)
Eos: 0 %
Hemoglobin: 13.4 g/dL (ref 13.0–17.7)
Immature Grans (Abs): 0 10*3/uL (ref 0.0–0.1)
Immature Granulocytes: 1 %
Lymphocytes Absolute: 1.1 10*3/uL (ref 0.7–3.1)
Lymphs: 13 %
MCH: 32.4 pg (ref 26.6–33.0)
MCHC: 32.7 g/dL (ref 31.5–35.7)
MCV: 99 fL — ABNORMAL HIGH (ref 79–97)
Monocytes Absolute: 0.8 10*3/uL (ref 0.1–0.9)
Monocytes: 10 %
Neutrophils Absolute: 6.6 10*3/uL (ref 1.4–7.0)
Neutrophils: 75 %
Platelets: 238 10*3/uL (ref 150–450)
RBC: 4.13 x10E6/uL — ABNORMAL LOW (ref 4.14–5.80)
RDW: 12.8 % (ref 11.6–15.4)
WBC: 8.6 10*3/uL (ref 3.4–10.8)

## 2019-04-07 LAB — COMPREHENSIVE METABOLIC PANEL
ALT: 13 IU/L (ref 0–44)
AST: 24 IU/L (ref 0–40)
Albumin/Globulin Ratio: 1.9 (ref 1.2–2.2)
Albumin: 4.3 g/dL (ref 3.7–4.7)
Alkaline Phosphatase: 83 IU/L (ref 39–117)
BUN/Creatinine Ratio: 17 (ref 10–24)
BUN: 19 mg/dL (ref 8–27)
Bilirubin Total: 0.4 mg/dL (ref 0.0–1.2)
CO2: 29 mmol/L (ref 20–29)
Calcium: 9.4 mg/dL (ref 8.6–10.2)
Chloride: 97 mmol/L (ref 96–106)
Creatinine, Ser: 1.1 mg/dL (ref 0.76–1.27)
GFR calc Af Amer: 77 mL/min/{1.73_m2} (ref >59)
GFR calc non Af Amer: 67 mL/min/{1.73_m2} (ref >59)
Globulin, Total: 2.3 g/dL (ref 1.5–4.5)
Glucose: 114 mg/dL — ABNORMAL HIGH (ref 65–99)
Potassium: 3.7 mmol/L (ref 3.5–5.2)
Sodium: 140 mmol/L (ref 134–144)
Total Protein: 6.6 g/dL (ref 6.0–8.5)

## 2019-04-09 DIAGNOSIS — M5416 Radiculopathy, lumbar region: Secondary | ICD-10-CM | POA: Diagnosis not present

## 2019-04-09 DIAGNOSIS — M5136 Other intervertebral disc degeneration, lumbar region: Secondary | ICD-10-CM | POA: Diagnosis not present

## 2019-04-12 ENCOUNTER — Ambulatory Visit: Payer: Medicare Other | Admitting: Nurse Practitioner

## 2019-04-15 ENCOUNTER — Encounter: Payer: Self-pay | Admitting: Nurse Practitioner

## 2019-04-15 ENCOUNTER — Ambulatory Visit (INDEPENDENT_AMBULATORY_CARE_PROVIDER_SITE_OTHER): Payer: Medicare Other | Admitting: Nurse Practitioner

## 2019-04-15 DIAGNOSIS — R600 Localized edema: Secondary | ICD-10-CM

## 2019-04-15 NOTE — Assessment & Plan Note (Addendum)
Ongoing issue with some improvement reported from previous appointment.  Will continue Lasix at this time.  Awaiting echo to assess heart function, did have normal BNP last visit.  Is scheduled to see vascular next week and will await their recommendations, suspect some lymphedema; however patient with difficulty placing compression hose due to RA.  May benefit from pump for compression.  Follow-up after vascular appointment, sooner if worsening symptoms.

## 2019-04-15 NOTE — Patient Instructions (Signed)
Edema  Edema is when you have too much fluid in your body or under your skin. Edema may make your legs, feet, and ankles swell up. Swelling is also common in looser tissues, like around your eyes. This is a common condition. It gets more common as you get older. There are many possible causes of edema. Eating too much salt (sodium) and being on your feet or sitting for a long time can cause edema in your legs, feet, and ankles. Hot weather may make edema worse. Edema is usually painless. Your skin may look swollen or shiny. Follow these instructions at home:  Keep the swollen body part raised (elevated) above the level of your heart when you are sitting or lying down.  Do not sit still or stand for a long time.  Do not wear tight clothes. Do not wear garters on your upper legs.  Exercise your legs. This can help the swelling go down.  Wear elastic bandages or support stockings as told by your doctor.  Eat a low-salt (low-sodium) diet to reduce fluid as told by your doctor.  Depending on the cause of your swelling, you may need to limit how much fluid you drink (fluid restriction).  Take over-the-counter and prescription medicines only as told by your doctor. Contact a doctor if:  Treatment is not working.  You have heart, liver, or kidney disease and have symptoms of edema.  You have sudden and unexplained weight gain. Get help right away if:  You have shortness of breath or chest pain.  You cannot breathe when you lie down.  You have pain, redness, or warmth in the swollen areas.  You have heart, liver, or kidney disease and get edema all of a sudden.  You have a fever and your symptoms get worse all of a sudden. Summary  Edema is when you have too much fluid in your body or under your skin.  Edema may make your legs, feet, and ankles swell up. Swelling is also common in looser tissues, like around your eyes.  Raise (elevate) the swollen body part above the level of your  heart when you are sitting or lying down.  Follow your doctor's instructions about diet and how much fluid you can drink (fluid restriction). This information is not intended to replace advice given to you by your health care provider. Make sure you discuss any questions you have with your health care provider. Document Revised: 03/14/2017 Document Reviewed: 03/29/2016 Elsevier Patient Education  2020 [employer] Inc[employer]  

## 2019-04-15 NOTE — Progress Notes (Signed)
BP 120/78   Pulse 95    Subjective:    Patient ID: Travis Haynes, male    DOB: June 08, 1945, 74 y.o.   MRN: [id number]  HPI: Travis Haynes is a 74 y.o. male  Chief Complaint  Patient presents with  . Edema    1 week f/up, patient states the swelling is a little bit better.    . This visit was completed via telephone due to the restrictions of the COVID-19 pandemic. All issues as above were discussed and addressed but no physical exam was performed. If it was felt that the patient should be evaluated in the office, they were directed there. The patient verbally consented to this visit. Patient was unable to complete an audio/visual visit due to Lack of equipment. Due to the catastrophic nature of the COVID-19 pandemic, this visit was done through audio contact only. . Location of the patient: home . Location of the provider: home . Those involved with this call:  . Provider: Marnee Guarneri, DNP . CMA: Yvonna Alanis, CMA . Front Desk/Registration: Don Perking  . Time spent on call: 15 minutes on the phone discussing health concerns. 10 minutes total spent in review of patient's record and preparation of their chart.  . I verified patient identity using two factors (patient name and date of birth). Patient consents verbally to being seen via telemedicine visit today.    EDEMA BILATERAL LEGS: Seen last for edema on 04/05/2019 and instructed to wear compression hose + vascular referral placed.  They did call patient to schedule on review, he reports they called him yesterday and is to see them next Thursday.  He feels that edema is getting better at this time and some of the sores are healing up.  Was seen on 03/11/2019 with HCTZ discontinued with Lasix 40 MG being started. He was recommended to start compression hose, but has not done this due to being too hard to put on with his RA.   Denies SOB, CP, palpitations.  BNP last visit 33.2 and CRT 1.10 + GFR 67.  Does endorse  enjoying salt, states he has cut back some.  Is urinating well without issue.  Has a history of non-Hodgkin's lymphoma treated with chemo in 1998.  Denies pain to lower extremities, other than his arthritis pain which is consistent and unchanged.  Denies any pain, redness, warmth, or tenderness to bilateral legs.   Paresthesias:   no Decreased sensation:  no Weakness:   no Insomnia:   no Fatigue:   no Alleviating factors: placing legs up Aggravating factors: being up and moving Status: fluctuating  Relevant past medical, surgical, family and social history reviewed and updated as indicated. Interim medical history since our last visit reviewed. Allergies and medications reviewed and updated.  Review of Systems  Constitutional: Negative for activity change, diaphoresis, fatigue and fever.  Respiratory: Negative for cough, chest tightness, shortness of breath and wheezing.   Cardiovascular: Positive for leg swelling. Negative for chest pain and palpitations.  Gastrointestinal: Negative.   Endocrine: Negative.   Genitourinary: Negative.   Neurological: Negative.   Psychiatric/Behavioral: Negative.     Per HPI unless specifically indicated above     Objective:    BP 120/78   Pulse 95   Wt Readings from Last 3 Encounters:  04/05/19 154 lb (69.9 kg)  03/11/19 150 lb (68 kg)  03/09/19 151 lb (68.5 kg)    Physical Exam   Unable to perform visual exam due to telephone visit  only.  Results for orders placed or performed in visit on 04/05/19  CBC with Differential/Platelet out  Result Value Ref Range   WBC 8.6 3.4 - 10.8 x10E3/uL   RBC 4.13 (L) 4.14 - 5.80 x10E6/uL   Hemoglobin 13.4 13.0 - 17.7 g/dL   MCV 99 (H) 79 - 97 fL   MCH 32.4 26.6 - 33.0 pg   MCHC 32.7 31.5 - 35.7 g/dL   RDW 12.8 11.6 - 15.4 %   Platelets 238 150 - 450 x10E3/uL   Neutrophils 75 Not Estab. %   Lymphs 13 Not Estab. %   Monocytes 10 Not Estab. %   Eos 0 Not Estab. %   Basos 1 Not Estab. %    Neutrophils Absolute 6.6 1.4 - 7.0 x10E3/uL   Lymphocytes Absolute 1.1 0.7 - 3.1 x10E3/uL   Monocytes Absolute 0.8 0.1 - 0.9 x10E3/uL   EOS (ABSOLUTE) 0.0 0.0 - 0.4 x10E3/uL   Basophils Absolute 0.1 0.0 - 0.2 x10E3/uL   Immature Granulocytes 1 Not Estab. %   Immature Grans (Abs) 0.0 0.0 - 0.1 x10E3/uL   Hematology Comments: Note:   Comprehensive metabolic panel  Result Value Ref Range   Glucose 114 (H) 65 - 99 mg/dL   BUN 19 8 - 27 mg/dL   Creatinine, Ser 1.10 0.76 - 1.27 mg/dL   GFR calc non Af Amer 67 >59 mL/min/1.73   GFR calc Af Amer 77 >59 mL/min/1.73   BUN/Creatinine Ratio 17 10 - 24   Sodium 140 134 - 144 mmol/L   Potassium 3.7 3.5 - 5.2 mmol/L   Chloride 97 96 - 106 mmol/L   CO2 29 20 - 29 mmol/L   Calcium 9.4 8.6 - 10.2 mg/dL   Total Protein 6.6 6.0 - 8.5 g/dL   Albumin 4.3 3.7 - 4.7 g/dL   Globulin, Total 2.3 1.5 - 4.5 g/dL   Albumin/Globulin Ratio 1.9 1.2 - 2.2   Bilirubin Total 0.4 0.0 - 1.2 mg/dL   Alkaline Phosphatase 83 39 - 117 IU/L   AST 24 0 - 40 IU/L   ALT 13 0 - 44 IU/L  Anemia panel  Result Value Ref Range   Total Iron Binding Capacity 380 250 - 450 ug/dL   UIBC 317 111 - 343 ug/dL   Iron 63 38 - 169 ug/dL   Iron Saturation 17 15 - 55 %   Vitamin B-12 913 232 - 1,245 pg/mL   Folate, Hemolysate 362.0 Not Estab. ng/mL   Hematocrit 41.0 37.5 - 51.0 %   Folate, RBC 883 >498 ng/mL   Ferritin 85 30 - 400 ng/mL   Retic Ct Pct 2.0 0.6 - 2.6 %      Assessment & Plan:   Problem List Items Addressed This Visit      Other   Bilateral leg edema    Ongoing issue with some improvement reported from previous appointment.  Will continue Lasix at this time.  Awaiting echo to assess heart function, did have normal BNP last visit.  Is scheduled to see vascular next week and will await their recommendations, suspect some lymphedema; however patient with difficulty placing compression hose due to RA.  May benefit from pump for compression.  Follow-up after vascular  appointment, sooner if worsening symptoms.         I discussed the assessment and treatment plan with the patient. The patient was provided an opportunity to ask questions and all were answered. The patient agreed with the plan and demonstrated an understanding  of the instructions.   The patient was advised to call back or seek an in-person evaluation if the symptoms worsen or if the condition fails to improve as anticipated.   I provided 15 minutes of time during this encounter.  Follow up plan: Return in about 2 weeks (around 04/29/2019) for Edema follow-up.

## 2019-04-22 ENCOUNTER — Encounter (INDEPENDENT_AMBULATORY_CARE_PROVIDER_SITE_OTHER): Payer: Medicare Other | Admitting: Vascular Surgery

## 2019-04-22 ENCOUNTER — Telehealth: Payer: Self-pay | Admitting: Nurse Practitioner

## 2019-04-22 NOTE — Telephone Encounter (Signed)
-----   Message from Venita Lick, NP sent at 04/15/2019 11:39 AM EST ----- 2 weeks for edema follow-up

## 2019-04-22 NOTE — Telephone Encounter (Signed)
Lvm for pt to call back. 

## 2019-04-26 ENCOUNTER — Other Ambulatory Visit: Payer: Self-pay

## 2019-04-26 ENCOUNTER — Ambulatory Visit (INDEPENDENT_AMBULATORY_CARE_PROVIDER_SITE_OTHER): Payer: Medicare Other | Admitting: Vascular Surgery

## 2019-04-26 ENCOUNTER — Encounter (INDEPENDENT_AMBULATORY_CARE_PROVIDER_SITE_OTHER): Payer: Self-pay | Admitting: Vascular Surgery

## 2019-04-26 ENCOUNTER — Encounter: Payer: Self-pay | Admitting: Nurse Practitioner

## 2019-04-26 VITALS — BP 116/72 | HR 130 | Resp 18 | Ht 68.0 in | Wt 157.0 lb

## 2019-04-26 DIAGNOSIS — I872 Venous insufficiency (chronic) (peripheral): Secondary | ICD-10-CM | POA: Diagnosis not present

## 2019-04-26 DIAGNOSIS — I890 Lymphedema, not elsewhere classified: Secondary | ICD-10-CM

## 2019-04-26 DIAGNOSIS — I10 Essential (primary) hypertension: Secondary | ICD-10-CM

## 2019-04-26 DIAGNOSIS — E782 Mixed hyperlipidemia: Secondary | ICD-10-CM

## 2019-04-26 DIAGNOSIS — I2510 Atherosclerotic heart disease of native coronary artery without angina pectoris: Secondary | ICD-10-CM

## 2019-04-26 NOTE — Progress Notes (Signed)
Lvm for f/u apt for edema. Sent letter.

## 2019-04-27 NOTE — Progress Notes (Signed)
Lvm to call back to make appointment.

## 2019-04-28 ENCOUNTER — Encounter (INDEPENDENT_AMBULATORY_CARE_PROVIDER_SITE_OTHER): Payer: Self-pay | Admitting: Vascular Surgery

## 2019-04-28 DIAGNOSIS — I890 Lymphedema, not elsewhere classified: Secondary | ICD-10-CM | POA: Insufficient documentation

## 2019-04-28 DIAGNOSIS — I872 Venous insufficiency (chronic) (peripheral): Secondary | ICD-10-CM | POA: Insufficient documentation

## 2019-04-28 NOTE — Progress Notes (Signed)
Lvm to make this appoitment.

## 2019-04-28 NOTE — Progress Notes (Signed)
MRN : [id number]  Travis Haynes is a 74 y.o. (14-Sep-1945) male who presents with chief complaint of  Chief Complaint  Patient presents with  . New Patient (Initial Visit)    edema  .  History of Present Illness:  Patient is seen for evaluation of leg swelling. The patient first noticed the swelling a few months ago which he states began acutely but is now concerned because of a significant increase in the overall edema. The swelling is associated with some pain and increasing discoloration. The patient notes that in the morning the legs are significantly improved but they steadily worsened throughout the course of the day. Elevation makes the legs better, dependency makes them much worse.  He was started on a diuretic but this has not helped  There is no history of ulcerations associated with the swelling.   The patient denies any recent changes in their medications.  The patient has not been wearing graduated compression.  The patient has no had any past angiography, interventions or vascular surgery.  The patient denies a history of DVT or PE. There is no prior history of phlebitis. There is no history of primary lymphedema.  There is no history of radiation treatment to the groin or pelvis No history of malignancies. No history of trauma or groin or pelvic surgery. No history of foreign travel or parasitic infections area    Current Meds  Medication Sig  . acetaminophen (TYLENOL) 500 MG tablet Take 1,000 mg by mouth every 6 (six) hours as needed for mild pain or moderate pain.   Marland Kitchen amLODipine (NORVASC) 5 MG tablet TAKE ONE (1) TABLET BY MOUTH EACH DAY (Patient taking differently: Take 5 mg by mouth every evening. )  . aspirin EC 81 MG tablet Take 1 tablet (81 mg total) by mouth daily.  Marland Kitchen atorvastatin (LIPITOR) 10 MG tablet TAKE 1 TABLET BY MOUTH EVERY DAY (Patient taking differently: Take 10 mg by mouth every evening. )  . Cholecalciferol (VITAMIN D-3) 1000 units CAPS Take  1,000 Units by mouth daily.  . furosemide (LASIX) 40 MG tablet Take 1 tablet (40 mg total) by mouth daily.  . naproxen sodium (ALEVE) 220 MG tablet Take 220-440 mg by mouth See admin instructions. Take 440 mg by mouth in the morning at 12pm. and 220 mg in the evening 12 AM  . omeprazole (PRILOSEC) 20 MG capsule TAKE 2 CAPS BY MOUTH DAILY (Patient taking differently: Take 20 mg by mouth 2 (two) times daily before a meal. )  . predniSONE (DELTASONE) 5 MG tablet Take 5 mg by mouth daily at 12 noon.   . traMADol (ULTRAM) 50 MG tablet Take 1 tablet (50 mg total) by mouth daily as needed. (Patient taking differently: Take 50 mg by mouth every 4 (four) hours. )  . vitamin B-12 (CYANOCOBALAMIN) 1000 MCG tablet Take 1,000 mcg by mouth daily.   Current Facility-Administered Medications for the 04/26/19 encounter (Office Visit) with Delana Meyer, Dolores Lory, MD  Medication  . triamcinolone acetonide (KENALOG-40) injection 40 mg    Past Medical History:  Diagnosis Date  . Arthritis    rheumatoid arthritis, takes prednisone for this  . Barrett's esophagus   . Cancer ([place]) 1999   non hodgkins lymphoma  . Chronic back pain   . Chronic pain syndrome   . Claustrophobia   . Coronary artery disease   . GERD (gastroesophageal reflux disease)    barretts esophagus that requires dilation every couple years  . Hemorrhoids   .  Hernia of abdominal cavity   . History of hiatal hernia   . Hyperlipidemia   . Hypertension   . Lumbago   . 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  . Weight loss 11/2017   30 lb weight loss over 3 years    Past Surgical History:  Procedure Laterality Date  . APPENDECTOMY  1954  . [place]   2 fusions then 1 surgery to fix the other two (lower)..plates and screws  . CATARACT EXTRACTION W/PHACO Left 12/31/2018   Procedure: CATARACT EXTRACTION PHACO AND INTRAOCULAR LENS PLACEMENT ([place]) LEFT VISION BLUE;  Surgeon: Marchia Meiers, MD;  Location: [facility] ORS;   Service: Ophthalmology;  Laterality: Left;  Lot [id number] H [place]: 01:51.7 CDE: 18.59  . CYSTECTOMY     from buttocks  . ESOPHAGEAL DILATION     multiple times  . ESOPHAGOGASTRODUODENOSCOPY (EGD) WITH PROPOFOL N/A 12/05/2014   Procedure: ESOPHAGOGASTRODUODENOSCOPY (EGD) WITH PROPOFOL;  Surgeon: Hulen Luster, MD;  Location: [facility] ENDOSCOPY;  Service: Gastroenterology;  Laterality: N/A;  . EXCISION PARTIAL PHALANX Right 12/09/2017   Procedure: EXCISION PARTIAL PHALANX-GREAT TOE;  Surgeon: Sharlotte Alamo, DPM;  Location: [facility] ORS;  Service: Podiatry;  Laterality: Right;  . FOOT SURGERY    . intestine repair  1998   during biopsy his intestines were nicked requiring repair  . [place]   fused 2 vertebrae in neck (plate plus screws)  . RADIOLOGY WITH ANESTHESIA N/A 03/09/2019   Procedure: MRI WITH ANESTHESIA LUMBAR WITHOUT CONTRAST;  Surgeon: Radiologist, Medication, MD;  Location: [facility];  Service: Radiology;  Laterality: N/A;  . Reduction masseter muscle/bone extraoral      Social History Social History   Tobacco Use  . Smoking status: Former Smoker    Types: Cigarettes    Quit date: 1998    Years since quitting: 23.1  . Smokeless tobacco: Never Used  Substance Use Topics  . Alcohol use: No    Comment: stopped d/t cluster headaches  . Drug use: No    Family History Family History  Problem Relation Age of Onset  . Heart disease Mother        heart attack  . Arthritis Mother   . Arthritis Father     Allergies  Allergen Reactions  . Ativan [Lorazepam] Other (See Comments)    Pt states medication made him hyper  . Gold Au 198 [Gold] Other (See Comments)    Pt states platelet count was low. This was in 48.     REVIEW OF SYSTEMS (Negative unless checked)  Constitutional: [] Weight loss  [] Fever  [] Chills Cardiac: [] Chest pain   [] Chest pressure   [] Palpitations   [] Shortness of breath when laying flat   [] Shortness of breath with exertion. Vascular:  [] Pain in legs with  walking   [x] Pain in legs at rest  [] History of DVT   [] Phlebitis   [x] Swelling in legs   [] Varicose veins   [] Non-healing ulcers Pulmonary:   [] Uses home oxygen   [] Productive cough   [] Hemoptysis   [] Wheeze  [] COPD   [] Asthma Neurologic:  [] Dizziness   [] Seizures   [] History of stroke   [] History of TIA  [] Aphasia   [] Vissual changes   [] Weakness or numbness in arm   [] Weakness or numbness in leg Musculoskeletal:   [] Joint swelling   [] Joint pain   [] Low back pain Hematologic:  [] Easy bruising  [] Easy bleeding   [] Hypercoagulable state   [] Anemic Gastrointestinal:  [] Diarrhea   [] Vomiting  []   Gastroesophageal reflux/heartburn   [] Difficulty swallowing. Genitourinary:  [] Chronic kidney disease   [] Difficult urination  [] Frequent urination   [] Blood in urine Skin:  [] Rashes   [] Ulcers  Psychological:  [] History of anxiety   []  History of major depression.  Physical Examination  Vitals:   04/26/19 1456  BP: 116/72  Pulse: (!) 130  Resp: 18  Weight: 157 lb (71.2 kg)  Height: 5\' 8"  (1.727 m)   Body mass index is 23.87 kg/m. Gen: WD/WN, NAD Head: [place]/AT, No temporalis wasting.  Ear/Nose/Throat: Hearing grossly intact, nares w/o erythema or drainage Eyes: PER, EOMI, sclera nonicteric.  Neck: Supple, no large masses.   Pulmonary:  Good air movement, no audible wheezing bilaterally, no use of accessory muscles.  Cardiac: RRR, no JVD Vascular: scattered varicosities present bilaterally.  Moderate venous stasis changes to the legs bilaterally.  3-4+ soft pitting edema Vessel Right Left  Radial Palpable Palpable  PT Palpable Palpable  DP Palpable Palpable  Gastrointestinal: Non-distended. No guarding/no peritoneal signs.  Musculoskeletal: M/S 5/5 throughout.  No deformity or atrophy.  Neurologic: CN 2-12 intact. Symmetrical.  Speech is fluent. Motor exam as listed above. Psychiatric: Judgment intact, Mood & affect appropriate for pt's clinical situation. Dermatologic: No rashes or ulcers  noted.  No changes consistent with cellulitis.  CBC Lab Results  Component Value Date   WBC 8.6 04/05/2019   HGB 13.4 04/05/2019   HCT 41.0 04/05/2019   MCV 99 (H) 04/05/2019   PLT 238 04/05/2019    BMET    Component Value Date/Time   NA 140 04/05/2019 1645   K 3.7 04/05/2019 1645   CL 97 04/05/2019 1645   CO2 29 04/05/2019 1645   GLUCOSE 114 (H) 04/05/2019 1645   BUN 19 04/05/2019 1645   CREATININE 1.10 04/05/2019 1645   CALCIUM 9.4 04/05/2019 1645   GFRNONAA 67 04/05/2019 1645   GFRAA 77 04/05/2019 1645   CrCl cannot be calculated (Patient's most recent lab result is older than the maximum 21 days allowed.).  COAG Lab Results  Component Value Date   INR 1.0 07/28/2017    Radiology No results found.    Assessment/Plan 1. Chronic venous insufficiency No surgery or intervention at this point in time.    I have had a long discussion with the patient regarding venous insufficiency and why it  causes symptoms. I have discussed with the patient the chronic skin changes that accompany venous insufficiency and the long term sequela such as infection and ulceration.  Patient will begin wearing graduated compression stockings class 1 (20-30 mmHg) or compression wraps on a daily basis a prescription was given. The patient will put the stockings on first thing in the morning and removing them in the evening. The patient is instructed specifically not to sleep in the stockings.    In addition, behavioral modification including several periods of elevation of the lower extremities during the day will be continued. I have demonstrated that proper elevation is a position with the ankles at heart level.  The patient is instructed to begin routine exercise, especially walking on a daily basis  Patient should undergo duplex ultrasound of the venous system to ensure that DVT or reflux is not present.  Following the review of the ultrasound the patient will follow up in 2-3 months to  reassess the degree of swelling and the control that graduated compression stockings or compression wraps  is offering.   The patient can be assessed for a Lymph Pump at that time  2. Lymphedema No surgery or intervention at this point in time.    I have had a long discussion with the patient regarding venous insufficiency and why it  causes symptoms. I have discussed with the patient the chronic skin changes that accompany venous insufficiency and the long term sequela such as infection and ulceration.  Patient will begin wearing graduated compression stockings class 1 (20-30 mmHg) or compression wraps on a daily basis a prescription was given. The patient will put the stockings on first thing in the morning and removing them in the evening. The patient is instructed specifically not to sleep in the stockings.    In addition, behavioral modification including several periods of elevation of the lower extremities during the day will be continued. I have demonstrated that proper elevation is a position with the ankles at heart level.  The patient is instructed to begin routine exercise, especially walking on a daily basis  Patient should undergo duplex ultrasound of the venous system to ensure that DVT or reflux is not present.  Following the review of the ultrasound the patient will follow up in 2-3 months to reassess the degree of swelling and the control that graduated compression stockings or compression wraps  is offering.   The patient can be assessed for a Lymph Pump at that time  3. Essential hypertension Continue antihypertensive medications as already ordered, these medications have been reviewed and there are no changes at this time.   4. Arteriosclerosis of coronary artery Continue cardiac and antihypertensive medications as already ordered and reviewed, no changes at this time.  Continue statin as ordered and reviewed, no changes at this time  Nitrates PRN for chest pain   5.  Mixed hyperlipidemia Continue statin as ordered and reviewed, no changes at this time     Hortencia Pilar, MD  04/28/2019 3:54 PM

## 2019-04-29 ENCOUNTER — Encounter: Payer: Self-pay | Admitting: Nurse Practitioner

## 2019-04-29 ENCOUNTER — Ambulatory Visit (INDEPENDENT_AMBULATORY_CARE_PROVIDER_SITE_OTHER): Payer: Medicare Other | Admitting: Nurse Practitioner

## 2019-04-29 DIAGNOSIS — I890 Lymphedema, not elsewhere classified: Secondary | ICD-10-CM

## 2019-04-29 MED ORDER — MUPIROCIN 2 % EX OINT: 1.0000 "application " | TOPICAL_OINTMENT | Freq: Two times a day (BID) | CUTANEOUS | 1 refills | Status: DC

## 2019-04-29 NOTE — Progress Notes (Signed)
BP 127/79   Pulse 86    Subjective:    Patient ID: Travis Haynes, male    DOB: May 04, 1945, 74 y.o.   MRN: [id number]  HPI: Travis Haynes is a 74 y.o. male  Chief Complaint  Patient presents with  . Edema    This visit was completed via telephone due to the restrictions of the COVID-19 pandemic. All issues as above were discussed and addressed but no physical exam was performed. If it was felt that the patient should be evaluated in the office, they were directed there. The patient verbally consented to this visit. Patient was unable to complete an audio/visual visit due to Lack of equipment. Due to the catastrophic nature of the COVID-19 pandemic, this visit was done through audio contact only.  Location of the patient: home  Location of the provider: home  Those involved with this call:  ? Provider: Marnee Guarneri, DNP ? CMA: Yvonna Alanis, CMA ? Front Desk/Registration: Don Perking   Time spent on call: 15 minutes on the phone discussing health concerns. 10 minutes total spent in review of patient's record and preparation of their chart.   I verified patient identity using two factors (patient name and date of birth). Patient consents verbally to being seen via telemedicine visit today.   EDEMA BILATERAL LEGS: Seen last for edema on 04/05/2019 and instructed to wear compression hose + vascular referral placed.  He had visit with Dr. Delana Meyer on 04/26/2019 and recommend compression hose or wraps, script was sent.  Plan is for duplex u/s to ensure no issues.  Chronic venous insufficiency and lymphedema diagnosis.  He is to return in 2-3 months.  Was seen on 03/11/2019 HCTZ discontinued with Lasix 40 MG being started.He was recommended to start compression hose, but has not done this due to being too hard to put on with his RA and plans on talking to vascular about having the wraps they mentioned instead.  Denies SOB, CP, palpitations. BNP last visit 33.2 and CRT  1.10 + GFR 67.   Does endorse enjoying salt, states he has cut back some. Is urinating well without issue. Has a history of non-Hodgkin's lymphoma treated with chemo in 1998. He denies fever, weight loss, or night sweats at this time.  Denies pain to lower extremities, other than his arthritis pain which is consistent and unchanged.  Denies any pain, redness, warmth, or tenderness to bilateral legs.   Paresthesias:no Decreased sensation:no Weakness:no Insomnia:no Fatigue:no Alleviating factors:placing legs up Aggravating factors:being up and moving Status:fluctuating  Relevant past medical, surgical, family and social history reviewed and updated as indicated. Interim medical history since our last visit reviewed. Allergies and medications reviewed and updated.  Review of Systems  Constitutional: Negative for activity change, diaphoresis, fatigue and fever.  Respiratory: Negative for cough, chest tightness, shortness of breath and wheezing.   Cardiovascular: Positive for leg swelling. Negative for chest pain and palpitations.  Gastrointestinal: Negative.   Endocrine: Negative.   Genitourinary: Negative.   Neurological: Negative.   Psychiatric/Behavioral: Negative.     Per HPI unless specifically indicated above     Objective:    BP 127/79   Pulse 86   Wt Readings from Last 3 Encounters:  04/26/19 157 lb (71.2 kg)  04/05/19 154 lb (69.9 kg)  03/11/19 150 lb (68 kg)    Physical Exam   Unable to perform due to telephone visit only.  Results for orders placed or performed in visit on 04/05/19  CBC with  Differential/Platelet out  Result Value Ref Range   WBC 8.6 3.4 - 10.8 x10E3/uL   RBC 4.13 (L) 4.14 - 5.80 x10E6/uL   Hemoglobin 13.4 13.0 - 17.7 g/dL   MCV 99 (H) 79 - 97 fL   MCH 32.4 26.6 - 33.0 pg   MCHC 32.7 31.5 - 35.7 g/dL   RDW 12.8 11.6 - 15.4 %   Platelets 238 150 - 450 x10E3/uL   Neutrophils 75 Not Estab. %   Lymphs 13 Not Estab. %    Monocytes 10 Not Estab. %   Eos 0 Not Estab. %   Basos 1 Not Estab. %   Neutrophils Absolute 6.6 1.4 - 7.0 x10E3/uL   Lymphocytes Absolute 1.1 0.7 - 3.1 x10E3/uL   Monocytes Absolute 0.8 0.1 - 0.9 x10E3/uL   EOS (ABSOLUTE) 0.0 0.0 - 0.4 x10E3/uL   Basophils Absolute 0.1 0.0 - 0.2 x10E3/uL   Immature Granulocytes 1 Not Estab. %   Immature Grans (Abs) 0.0 0.0 - 0.1 x10E3/uL   Hematology Comments: Note:   Comprehensive metabolic panel  Result Value Ref Range   Glucose 114 (H) 65 - 99 mg/dL   BUN 19 8 - 27 mg/dL   Creatinine, Ser 1.10 0.76 - 1.27 mg/dL   GFR calc non Af Amer 67 >59 mL/min/1.73   GFR calc Af Amer 77 >59 mL/min/1.73   BUN/Creatinine Ratio 17 10 - 24   Sodium 140 134 - 144 mmol/L   Potassium 3.7 3.5 - 5.2 mmol/L   Chloride 97 96 - 106 mmol/L   CO2 29 20 - 29 mmol/L   Calcium 9.4 8.6 - 10.2 mg/dL   Total Protein 6.6 6.0 - 8.5 g/dL   Albumin 4.3 3.7 - 4.7 g/dL   Globulin, Total 2.3 1.5 - 4.5 g/dL   Albumin/Globulin Ratio 1.9 1.2 - 2.2   Bilirubin Total 0.4 0.0 - 1.2 mg/dL   Alkaline Phosphatase 83 39 - 117 IU/L   AST 24 0 - 40 IU/L   ALT 13 0 - 44 IU/L  Anemia panel  Result Value Ref Range   Total Iron Binding Capacity 380 250 - 450 ug/dL   UIBC 317 111 - 343 ug/dL   Iron 63 38 - 169 ug/dL   Iron Saturation 17 15 - 55 %   Vitamin B-12 913 232 - 1,245 pg/mL   Folate, Hemolysate 362.0 Not Estab. ng/mL   Hematocrit 41.0 37.5 - 51.0 %   Folate, RBC 883 >498 ng/mL   Ferritin 85 30 - 400 ng/mL   Retic Ct Pct 2.0 0.6 - 2.6 %      Assessment & Plan:   Problem List Items Addressed This Visit      Other   Lymphedema    Ongoing, unable to place compression hose due to RA and is going to look into compression wraps.  Currently being followed by vascular and plan is for duplex u/s and follow-up in 2-3 months.  He reports some improvement, but not 100%.  Will send in refill on Mupirocin for if blisters appear.  To return to office immediately if worsening symptoms or s/s  infection present (reviewed these with him).  Will obtain outpatient labs to check K+, as 3.3 on December labs and currently taking Lasix.  Follow-up in 3 months.      Relevant Orders   Basic Metabolic Panel (BMET)   Uric acid      I discussed the assessment and treatment plan with the patient. The patient was provided an  opportunity to ask questions and all were answered. The patient agreed with the plan and demonstrated an understanding of the instructions.   The patient was advised to call back or seek an in-person evaluation if the symptoms worsen or if the condition fails to improve as anticipated.   I provided 15 minutes of time during this encounter.  Follow up plan: Return in about 3 months (around 07/27/2019) for RA, HTN, HLD, Lymphedema.

## 2019-04-29 NOTE — Assessment & Plan Note (Addendum)
Ongoing, unable to place compression hose due to RA and is going to look into compression wraps.  Currently being followed by vascular and plan is for duplex u/s and follow-up in 2-3 months.  He reports some improvement, but not 100%.  Will send in refill on Mupirocin for if blisters appear.  To return to office immediately if worsening symptoms or s/s infection present (reviewed these with him).  Will obtain outpatient labs to check K+, as 3.3 on December labs and currently taking Lasix.  Follow-up in 3 months.

## 2019-04-29 NOTE — Patient Instructions (Signed)

## 2019-04-29 NOTE — Progress Notes (Signed)
Called pt to schedule f/u he states that he will contact us back to schedule.

## 2019-06-17 ENCOUNTER — Other Ambulatory Visit: Payer: Self-pay | Admitting: Nurse Practitioner

## 2019-06-17 ENCOUNTER — Telehealth (INDEPENDENT_AMBULATORY_CARE_PROVIDER_SITE_OTHER): Payer: Medicare Other | Admitting: Nurse Practitioner

## 2019-06-17 ENCOUNTER — Encounter: Payer: Self-pay | Admitting: Nurse Practitioner

## 2019-06-17 VITALS — BP 122/75 | HR 92 | Temp 96.7°F | Wt 161.8 lb

## 2019-06-17 DIAGNOSIS — J329 Chronic sinusitis, unspecified: Secondary | ICD-10-CM | POA: Insufficient documentation

## 2019-06-17 DIAGNOSIS — M549 Dorsalgia, unspecified: Secondary | ICD-10-CM | POA: Diagnosis not present

## 2019-06-17 DIAGNOSIS — J069 Acute upper respiratory infection, unspecified: Secondary | ICD-10-CM | POA: Diagnosis not present

## 2019-06-17 NOTE — Patient Instructions (Signed)

## 2019-06-17 NOTE — Assessment & Plan Note (Signed)
New acute on chronic back pain over past month, upper back.  No relief with Tramadol or Naproxen.  Concern as cough present, new onset.  Due to restrictions of telephone visit have highly recommended patient be seen today in either ER or UC setting.  Would benefit from face to face assessment + labs and imaging to further assess.  R/O PNA vs underlying cardiac cause.  He agrees with plan of care and plans to go to [facility] today for further evaluation.  He will follow-up with provider after UC visit.

## 2019-06-17 NOTE — Assessment & Plan Note (Signed)
Acute over past 2 weeks, with fluctuating upper back pain.  Due to restrictions of telephone visit only have highly recommended patient be seen today in either ER or UC setting.  Would benefit from face to face assessment + labs and imaging to further assess + Covid testing, he is high risk for Covid.  R/O PNA vs underlying cardiac cause.  He agrees with plan of care and plans to go to [facility] today for further evaluation.  He agrees to follow-up with PCP after UC visit.

## 2019-06-17 NOTE — Progress Notes (Signed)
BP 122/75   Pulse 92   Temp (!) 96.7 F (35.9 C)   Wt 161 lb 12.8 oz (73.4 kg)   SpO2 96%   BMI 24.60 kg/m    Subjective:    Patient ID: Travis Haynes, male    DOB: 03-27-1945, 74 y.o.   MRN: [id number]  HPI: Travis Haynes is a 74 y.o. male  Chief Complaint  Patient presents with  . URI    pt states he has been hvaing chest congestion and yellow phlegm for the past 2 weeks   . Back Pain    pt states he has been having upper back pain lately. States it moves around from one side to the other every few days    . This visit was completed via telephone due to the restrictions of the COVID-19 pandemic. All issues as above were discussed and addressed but no physical exam was performed. If it was felt that the patient should be evaluated in the office, they were directed there. The patient verbally consented to this visit. Patient was unable to complete an audio/visual visit due to telephone . Due to the catastrophic nature of the COVID-19 pandemic, this visit was done through audio contact only. . Location of the patient: home . Location of the provider: home . Those involved with this call:  . Provider: Marnee Guarneri, DNP . CMA: Yvonna Alanis, CMA . Front Desk/Registration: Don Perking  . Time spent on call: 15 minutes on the phone discussing health concerns. 10 minutes total spent in review of patient's record and preparation of their chart.  . I verified patient identity using two factors (patient name and date of birth). Patient consents verbally to being seen via telemedicine visit today.    UPPER RESPIRATORY TRACT INFECTION Reports symptoms x 2 weeks, coughing up yellow mucus.  More at night and in morning this is noted.  Sleeps on one pillow at night, no orthopnea.  Continues to have some edema in bilateral legs.  Denies any loss of taste or smell.  Has not been around Covid positive patient that he is aware of. Fever: no Cough: yes Shortness of breath: if  really active or going to bed Wheezing: no Chest pain: no Chest tightness: yes Chest congestion: yes Nasal congestion: no Runny nose: no Post nasal drip: no Sneezing: no Sore throat: yes -- had it one day and then this went away Swollen glands: no Sinus pressure: no Headache: no Face pain: no Toothache: no Ear pain: none Ear pressure: none Eyes red/itching:no Eye drainage/crusting: no  Vomiting: no Rash: no Fatigue: occasional Sick contacts: no Strep contacts: no  Context: stable Recurrent sinusitis: no Relief with OTC cold/cough medications: no  Treatments attempted: none   BACK PAIN Has underlying RA and is followed at [facility], takes Tramadol 50 MG Q4H as needed.  Last office visit with rheumatology was in May 2019.  Is attending physical medicine clinic at [facility] and last saw 04/09/2019.  Has noticed sharp pain under shoulder blades over past month and to right side and in between shoulder blades -- intermittent, lasts a few days and then eases off an quits.  States the Tramadol is not helping this pain.  Currently has not bothered him in 4 days. Duration: chronic with new acute pain Mechanism of injury: unknown Location: upper Onset: sudden Severity: 7/10 at worst Quality: sharp Frequency: intermittent Radiation: none Aggravating factors: lifting, movement and coughing Alleviating factors: nothing Status: fluctuating Treatments attempted: Tramadol and Naproxen  Relief with NSAIDs?: no Nighttime pain:  no Paresthesias / decreased sensation:  no Bowel / bladder incontinence:  no Fevers:  no Dysuria / urinary frequency:  no  Relevant past medical, surgical, family and social history reviewed and updated as indicated. Interim medical history since our last visit reviewed. Allergies and medications reviewed and updated.  Review of Systems  Constitutional: Positive for fatigue. Negative for activity change, appetite change, chills, diaphoresis and fever.  HENT: Positive  for sore throat. Negative for ear pain, postnasal drip, rhinorrhea, sinus pressure, sinus pain, sneezing and voice change.   Respiratory: Positive for cough and shortness of breath. Negative for chest tightness and wheezing.   Cardiovascular: Positive for leg swelling. Negative for chest pain and palpitations.  Gastrointestinal: Negative.   Endocrine: Negative for polyphagia.  Musculoskeletal: Positive for back pain.  Neurological: Negative.   Psychiatric/Behavioral: Negative.     Per HPI unless specifically indicated above     Objective:    BP 122/75   Pulse 92   Temp (!) 96.7 F (35.9 C)   Wt 161 lb 12.8 oz (73.4 kg)   SpO2 96%   BMI 24.60 kg/m   Wt Readings from Last 3 Encounters:  06/17/19 161 lb 12.8 oz (73.4 kg)  04/26/19 157 lb (71.2 kg)  04/05/19 154 lb (69.9 kg)    Physical Exam   Unable to perform due to telephone visit only  Results for orders placed or performed in visit on 04/05/19  CBC with Differential/Platelet out  Result Value Ref Range   WBC 8.6 3.4 - 10.8 x10E3/uL   RBC 4.13 (L) 4.14 - 5.80 x10E6/uL   Hemoglobin 13.4 13.0 - 17.7 g/dL   MCV 99 (H) 79 - 97 fL   MCH 32.4 26.6 - 33.0 pg   MCHC 32.7 31.5 - 35.7 g/dL   RDW 12.8 11.6 - 15.4 %   Platelets 238 150 - 450 x10E3/uL   Neutrophils 75 Not Estab. %   Lymphs 13 Not Estab. %   Monocytes 10 Not Estab. %   Eos 0 Not Estab. %   Basos 1 Not Estab. %   Neutrophils Absolute 6.6 1.4 - 7.0 x10E3/uL   Lymphocytes Absolute 1.1 0.7 - 3.1 x10E3/uL   Monocytes Absolute 0.8 0.1 - 0.9 x10E3/uL   EOS (ABSOLUTE) 0.0 0.0 - 0.4 x10E3/uL   Basophils Absolute 0.1 0.0 - 0.2 x10E3/uL   Immature Granulocytes 1 Not Estab. %   Immature Grans (Abs) 0.0 0.0 - 0.1 x10E3/uL   Hematology Comments: Note:   Comprehensive metabolic panel  Result Value Ref Range   Glucose 114 (H) 65 - 99 mg/dL   BUN 19 8 - 27 mg/dL   Creatinine, Ser 1.10 0.76 - 1.27 mg/dL   GFR calc non Af Amer 67 >59 mL/min/1.73   GFR calc Af Amer 77 >59  mL/min/1.73   BUN/Creatinine Ratio 17 10 - 24   Sodium 140 134 - 144 mmol/L   Potassium 3.7 3.5 - 5.2 mmol/L   Chloride 97 96 - 106 mmol/L   CO2 29 20 - 29 mmol/L   Calcium 9.4 8.6 - 10.2 mg/dL   Total Protein 6.6 6.0 - 8.5 g/dL   Albumin 4.3 3.7 - 4.7 g/dL   Globulin, Total 2.3 1.5 - 4.5 g/dL   Albumin/Globulin Ratio 1.9 1.2 - 2.2   Bilirubin Total 0.4 0.0 - 1.2 mg/dL   Alkaline Phosphatase 83 39 - 117 IU/L   AST 24 0 - 40 IU/L   ALT 13  0 - 44 IU/L  Anemia panel  Result Value Ref Range   Total Iron Binding Capacity 380 250 - 450 ug/dL   UIBC 317 111 - 343 ug/dL   Iron 63 38 - 169 ug/dL   Iron Saturation 17 15 - 55 %   Vitamin B-12 913 232 - 1,245 pg/mL   Folate, Hemolysate 362.0 Not Estab. ng/mL   Hematocrit 41.0 37.5 - 51.0 %   Folate, RBC 883 >498 ng/mL   Ferritin 85 30 - 400 ng/mL   Retic Ct Pct 2.0 0.6 - 2.6 %      Assessment & Plan:   Problem List Items Addressed This Visit      Respiratory   Upper respiratory tract infection - Primary    Acute over past 2 weeks, with fluctuating upper back pain.  Due to restrictions of telephone visit only have highly recommended patient be seen today in either ER or UC setting.  Would benefit from face to face assessment + labs and imaging to further assess + Covid testing, he is high risk for Covid.  R/O PNA vs underlying cardiac cause.  He agrees with plan of care and plans to go to [facility] today for further evaluation.  He agrees to follow-up with PCP after UC visit.          Other   Upper back pain    New acute on chronic back pain over past month, upper back.  No relief with Tramadol or Naproxen.  Concern as cough present, new onset.  Due to restrictions of telephone visit have highly recommended patient be seen today in either ER or UC setting.  Would benefit from face to face assessment + labs and imaging to further assess.  R/O PNA vs underlying cardiac cause.  He agrees with plan of care and plans to go to [facility] today for further evaluation.  He will follow-up with provider after UC visit.        Relevant Medications   predniSONE (DELTASONE) 5 MG tablet      I discussed the assessment and treatment plan with the patient. The patient was provided an opportunity to ask questions and all were answered. The patient agreed with the plan and demonstrated an understanding of the instructions.   The patient was advised to call back or seek an in-person evaluation if the symptoms worsen or if the condition fails to improve as anticipated.  He is to head to UC or ER setting today for further evaluation due to restrictions of telephone visit only.   I provided 15+ minutes of time during this encounter.  Follow up plan: Return for to follow-up after UC or ER visit.

## 2019-06-21 ENCOUNTER — Other Ambulatory Visit: Payer: Self-pay

## 2019-06-21 ENCOUNTER — Ambulatory Visit
Admission: EM | Admit: 2019-06-21 | Discharge: 2019-06-21 | Disposition: A | Discharge status: Home / Self Care | Payer: Medicare Other | Attending: Family Medicine | Admitting: Family Medicine

## 2019-06-21 ENCOUNTER — Ambulatory Visit (INDEPENDENT_AMBULATORY_CARE_PROVIDER_SITE_OTHER): Payer: Medicare Other

## 2019-06-21 ENCOUNTER — Encounter: Payer: Self-pay | Admitting: Emergency Medicine

## 2019-06-21 ENCOUNTER — Telehealth: Payer: Self-pay | Admitting: Nurse Practitioner

## 2019-06-21 DIAGNOSIS — R05 Cough: Secondary | ICD-10-CM

## 2019-06-21 DIAGNOSIS — S2249XA Multiple fractures of ribs, unspecified side, initial encounter for closed fracture: Secondary | ICD-10-CM

## 2019-06-21 DIAGNOSIS — R059 Cough, unspecified: Secondary | ICD-10-CM

## 2019-06-21 MED ORDER — HYDROCOD POLST-CPM POLST ER 10-8 MG/5ML PO SUER: 5.0000 mL | Freq: Two times a day (BID) | ORAL | 0 refills | Status: DC | PRN

## 2019-06-21 MED ORDER — DOXYCYCLINE HYCLATE 100 MG PO TABS: 100.0000 mg | ORAL_TABLET | Freq: Two times a day (BID) | ORAL | 0 refills | Status: DC

## 2019-06-21 NOTE — ED Triage Notes (Signed)
Pt c/o cough, upper back pain (when taking a deep breath). Started about 2 weeks ago. Denies fever. He states he was sent over to [facility] for a chest xray. He declines covid testing.

## 2019-06-21 NOTE — ED Provider Notes (Signed)
[facility]    CSN: [id number] Arrival date & time: 06/21/19  1851      History   Chief Complaint Chief Complaint  Patient presents with  . Cough  . Back Pain    HPI Travis Haynes is a 74 y.o. male.   74 yo male presents with a c/o cough and upper back pain for the past 2-3 weeks. States back pain is worse when he takes a deep breath. Denies any fevers or chills. States cough is occasionally productive of yellow sputum. Patient states he's a former smoker and smoked for many years. He also takes prednisone chronically for RA. Has been prescribed Fosamax for osteoporosis but states he decided not to take it.    Cough Back Pain   Past Medical History:  Diagnosis Date  . Arthritis    rheumatoid arthritis, takes prednisone for this  . Barrett's esophagus   . Cancer ([place]) 1999   non hodgkins lymphoma  . Chronic back pain   . Chronic pain syndrome   . Claustrophobia   . Coronary artery disease   . GERD (gastroesophageal reflux disease)    barretts esophagus that requires dilation every couple years  . Hemorrhoids   . Hernia of abdominal cavity   . History of hiatal hernia   . Hyperlipidemia   . Hypertension   . Lumbago   . 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  . Weight loss 11/2017   30 lb weight loss over 3 years    Patient Active Problem List   Diagnosis Date Noted  . Upper back pain 06/17/2019  . Upper respiratory tract infection 06/17/2019  . Chronic venous insufficiency 04/28/2019  . Lymphedema 04/28/2019  . Vitamin B12 deficiency 08/11/2018  . Leukocytosis 11/16/2017  . History of non-Hodgkin's lymphoma 11/16/2017  . Shoulder impingement syndrome, right 10/08/2017  . Allergy to alpha-gal 10/08/2017  . Macrocytosis 07/30/2017  . Nocturnal leg cramps 09/30/2016  . Advanced care planning/counseling discussion 07/22/2016  . Allergic rhinitis 01/23/2015  . Chronic back pain 01/16/2015  . Arteriosclerosis of coronary artery  01/16/2015  . Essential hypertension 01/16/2015  . Hyperlipidemia 01/16/2015  . Chronic pain 01/16/2015  . GERD (gastroesophageal reflux disease) 01/16/2015  . Tuberculosis 01/16/2015  . Eczema intertrigo 01/19/2013  . Rheumatoid arthritis ([place]) 06/30/2012    Past Surgical History:  Procedure Laterality Date  . APPENDECTOMY  1954  . [place]   2 fusions then 1 surgery to fix the other two (lower)..plates and screws  . CATARACT EXTRACTION W/PHACO Left 12/31/2018   Procedure: CATARACT EXTRACTION PHACO AND INTRAOCULAR LENS PLACEMENT ([place]) LEFT VISION BLUE;  Surgeon: Marchia Meiers, MD;  Location: [facility] ORS;  Service: Ophthalmology;  Laterality: Left;  Lot [id number] H [place]: 01:51.7 CDE: 18.59  . CYSTECTOMY     from buttocks  . ESOPHAGEAL DILATION     multiple times  . ESOPHAGOGASTRODUODENOSCOPY (EGD) WITH PROPOFOL N/A 12/05/2014   Procedure: ESOPHAGOGASTRODUODENOSCOPY (EGD) WITH PROPOFOL;  Surgeon: Hulen Luster, MD;  Location: [facility] ENDOSCOPY;  Service: Gastroenterology;  Laterality: N/A;  . EXCISION PARTIAL PHALANX Right 12/09/2017   Procedure: EXCISION PARTIAL PHALANX-GREAT TOE;  Surgeon: Sharlotte Alamo, DPM;  Location: [facility] ORS;  Service: Podiatry;  Laterality: Right;  . FOOT SURGERY    . intestine repair  1998   during biopsy his intestines were nicked requiring repair  . [place]   fused 2 vertebrae in neck (plate plus screws)  . RADIOLOGY WITH ANESTHESIA N/A  03/09/2019   Procedure: MRI WITH ANESTHESIA LUMBAR WITHOUT CONTRAST;  Surgeon: Radiologist, Medication, MD;  Location: [place];  Service: Radiology;  Laterality: N/A;  . Reduction masseter muscle/bone extraoral         Home Medications    Prior to Admission medications   Medication Sig Start Date End Date Taking? Authorizing Provider  acetaminophen (TYLENOL) 500 MG tablet Take 1,000 mg by mouth every 6 (six) hours as needed for mild pain or moderate pain.    Yes [provider]  amLODipine (NORVASC)  5 MG tablet TAKE ONE (1) TABLET BY MOUTH EACH DAY Patient taking differently: Take 5 mg by mouth every evening.  02/04/19  Yes Cannady, Henrine Screws T, NP  aspirin EC 81 MG tablet Take 1 tablet (81 mg total) by mouth daily. 07/22/16  Yes Kathrine Haddock, NP  atorvastatin (LIPITOR) 10 MG tablet TAKE 1 TABLET BY MOUTH EVERY DAY Patient taking differently: Take 10 mg by mouth every evening.  12/28/18  Yes Cannady, Jolene T, NP  furosemide (LASIX) 40 MG tablet Take 1 tablet (40 mg total) by mouth daily. 03/11/19  Yes Johnson, Megan P, DO  naproxen sodium (ALEVE) 220 MG tablet Take 220-440 mg by mouth See admin instructions. Take 440 mg by mouth in the morning at 12pm. and 220 mg in the evening 12 AM   Yes [provider]  omeprazole (PRILOSEC) 20 MG capsule TAKE 2 CAPSULES BY MOUTH EVERY DAY 06/17/19  Yes Cannady, Jolene T, NP  predniSONE (DELTASONE) 5 MG tablet Take 1.5 tablets by mouth daily. 05/05/19  Yes [provider]  traMADol (ULTRAM) 50 MG tablet Take 1 tablet (50 mg total) by mouth daily as needed. Patient taking differently: Take 50 mg by mouth every 4 (four) hours.  10/15/17  Yes Kathrine Haddock, NP  vitamin B-12 (CYANOCOBALAMIN) 1000 MCG tablet Take 1,000 mcg by mouth daily.   Yes [provider]  alendronate (FOSAMAX) 70 MG tablet Take 70 mg by mouth once a week. 12/14/18 12/14/19  [provider]  chlorpheniramine-HYDROcodone (TUSSIONEX PENNKINETIC ER) 10-8 MG/5ML SUER Take 5 mLs by mouth every 12 (twelve) hours as needed. 06/21/19   Norval Gable, MD  Cholecalciferol (VITAMIN D-3) 1000 units CAPS Take 1,000 Units by mouth daily.    [provider]  doxycycline (VIBRA-TABS) 100 MG tablet Take 1 tablet (100 mg total) by mouth 2 (two) times daily. 06/21/19   Norval Gable, MD  leflunomide (ARAVA) 20 MG tablet Take 20 mg by mouth daily. 12/14/18 12/14/19  [provider]  mupirocin ointment (BACTROBAN) 2 % Apply 1 application topically 2 (two) times  daily. 04/29/19   Cannady, Jolene T, NP  amLODipine (NORVASC) 5 MG tablet TAKE ONE (1) TABLET BY MOUTH EACH DAY Patient taking differently: 5 mg. Take at bedtime 08/10/18   Marnee Guarneri T, NP    Family History Family History  Problem Relation Age of Onset  . Heart disease Mother        heart attack  . Arthritis Mother   . Arthritis Father     Social History Social History   Tobacco Use  . Smoking status: Former Smoker    Types: Cigarettes    Quit date: 1998    Years since quitting: 23.2  . Smokeless tobacco: Never Used  Substance Use Topics  . Alcohol use: No    Comment: stopped d/t cluster headaches  . Drug use: No     Allergies   Ativan [lorazepam] and Gold au 198 [gold]  Review of Systems Review of Systems  Respiratory: Positive for cough.   Musculoskeletal: Positive for back pain.     Physical Exam Triage Vital Signs ED Triage Vitals  Enc Vitals Group     BP 06/21/19 1929 137/78     Pulse Rate 06/21/19 1929 (!) 108     Resp 06/21/19 1929 18     Temp 06/21/19 1929 98.1 F (36.7 C)     Temp Source 06/21/19 1929 Oral     SpO2 06/21/19 1929 96 %     Weight 06/21/19 1925 161 lb (73 kg)     Height 06/21/19 1925 5\' 8"  (1.727 m)     Head Circumference --      Peak Flow --      Pain Score 06/21/19 1925 7     Pain Loc --      Pain Edu? --      Excl. in [place]? --    No data found.  Updated Vital Signs BP 137/78 (BP Location: Right Arm)   Pulse (!) 108   Temp 98.1 F (36.7 C) (Oral)   Resp 18   Ht 5\' 8"  (1.727 m)   Wt 73 kg   SpO2 96%   BMI 24.48 kg/m   Visual Acuity Right Eye Distance:   Left Eye Distance:   Bilateral Distance:    Right Eye Near:   Left Eye Near:    Bilateral Near:     Physical Exam Vitals and nursing note reviewed.  Constitutional:      General: He is not in acute distress.    Appearance: He is not toxic-appearing or diaphoretic.  Cardiovascular:     Rate and Rhythm: Normal rate.     Heart sounds: Normal heart sounds.   Pulmonary:     Effort: Pulmonary effort is normal. No respiratory distress.     Breath sounds: Normal breath sounds. No stridor. No wheezing, rhonchi or rales.  Chest:     Chest wall: No tenderness.  Musculoskeletal:     Comments: Mid back tenderness to palpation bilaterally  Neurological:     Mental Status: He is alert.      UC Treatments / Results  Labs (all labs ordered are listed, but only abnormal results are displayed) Labs Reviewed - No data to display  EKG   Radiology DG Chest 2 View  Addendum Date: 06/21/2019   ADDENDUM REPORT: 06/21/2019 20:19 ADDENDUM: The case was discussed on 06/21/2019 at 8:17 pm with provider Emonee Winkowski. There is a fracture of the posterior right 6 rib. Additionally, there are multiple left-sided rib fractures, involving the posterior aspects of the fourth, fifth and sixth ribs. There is an age-indeterminate compression fracture of a lower thoracic vertebrae. Electronically Signed   By: Ulyses Jarred M.D.   On: 06/21/2019 20:19   Result Date: 06/21/2019 CLINICAL DATA:  Cough EXAM: CHEST - 2 VIEW COMPARISON:  09/28/2013 FINDINGS: The heart size and mediastinal contours are within normal limits. Small hiatal hernia with fluid level. Both lungs are clear. The visualized skeletal structures are unremarkable. IMPRESSION: No active cardiopulmonary disease. Small hiatal hernia. Electronically Signed: By: Ulyses Jarred M.D. On: 06/21/2019 19:56    Procedures Procedures (including critical care time)  Medications Ordered in UC Medications - No data to display  Initial Impression / Assessment and Plan / UC Course  I have reviewed the triage vital signs and the nursing notes.  Pertinent labs & imaging results that were available during my care of the patient  were reviewed by me and considered in my medical decision making (see chart for details).      Final Clinical Impressions(s) / UC Diagnoses   Final diagnoses:  Cough  Multiple rib fractures  involving four or more ribs     Discharge Instructions     Tylenol as needed Follow up with Primary Care provider    ED Prescriptions    Medication Sig Dispense Auth. Provider   doxycycline (VIBRA-TABS) 100 MG tablet Take 1 tablet (100 mg total) by mouth 2 (two) times daily. 20 tablet Norval Gable, MD   chlorpheniramine-HYDROcodone [facility] ER) 10-8 MG/5ML SUER Take 5 mLs by mouth every 12 (twelve) hours as needed. 60 mL Norval Gable, MD      1. x-ray results and diagnosis reviewed with patient 2. rx as per orders above; reviewed possible side effects, interactions, risks and benefits  3. Recommend supportive treatment with rest, tylenol prn 4. Follow-up with PCP 5. Follow up prn if symptoms worsen or don't improve   I have reviewed the PDMP during this encounter.   Norval Gable, MD 06/21/19 2102

## 2019-06-21 NOTE — Telephone Encounter (Signed)
Called pt to see if he went to [facility] or ER to schedule f/u per Jolene, no answer, left vm.

## 2019-06-21 NOTE — Discharge Instructions (Signed)
Tylenol as needed Follow up with Primary Care provider

## 2019-06-21 NOTE — Telephone Encounter (Signed)
Noted, thank you

## 2019-06-21 NOTE — Telephone Encounter (Signed)
-----   Message from Venita Lick, NP sent at 06/17/2019 11:33 AM EDT ----- He is to follow-up after UC or ER visit

## 2019-06-23 ENCOUNTER — Telehealth: Payer: Self-pay | Admitting: Nurse Practitioner

## 2019-06-23 NOTE — Telephone Encounter (Signed)
Called pt to schedule appt, no answer, left vm to call us back

## 2019-06-23 NOTE — Telephone Encounter (Signed)
I think I have one opening Friday this week, can you schedule follow-up for that?  If not then next week will need to see him. Tell him I am glad he went.

## 2019-06-23 NOTE — Telephone Encounter (Signed)
Copied from [place] [id number]. Topic: General - Inquiry >> Jun 23, 2019  1:30 PM Mathis Bud wrote: Reason for CRM: Patient states he went to [facility] clinic and to let know PCP know that 5 cracked ribs in back.  Patient states he was advised to let PCP know.   Call back [phone]

## 2019-06-23 NOTE — Telephone Encounter (Signed)
Noted, thank you

## 2019-06-28 NOTE — Telephone Encounter (Signed)
Tried calling pt again, no answer, left vm

## 2019-06-28 NOTE — Telephone Encounter (Signed)
Noted, will follow-up with patient once he schedules follow-up.

## 2019-07-07 ENCOUNTER — Ambulatory Visit (INDEPENDENT_AMBULATORY_CARE_PROVIDER_SITE_OTHER): Payer: Medicare Other | Admitting: Nurse Practitioner

## 2019-07-07 ENCOUNTER — Encounter: Payer: Self-pay | Admitting: Nurse Practitioner

## 2019-07-07 VITALS — BP 129/79 | HR 91

## 2019-07-07 DIAGNOSIS — S22000A Wedge compression fracture of unspecified thoracic vertebra, initial encounter for closed fracture: Secondary | ICD-10-CM | POA: Insufficient documentation

## 2019-07-07 DIAGNOSIS — S2243XS Multiple fractures of ribs, bilateral, sequela: Secondary | ICD-10-CM | POA: Diagnosis not present

## 2019-07-07 DIAGNOSIS — S22000S Wedge compression fracture of unspecified thoracic vertebra, sequela: Secondary | ICD-10-CM

## 2019-07-07 DIAGNOSIS — M810 Age-related osteoporosis without current pathological fracture: Secondary | ICD-10-CM | POA: Insufficient documentation

## 2019-07-07 DIAGNOSIS — S2249XA Multiple fractures of ribs, unspecified side, initial encounter for closed fracture: Secondary | ICD-10-CM | POA: Insufficient documentation

## 2019-07-07 NOTE — Progress Notes (Signed)
BP 129/79   Pulse 91    Subjective:    Patient ID: Travis Haynes, male    DOB: 1945/07/04, 74 y.o.   MRN: [id number]  HPI: Travis Haynes is a 74 y.o. male  Chief Complaint  Patient presents with  . Results    pt wants to discuss and follow up previous x-ray results regarding multiple broken ribs    . This visit was completed via telephone due to the restrictions of the COVID-19 pandemic. All issues as above were discussed and addressed but no physical exam was performed. If it was felt that the patient should be evaluated in the office, they were directed there. The patient verbally consented to this visit. Patient was unable to complete an audio/visual visit due to Lack of equipment. Due to the catastrophic nature of the COVID-19 pandemic, this visit was done through audio contact only. . Location of the patient: home . Location of the provider: work . Those involved with this call:  . Provider: Marnee Guarneri, DNP . CMA: Yvonna Alanis, CMA . Front Desk/Registration: Don Perking  . Time spent on call: 15 minutes on the phone discussing health concerns. 10 minutes total spent in review of patient's record and preparation of their chart.  . I verified patient identity using two factors (patient name and date of birth). Patient consents verbally to being seen via telemedicine visit today.    RIB FRACTURES: On 06/21/19 he was diagnosed with rib fractures, posterior right 6th rib and multiple left-sided rib fractures, and age-determinate compression fracture of lower thoracic vertebrae at UC.  He was supposed to see RA specialist at [facility] last week, but missed appointment and needs to reschedule.  Sees orthopedic provider this Friday, has been followed by them for back pain for years.  Denies SOB, cough (this has improved), CP, or palpitations.  Does endorse continued back pain, taking Tramadol 50 MG every 4 hours, this is ordered by his RA specialist for his arthritis pain.  He  plans on talking to his orthopedic provider further about recent imaging.  States urgent care believes the fractures happened because of the cough he had at time, he denies any recent trauma or falls. At baseline has RA and is high risk for fractures due to age related osteoporosis, takes Fosamax, and chronic Prednisone use.  He is aware of this increased risk.  Declines physical therapy at this time. Visual changes: no Palpitations: no Dyspnea: no Chest pain: no Lower extremity edema: no -- reports this has been improving Dizzy/lightheaded: no  Relevant past medical, surgical, family and social history reviewed and updated as indicated. Interim medical history since our last visit reviewed. Allergies and medications reviewed and updated.  Review of Systems  Per HPI unless specifically indicated above     Objective:    BP 129/79   Pulse 91   Wt Readings from Last 3 Encounters:  06/21/19 161 lb (73 kg)  06/17/19 161 lb 12.8 oz (73.4 kg)  04/26/19 157 lb (71.2 kg)    Physical Exam   Unable to perform due to telephone visit only.  Results for orders placed or performed in visit on 04/05/19  CBC with Differential/Platelet out  Result Value Ref Range   WBC 8.6 3.4 - 10.8 x10E3/uL   RBC 4.13 (L) 4.14 - 5.80 x10E6/uL   Hemoglobin 13.4 13.0 - 17.7 g/dL   MCV 99 (H) 79 - 97 fL   MCH 32.4 26.6 - 33.0 pg   MCHC 32.7  31.5 - 35.7 g/dL   RDW 12.8 11.6 - 15.4 %   Platelets 238 150 - 450 x10E3/uL   Neutrophils 75 Not Estab. %   Lymphs 13 Not Estab. %   Monocytes 10 Not Estab. %   Eos 0 Not Estab. %   Basos 1 Not Estab. %   Neutrophils Absolute 6.6 1.4 - 7.0 x10E3/uL   Lymphocytes Absolute 1.1 0.7 - 3.1 x10E3/uL   Monocytes Absolute 0.8 0.1 - 0.9 x10E3/uL   EOS (ABSOLUTE) 0.0 0.0 - 0.4 x10E3/uL   Basophils Absolute 0.1 0.0 - 0.2 x10E3/uL   Immature Granulocytes 1 Not Estab. %   Immature Grans (Abs) 0.0 0.0 - 0.1 x10E3/uL   Hematology Comments: Note:   Comprehensive metabolic  panel  Result Value Ref Range   Glucose 114 (H) 65 - 99 mg/dL   BUN 19 8 - 27 mg/dL   Creatinine, Ser 1.10 0.76 - 1.27 mg/dL   GFR calc non Af Amer 67 >59 mL/min/1.73   GFR calc Af Amer 77 >59 mL/min/1.73   BUN/Creatinine Ratio 17 10 - 24   Sodium 140 134 - 144 mmol/L   Potassium 3.7 3.5 - 5.2 mmol/L   Chloride 97 96 - 106 mmol/L   CO2 29 20 - 29 mmol/L   Calcium 9.4 8.6 - 10.2 mg/dL   Total Protein 6.6 6.0 - 8.5 g/dL   Albumin 4.3 3.7 - 4.7 g/dL   Globulin, Total 2.3 1.5 - 4.5 g/dL   Albumin/Globulin Ratio 1.9 1.2 - 2.2   Bilirubin Total 0.4 0.0 - 1.2 mg/dL   Alkaline Phosphatase 83 39 - 117 IU/L   AST 24 0 - 40 IU/L   ALT 13 0 - 44 IU/L  Anemia panel  Result Value Ref Range   Total Iron Binding Capacity 380 250 - 450 ug/dL   UIBC 317 111 - 343 ug/dL   Iron 63 38 - 169 ug/dL   Iron Saturation 17 15 - 55 %   Vitamin B-12 913 232 - 1,245 pg/mL   Folate, Hemolysate 362.0 Not Estab. ng/mL   Hematocrit 41.0 37.5 - 51.0 %   Folate, RBC 883 >498 ng/mL   Ferritin 85 30 - 400 ng/mL   Retic Ct Pct 2.0 0.6 - 2.6 %      Assessment & Plan:   Problem List Items Addressed This Visit      Musculoskeletal and Integument   Compression fracture of thoracic vertebra (HCC) - Primary    Noted on 06/21/19, high risk for fractures due to underlying RA, chronic Prednisone use, and osteoporosis.  No recent trauma or falls, suspect related to cough he had at the time.  Has follow-up scheduled with orthopedics on Friday, recommend he keep follow-up visits and attend for further guidance and recommendations.  Continue Tramadol as needed for pain, this is currently prescribed by his RA provider at [facility].  He declines PT referral.  Return in 6 weeks.      Multiple rib fractures    Noted on 06/21/19 imaging, they are suspected to be related to cough he had at time.  Cough now improved.  High risk for fractures due to RA, chronic Prednisone use, and osteoporosis.  He denies any SOB or discomfort at ribs on  visit today.  His main complaint is back pain.  Recommend he continue deep breathing exercises at home, instructed on how to perform.  Continue use of Tramadol as needed.  Return to office in 6 weeks.  I discussed the assessment and treatment plan with the patient. The patient was provided an opportunity to ask questions and all were answered. The patient agreed with the plan and demonstrated an understanding of the instructions.   The patient was advised to call back or seek an in-person evaluation if the symptoms worsen or if the condition fails to improve as anticipated.   I provided 15+ minutes of time during this encounter.  Follow up plan: Return in about 6 weeks (around 08/18/2019) for In office follow-up chronic disease and rib/thoracic spine fractures.

## 2019-07-07 NOTE — Assessment & Plan Note (Signed)
Noted on 06/21/19 imaging, they are suspected to be related to cough he had at time.  Cough now improved.  High risk for fractures due to RA, chronic Prednisone use, and osteoporosis.  He denies any SOB or discomfort at ribs on visit today.  His main complaint is back pain.  Recommend he continue deep breathing exercises at home, instructed on how to perform.  Continue use of Tramadol as needed.  Return to office in 6 weeks.

## 2019-07-07 NOTE — Patient Instructions (Signed)
Rib Fracture  A rib fracture is a break or crack in one of the bones of the ribs. The ribs are like a cage that goes around your upper chest. A broken or cracked rib is often painful, but most do not cause other problems. Most rib fractures usually heal on their own in 1-3 months. Follow these instructions at home: Managing pain, stiffness, and swelling  If directed, apply ice to the injured area. ? Put ice in a plastic bag. ? Place a towel between your skin and the bag. ? Leave the ice on for 20 minutes, 2-3 times a day.  Take over-the-counter and prescription medicines only as told by your doctor. Activity  Avoid activities that cause pain to the injured area. Protect your injured area.  Slowly increase activity as told by your doctor. General instructions  Do deep breathing as told by your doctor. You may be told to: ? Take deep breaths many times a day. ? Cough many times a day while hugging a pillow. ? Use a device (incentive spirometer) to do deep breathing many times a day.  Drink enough fluid to keep your pee (urine) clear or pale yellow.  Do not wear a rib belt or binder. These do not allow you to breathe deeply.  Keep all follow-up visits as told by your doctor. This is important. Contact a doctor if:  You have a fever. Get help right away if:  You have trouble breathing.  You are short of breath.  You cannot stop coughing.  You cough up thick or bloody spit (sputum).  You feel sick to your stomach (nauseous), throw up (vomit), or have belly (abdominal) pain.  Your pain gets worse and medicine does not help. Summary  A rib fracture is a break or crack in one of the bones of the ribs.  Apply ice to the injured area and take medicines for pain as told by your doctor.  Take deep breaths and cough many times a day. Hug a pillow every time you cough. This information is not intended to replace advice given to you by your health care provider. Make sure you  discuss any questions you have with your health care provider. Document Revised: 02/21/2017 Document Reviewed: 06/11/2016 Elsevier Patient Education  2020 [employer] Inc[employer]  

## 2019-07-07 NOTE — Assessment & Plan Note (Signed)
Noted on 06/21/19, high risk for fractures due to underlying RA, chronic Prednisone use, and osteoporosis.  No recent trauma or falls, suspect related to cough he had at the time.  Has follow-up scheduled with orthopedics on Friday, recommend he keep follow-up visits and attend for further guidance and recommendations.  Continue Tramadol as needed for pain, this is currently prescribed by his RA provider at [facility]  He declines PT referral.  Return in 6 weeks.

## 2019-07-09 DIAGNOSIS — M545 Low back pain: Secondary | ICD-10-CM | POA: Diagnosis not present

## 2019-07-09 DIAGNOSIS — M5441 Lumbago with sciatica, right side: Secondary | ICD-10-CM | POA: Diagnosis not present

## 2019-07-09 DIAGNOSIS — M5416 Radiculopathy, lumbar region: Secondary | ICD-10-CM | POA: Diagnosis not present

## 2019-07-09 DIAGNOSIS — G8929 Other chronic pain: Secondary | ICD-10-CM | POA: Diagnosis not present

## 2019-07-09 DIAGNOSIS — M5136 Other intervertebral disc degeneration, lumbar region: Secondary | ICD-10-CM | POA: Diagnosis not present

## 2019-07-12 ENCOUNTER — Telehealth: Payer: Self-pay | Admitting: Nurse Practitioner

## 2019-07-12 NOTE — Telephone Encounter (Signed)
Called pt to schedule f/u Return in about 6 weeks (around 08/18/2019) in office, no answer, left vm

## 2019-07-12 NOTE — Telephone Encounter (Signed)
-----   Message from Venita Lick, NP sent at 07/07/2019  7:54 PM EDT ----- 6 week follow-up need

## 2019-07-19 NOTE — Telephone Encounter (Signed)
LVM for pt to call back to schedule appt

## 2019-07-26 ENCOUNTER — Encounter (INDEPENDENT_AMBULATORY_CARE_PROVIDER_SITE_OTHER): Payer: Self-pay | Admitting: Vascular Surgery

## 2019-07-26 ENCOUNTER — Ambulatory Visit (INDEPENDENT_AMBULATORY_CARE_PROVIDER_SITE_OTHER): Payer: Medicare Other

## 2019-07-26 ENCOUNTER — Ambulatory Visit (INDEPENDENT_AMBULATORY_CARE_PROVIDER_SITE_OTHER): Payer: Medicare Other | Admitting: Vascular Surgery

## 2019-07-26 ENCOUNTER — Other Ambulatory Visit: Payer: Self-pay

## 2019-07-26 VITALS — BP 97/63 | HR 118 | Resp 16

## 2019-07-26 DIAGNOSIS — I872 Venous insufficiency (chronic) (peripheral): Secondary | ICD-10-CM | POA: Diagnosis not present

## 2019-07-26 DIAGNOSIS — K219 Gastro-esophageal reflux disease without esophagitis: Secondary | ICD-10-CM | POA: Diagnosis not present

## 2019-07-26 DIAGNOSIS — I10 Essential (primary) hypertension: Secondary | ICD-10-CM | POA: Diagnosis not present

## 2019-07-26 DIAGNOSIS — I890 Lymphedema, not elsewhere classified: Secondary | ICD-10-CM

## 2019-07-28 ENCOUNTER — Encounter (INDEPENDENT_AMBULATORY_CARE_PROVIDER_SITE_OTHER): Payer: Self-pay | Admitting: Vascular Surgery

## 2019-07-28 NOTE — Telephone Encounter (Signed)
lvm to make this apt for in the office

## 2019-07-28 NOTE — Progress Notes (Signed)
MRN : [id number]  Travis Haynes is a 74 y.o. ([birth year]) male who presents with chief complaint of  Chief Complaint  Patient presents with  . Follow-up    ultraosund follow up  .  History of Present Illness:   The patient returns to the office for followup evaluation regarding leg swelling.  The swelling has improved quite a bit and the pain associated with swelling has decreased substantially. There have not been any interval development of a ulcerations or wounds.  Since the previous visit the patient has been wearing graduated compression stockings and has noted little significant improvement in the lymphedema. The patient has been using compression routinely morning until night.  The patient also states elevation during the day and exercise is being done too.   Current Meds  Medication Sig  . acetaminophen (TYLENOL) 500 MG tablet Take 1,000 mg by mouth every 6 (six) hours as needed for mild pain or moderate pain.   Marland Kitchen amLODipine (NORVASC) 5 MG tablet TAKE ONE (1) TABLET BY MOUTH EACH DAY (Patient taking differently: Take 5 mg by mouth every evening. )  . aspirin EC 81 MG tablet Take 1 tablet (81 mg total) by mouth daily.  Marland Kitchen atorvastatin (LIPITOR) 10 MG tablet TAKE 1 TABLET BY MOUTH EVERY DAY (Patient taking differently: Take 10 mg by mouth every evening. )  . Cholecalciferol (VITAMIN D-3) 1000 units CAPS Take 1,000 Units by mouth daily.  . furosemide (LASIX) 40 MG tablet Take 1 tablet (40 mg total) by mouth daily.  Marland Kitchen gabapentin (NEURONTIN) 100 MG capsule Take 100 mg by mouth at bedtime.  . naproxen sodium (ALEVE) 220 MG tablet Take 220-440 mg by mouth See admin instructions. Take 440 mg by mouth in the morning at 12pm. and 220 mg in the evening 12 AM  . omeprazole (PRILOSEC) 20 MG capsule TAKE 2 CAPSULES BY MOUTH EVERY DAY  . predniSONE (DELTASONE) 5 MG tablet Take 1.5 tablets by mouth daily.  . traMADol (ULTRAM) 50 MG tablet Take 1 tablet (50 mg total) by mouth daily as  needed. (Patient taking differently: Take 50 mg by mouth every 4 (four) hours. )  . vitamin B-12 (CYANOCOBALAMIN) 1000 MCG tablet Take 1,000 mcg by mouth daily.    Past Medical History:  Diagnosis Date  . Arthritis    rheumatoid arthritis, takes prednisone for this  . Barrett's esophagus   . Cancer ([place]) 1999   non hodgkins lymphoma  . Chronic back pain   . Chronic pain syndrome   . Claustrophobia   . Coronary artery disease   . GERD (gastroesophageal reflux disease)    barretts esophagus that requires dilation every couple years  . Hemorrhoids   . Hernia of abdominal cavity   . History of hiatal hernia   . Hyperlipidemia   . Hypertension   . Lumbago   . 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  . Weight loss 11/2017   30 lb weight loss over 3 years    Past Surgical History:  Procedure Laterality Date  . APPENDECTOMY  1954  . [place]   2 fusions then 1 surgery to fix the other two (lower)..plates and screws  . CATARACT EXTRACTION W/PHACO Left 12/31/2018   Procedure: CATARACT EXTRACTION PHACO AND INTRAOCULAR LENS PLACEMENT ([place]) LEFT VISION BLUE;  Surgeon: Marchia Meiers, MD;  Location: [facility];  Service: Ophthalmology;  Laterality: Left;  Lot [id number] H [place]: 01:51.7 CDE: 18.59  . CYSTECTOMY  from buttocks  . ESOPHAGEAL DILATION     multiple times  . ESOPHAGOGASTRODUODENOSCOPY (EGD) WITH PROPOFOL N/A 12/05/2014   Procedure: ESOPHAGOGASTRODUODENOSCOPY (EGD) WITH PROPOFOL;  Surgeon: Hulen Luster, MD;  Location: [facility];  Service: Gastroenterology;  Laterality: N/A;  . EXCISION PARTIAL PHALANX Right 12/09/2017   Procedure: EXCISION PARTIAL PHALANX-GREAT TOE;  Surgeon: Sharlotte Alamo, DPM;  Location: [facility];  Service: Podiatry;  Laterality: Right;  . FOOT SURGERY    . intestine repair  1998   during biopsy his intestines were nicked requiring repair  . [place]   fused 2 vertebrae in neck (plate plus screws)  . RADIOLOGY WITH  ANESTHESIA N/A 03/09/2019   Procedure: MRI WITH ANESTHESIA LUMBAR WITHOUT CONTRAST;  Surgeon: Radiologist, Medication, MD;  Location: [facility];  Service: Radiology;  Laterality: N/A;  . Reduction masseter muscle/bone extraoral      Social History Social History   Tobacco Use  . Smoking status: Former Smoker    Types: Cigarettes    Quit date: 1998    Years since quitting: 23.3  . Smokeless tobacco: Never Used  Substance Use Topics  . Alcohol use: No    Comment: stopped d/t cluster headaches  . Drug use: No    Family History Family History  Problem Relation Age of Onset  . Heart disease Mother        heart attack  . Arthritis Mother   . Arthritis Father     Allergies  Allergen Reactions  . Ativan [Lorazepam] Other (See Comments)    Pt states medication made him hyper  . Gold Au 198 [Gold] Other (See Comments)    Pt states platelet count was low. This was in 10.     REVIEW OF SYSTEMS (Negative unless checked)  Constitutional: [] Weight loss  [] Fever  [] Chills Cardiac: [] Chest pain   [] Chest pressure   [] Palpitations   [] Shortness of breath when laying flat   [] Shortness of breath with exertion. Vascular:  [] Pain in legs with walking   [] Pain in legs at rest  [] History of DVT   [] Phlebitis   [x] Swelling in legs   [] Varicose veins   [] Non-healing ulcers Pulmonary:   [] Uses home oxygen   [] Productive cough   [] Hemoptysis   [] Wheeze  [] COPD   [] Asthma Neurologic:  [] Dizziness   [] Seizures   [] History of stroke   [] History of TIA  [] Aphasia   [] Vissual changes   [] Weakness or numbness in arm   [] Weakness or numbness in leg Musculoskeletal:   [x] Joint swelling   [x] Joint pain   [] Low back pain Hematologic:  [] Easy bruising  [] Easy bleeding   [] Hypercoagulable state   [] Anemic Gastrointestinal:  [] Diarrhea   [] Vomiting  [] Gastroesophageal reflux/heartburn   [] Difficulty swallowing. Genitourinary:  [] Chronic kidney disease   [] Difficult urination  [] Frequent urination   [] Blood in  urine Skin:  [x] Rashes   [] Ulcers  Psychological:  [] History of anxiety   []  History of major depression.  Physical Examination  Vitals:   07/26/19 1602  BP: 97/63  Pulse: (!) 118  Resp: 16   There is no height or weight on file to calculate BMI. Gen: WD/WN, NAD Head: [place]/AT, No temporalis wasting.  Ear/Nose/Throat: Hearing grossly intact, nares w/o erythema or drainage Eyes: PER, EOMI, sclera nonicteric.  Neck: Supple, no large masses.   Pulmonary:  Good air movement, no audible wheezing bilaterally, no use of accessory muscles.  Cardiac: RRR, no JVD Vascular: scattered varicosities present bilaterally.  Mild venous stasis changes to  the legs bilaterally.  2-3+ soft pitting edema Vessel Right Left  Radial Palpable Palpable  Gastrointestinal: Non-distended. No guarding/no peritoneal signs.  Musculoskeletal: M/S 5/5 throughout.  Multiple joint deformity no atrophy.  Neurologic: CN 2-12 intact. Symmetrical.  Speech is fluent. Motor exam as listed above. Psychiatric: Judgment intact, Mood & affect appropriate for pt's clinical situation. Dermatologic: Mild venous rashes no ulcers noted.  No changes consistent with cellulitis. Lymph : No lichenification or skin changes of chronic lymphedema.  CBC Lab Results  Component Value Date   WBC 8.6 04/05/2019   HGB 13.4 04/05/2019   HCT 41.0 04/05/2019   MCV 99 (H) 04/05/2019   PLT 238 04/05/2019    BMET    Component Value Date/Time   NA 140 04/05/2019 1645   K 3.7 04/05/2019 1645   CL 97 04/05/2019 1645   CO2 29 04/05/2019 1645   GLUCOSE 114 (H) 04/05/2019 1645   BUN 19 04/05/2019 1645   CREATININE 1.10 04/05/2019 1645   CALCIUM 9.4 04/05/2019 1645   GFRNONAA 67 04/05/2019 1645   GFRAA 77 04/05/2019 1645   CrCl cannot be calculated (Patient's most recent lab result is older than the maximum 21 days allowed.).  COAG Lab Results  Component Value Date   INR 1.0 07/28/2017    Radiology No results  found.   Assessment/Plan 1. Lymphedema No surgery or intervention at this point in time.  I have reviewed my discussion with the patient regarding venous insufficiency and why it causes symptoms. I have discussed with the patient the chronic skin changes that accompany venous insufficiency and the long term sequela such as ulceration. Patient will contnue wearing graduated compression stockings on a daily basis, as this has provided excellent control of his edema. The patient will put the stockings on first thing in the morning and removing them in the evening. The patient is reminded not to sleep in the stockings.  In addition, behavioral modification including elevation during the day will be initiated. Exercise is strongly encouraged.  Duplex ultrasound of the bilateral lower extremity shows widely patent deep system bilaterally no reflux.  Given the patient's good control and lack of any problems regarding the venous insufficiency and lymphedema a lymph pump in not need at this time.  The patient will follow up with me PRN should anything change.  The patient voices agreement with this plan.   2. Chronic venous insufficiency No surgery or intervention at this point in time.  I have reviewed my discussion with the patient regarding venous insufficiency and why it causes symptoms. I have discussed with the patient the chronic skin changes that accompany venous insufficiency and the long term sequela such as ulceration. Patient will contnue wearing graduated compression stockings on a daily basis, as this has provided excellent control of his edema. The patient will put the stockings on first thing in the morning and removing them in the evening. The patient is reminded not to sleep in the stockings.  In addition, behavioral modification including elevation during the day will be initiated. Exercise is strongly encouraged.  Duplex ultrasound of the bilateral lower extremity shows widely  patent deep system bilaterally no reflux.  Given the patient's good control and lack of any problems regarding the venous insufficiency and lymphedema a lymph pump in not need at this time.  The patient will follow up with me PRN should anything change.  The patient voices agreement with this plan.   3. Essential hypertension Continue antihypertensive medications as already ordered,  these medications have been reviewed and there are no changes at this time.   4. Gastroesophageal reflux disease, unspecified whether esophagitis present Continue PPI as already ordered, this medication has been reviewed and there are no changes at this time.  Avoidence of caffeine and alcohol  Moderate elevation of the head of the bed    Hortencia Pilar, MD  07/28/2019 8:59 AM

## 2019-07-28 NOTE — Telephone Encounter (Signed)
lvm to make this apt in the office per provider.

## 2019-07-28 NOTE — Telephone Encounter (Signed)
-----   Message from Venita Lick, NP sent at 07/27/2019  7:56 PM EDT ----- He is scheduled for virtual Thursday, please see if he can reschedule to office please as difficult to assess leg edema via telephone only.  Thank you.

## 2019-07-29 ENCOUNTER — Ambulatory Visit (INDEPENDENT_AMBULATORY_CARE_PROVIDER_SITE_OTHER): Payer: Medicare Other | Admitting: Nurse Practitioner

## 2019-07-29 ENCOUNTER — Other Ambulatory Visit: Payer: Self-pay

## 2019-07-29 ENCOUNTER — Encounter: Payer: Self-pay | Admitting: Nurse Practitioner

## 2019-07-29 DIAGNOSIS — I890 Lymphedema, not elsewhere classified: Secondary | ICD-10-CM | POA: Diagnosis not present

## 2019-07-29 MED ORDER — FUROSEMIDE 40 MG PO TABS
40.0000 mg | ORAL_TABLET | Freq: Every day | ORAL | 3 refills | Status: DC | PRN
Start: 2019-07-29 — End: 2019-08-27

## 2019-07-29 NOTE — Progress Notes (Signed)
BP 115/78 (BP Location: Left Wrist, Patient Position: Sitting, Cuff Size: Normal)   Pulse 94   Temp 97.7 F (36.5 C) (Oral)   Ht 5\' 8"  (1.727 m)   Wt 149 lb (67.6 kg)   SpO2 96%   BMI 22.66 kg/m    Subjective:    Patient ID: Travis Haynes, male    DOB: 1945/08/24, 74 y.o.   MRN: [id number]  HPI: Travis Haynes is a 74 y.o. male  Chief Complaint  Patient presents with  . Edema    Swelling in feet that patient had is a lot better. Still has some swelling, but is a lot better. Patient wants to know the next step and should he stop taking Lasix.     . This visit was completed via telephone due to the restrictions of the COVID-19 pandemic. All issues as above were discussed and addressed but no physical exam was performed. If it was felt that the patient should be evaluated in the office, they were directed there. The patient verbally consented to this visit. Patient was unable to complete an audio/visual visit due to Lack of equipment. Due to the catastrophic nature of the COVID-19 pandemic, this visit was done through audio contact only. . Location of the patient: home . Location of the provider: home . Those involved with this call:  . Provider: Marnee Guarneri, DNP . CMA: Yvonna Alanis, CMA . Front Desk/Registration: Don Perking  . Time spent on call: 15 minutes on the phone discussing health concerns. 10 minutes total spent in review of patient's record and preparation of their chart.  . I verified patient identity using two factors (patient name and date of birth). Patient consents verbally to being seen via telemedicine visit today.   EDEMA BILATERAL LEGS: He had visit with Dr. Delana Meyer last on 07/26/2019 and on report edema is improving.  Patient endorses he has not been wearing compression hose, states he went to get them and swelling was getting better, so he did not get them.  Duplex u/s showed widely patent venous system bilaterally and no reflux.  Chronic venous  insufficiency and lymphedema diagnosis.  He reports edema is a whole lot better then it was, feels he can sit up longer than he did without edema presenting.  Has a history of non-Hodgkin's lymphoma treated with chemo in 1998. He denies fever, weight loss, or night sweats at this time.  Denies pain to lower extremities, other than his arthritis pain which is consistent and unchanged.Denies any pain, redness, warmth, or tenderness to bilateral legs. Paresthesias:no Decreased sensation:no Weakness:no Insomnia:no Fatigue:no Alleviating factors:placing legs up Aggravating factors:being up and moving Status:fluctuating  Relevant past medical, surgical, family and social history reviewed and updated as indicated. Interim medical history since our last visit reviewed. Allergies and medications reviewed and updated.  Review of Systems  Constitutional: Negative for activity change, diaphoresis, fatigue and fever.  Respiratory: Negative for cough, chest tightness, shortness of breath and wheezing.   Cardiovascular: Positive for leg swelling. Negative for chest pain and palpitations.  Gastrointestinal: Negative.   Neurological: Negative.   Psychiatric/Behavioral: Negative.     Per HPI unless specifically indicated above     Objective:    BP 115/78 (BP Location: Left Wrist, Patient Position: Sitting, Cuff Size: Normal)   Pulse 94   Temp 97.7 F (36.5 C) (Oral)   Ht 5\' 8"  (1.727 m)   Wt 149 lb (67.6 kg)   SpO2 96%   BMI 22.66 kg/m  Wt Readings from Last 3 Encounters:  07/29/19 149 lb (67.6 kg)  06/21/19 161 lb (73 kg)  06/17/19 161 lb 12.8 oz (73.4 kg)    Physical Exam   Unable to perform due to telephone visit only  Results for orders placed or performed in visit on 04/05/19  CBC with Differential/Platelet out  Result Value Ref Range   WBC 8.6 3.4 - 10.8 x10E3/uL   RBC 4.13 (L) 4.14 - 5.80 x10E6/uL   Hemoglobin 13.4 13.0 - 17.7 g/dL   MCV 99 (H) 79 -  97 fL   MCH 32.4 26.6 - 33.0 pg   MCHC 32.7 31.5 - 35.7 g/dL   RDW 12.8 11.6 - 15.4 %   Platelets 238 150 - 450 x10E3/uL   Neutrophils 75 Not Estab. %   Lymphs 13 Not Estab. %   Monocytes 10 Not Estab. %   Eos 0 Not Estab. %   Basos 1 Not Estab. %   Neutrophils Absolute 6.6 1.4 - 7.0 x10E3/uL   Lymphocytes Absolute 1.1 0.7 - 3.1 x10E3/uL   Monocytes Absolute 0.8 0.1 - 0.9 x10E3/uL   EOS (ABSOLUTE) 0.0 0.0 - 0.4 x10E3/uL   Basophils Absolute 0.1 0.0 - 0.2 x10E3/uL   Immature Granulocytes 1 Not Estab. %   Immature Grans (Abs) 0.0 0.0 - 0.1 x10E3/uL   Hematology Comments: Note:   Comprehensive metabolic panel  Result Value Ref Range   Glucose 114 (H) 65 - 99 mg/dL   BUN 19 8 - 27 mg/dL   Creatinine, Ser 1.10 0.76 - 1.27 mg/dL   GFR calc non Af Amer 67 >59 mL/min/1.73   GFR calc Af Amer 77 >59 mL/min/1.73   BUN/Creatinine Ratio 17 10 - 24   Sodium 140 134 - 144 mmol/L   Potassium 3.7 3.5 - 5.2 mmol/L   Chloride 97 96 - 106 mmol/L   CO2 29 20 - 29 mmol/L   Calcium 9.4 8.6 - 10.2 mg/dL   Total Protein 6.6 6.0 - 8.5 g/dL   Albumin 4.3 3.7 - 4.7 g/dL   Globulin, Total 2.3 1.5 - 4.5 g/dL   Albumin/Globulin Ratio 1.9 1.2 - 2.2   Bilirubin Total 0.4 0.0 - 1.2 mg/dL   Alkaline Phosphatase 83 39 - 117 IU/L   AST 24 0 - 40 IU/L   ALT 13 0 - 44 IU/L  Anemia panel  Result Value Ref Range   Total Iron Binding Capacity 380 250 - 450 ug/dL   UIBC 317 111 - 343 ug/dL   Iron 63 38 - 169 ug/dL   Iron Saturation 17 15 - 55 %   Vitamin B-12 913 232 - 1,245 pg/mL   Folate, Hemolysate 362.0 Not Estab. ng/mL   Hematocrit 41.0 37.5 - 51.0 %   Folate, RBC 883 >498 ng/mL   Ferritin 85 30 - 400 ng/mL   Retic Ct Pct 2.0 0.6 - 2.6 %      Assessment & Plan:   Problem List Items Addressed This Visit      Other   Lymphedema    Chronic and improving.  Recommend that he purchase compression hose and wear these consistently during daytime, off at night + continue elevation intermittent of legs  during daytime.  May change to taking Lasix daily as needed only, script changed.  Continue to collaborate with vascular as needed.  Follow-up for CPE when able to schedule, he has to check with son's schedule first, sooner if any worsening of symptoms.  I discussed the assessment and treatment plan with the patient. The patient was provided an opportunity to ask questions and all were answered. The patient agreed with the plan and demonstrated an understanding of the instructions.   The patient was advised to call back or seek an in-person evaluation if the symptoms worsen or if the condition fails to improve as anticipated.   I provided 15+ minutes of time during this encounter.  Follow up plan: Return for for CPE upcoming -- patient to call and schedule.

## 2019-07-29 NOTE — Patient Instructions (Signed)
Edema  Edema is when you have too much fluid in your body or under your skin. Edema may make your legs, feet, and ankles swell up. Swelling is also common in looser tissues, like around your eyes. This is a common condition. It gets more common as you get older. There are many possible causes of edema. Eating too much salt (sodium) and being on your feet or sitting for a long time can cause edema in your legs, feet, and ankles. Hot weather may make edema worse. Edema is usually painless. Your skin may look swollen or shiny. Follow these instructions at home:  Keep the swollen body part raised (elevated) above the level of your heart when you are sitting or lying down.  Do not sit still or stand for a long time.  Do not wear tight clothes. Do not wear garters on your upper legs.  Exercise your legs. This can help the swelling go down.  Wear elastic bandages or support stockings as told by your doctor.  Eat a low-salt (low-sodium) diet to reduce fluid as told by your doctor.  Depending on the cause of your swelling, you may need to limit how much fluid you drink (fluid restriction).  Take over-the-counter and prescription medicines only as told by your doctor. Contact a doctor if:  Treatment is not working.  You have heart, liver, or kidney disease and have symptoms of edema.  You have sudden and unexplained weight gain. Get help right away if:  You have shortness of breath or chest pain.  You cannot breathe when you lie down.  You have pain, redness, or warmth in the swollen areas.  You have heart, liver, or kidney disease and get edema all of a sudden.  You have a fever and your symptoms get worse all of a sudden. Summary  Edema is when you have too much fluid in your body or under your skin.  Edema may make your legs, feet, and ankles swell up. Swelling is also common in looser tissues, like around your eyes.  Raise (elevate) the swollen body part above the level of your  heart when you are sitting or lying down.  Follow your doctor's instructions about diet and how much fluid you can drink (fluid restriction). This information is not intended to replace advice given to you by your health care provider. Make sure you discuss any questions you have with your health care provider. Document Revised: 03/14/2017 Document Reviewed: 03/29/2016 Elsevier Patient Education  2020 [employer] Inc[employer]  

## 2019-07-29 NOTE — Assessment & Plan Note (Addendum)
Chronic and improving.  Recommend that he purchase compression hose and wear these consistently during daytime, off at night + continue elevation intermittent of legs during daytime.  May change to taking Lasix daily as needed only, script changed.  Continue to collaborate with vascular as needed.  Follow-up for CPE when able to schedule, he has to check with son's schedule first, sooner if any worsening of symptoms.

## 2019-08-05 ENCOUNTER — Other Ambulatory Visit: Payer: Self-pay | Admitting: Nurse Practitioner

## 2019-08-27 ENCOUNTER — Other Ambulatory Visit: Payer: Self-pay | Admitting: Family Medicine

## 2019-09-07 ENCOUNTER — Ambulatory Visit (INDEPENDENT_AMBULATORY_CARE_PROVIDER_SITE_OTHER): Payer: Medicare Other | Admitting: Nurse Practitioner

## 2019-09-07 ENCOUNTER — Other Ambulatory Visit: Payer: Self-pay

## 2019-09-07 ENCOUNTER — Encounter: Payer: Self-pay | Admitting: Nurse Practitioner

## 2019-09-07 VITALS — BP 119/78 | HR 88 | Temp 98.0°F | Wt 139.9 lb

## 2019-09-07 DIAGNOSIS — R9431 Abnormal electrocardiogram [ECG] [EKG]: Secondary | ICD-10-CM

## 2019-09-07 DIAGNOSIS — Z01818 Encounter for other preprocedural examination: Secondary | ICD-10-CM

## 2019-09-07 NOTE — Progress Notes (Signed)
BP 119/78    Pulse 88 Comment: apical   Temp 98 F (36.7 C) (Oral)    Wt 139 lb 14.4 oz (63.5 kg)    SpO2 97%    BMI 21.27 kg/m    Subjective:    Patient ID: Travis Haynes, male    DOB: [birth year], 74 y.o.   MRN: [id number]  HPI: Travis Haynes is a 74 y.o. male  Chief Complaint  Patient presents with   Surgery Clearance   PRE-OP CLEARANCE: Presents today for clearance for anesthesia for MRI -- .  Does not have clearance form with him today.  Reports need for EKG, which he states he has required before.  Patient unable to lie down for procedure due to rheumatoid arthritis and discomfort with this on exam table, EKG performed sitting up in chair. Continues to be followed by rheumatology at [facility], is scheduled end of month for follow-up.      Relevant past medical, surgical, family and social history reviewed and updated as indicated. Interim medical history since our last visit reviewed. Allergies and medications reviewed and updated.  Review of Systems  Constitutional: Negative for activity change, diaphoresis, fatigue and fever.  Respiratory: Negative for cough, chest tightness, shortness of breath and wheezing.   Cardiovascular: Negative for chest pain, palpitations and leg swelling.  Gastrointestinal: Negative.   Musculoskeletal: Positive for arthralgias.  Neurological: Negative.   Psychiatric/Behavioral: Negative.     Per HPI unless specifically indicated above     Objective:    BP 119/78    Pulse 88 Comment: apical   Temp 98 F (36.7 C) (Oral)    Wt 139 lb 14.4 oz (63.5 kg)    SpO2 97%    BMI 21.27 kg/m   Wt Readings from Last 3 Encounters:  09/07/19 139 lb 14.4 oz (63.5 kg)  07/29/19 149 lb (67.6 kg)  06/21/19 161 lb (73 kg)    Physical Exam Vitals and nursing note reviewed.  Constitutional:      General: He is awake. He is not in acute distress.    Appearance: He is well-developed and well-groomed. He is not ill-appearing.  HENT:     Head: Normocephalic and  atraumatic.     Right Ear: Hearing normal. No drainage.     Left Ear: Hearing normal. No drainage.  Eyes:     General: Lids are normal.        Right eye: No discharge.        Left eye: No discharge.     Conjunctiva/sclera: Conjunctivae normal.     Pupils: Pupils are equal, round, and reactive to light.  Neck:     Thyroid: No thyromegaly.     Vascular: No carotid bruit.     Trachea: Trachea normal.  Cardiovascular:     Rate and Rhythm: Normal rate and regular rhythm.     Heart sounds: Normal heart sounds, S1 normal and S2 normal. No murmur heard.  No gallop.      Comments: No further edema to BLE, much improved from previous visits. Pulmonary:     Effort: Pulmonary effort is normal. No accessory muscle usage or respiratory distress.     Breath sounds: Normal breath sounds.  Abdominal:     General: Bowel sounds are normal.     Palpations: Abdomen is soft.  Musculoskeletal:        General: Normal range of motion.     Cervical back: Normal range of motion and neck supple.  Right lower leg: No edema.     Left lower leg: No edema.  Skin:    General: Skin is warm and dry.     Capillary Refill: Capillary refill takes less than 2 seconds.  Neurological:     Mental Status: He is alert and oriented to person, place, and time.  Psychiatric:        Attention and Perception: Attention normal.        Mood and Affect: Mood normal.        Speech: Speech normal.        Behavior: Behavior normal. Behavior is cooperative.        Thought Content: Thought content normal.    EKG x 3 in office today -- difficult EKG due to patient position and arthralgias due to his RA.   Initial 2 EKG performed with patient sitting up in chair noting rate 106, sinus tachycardia with left axis deviation and possible old anteroseptal infarct. Third EKG performed with patient lying on exam table, with assist, head slightly elevated for comfort -- Noting rate 96, NSR, left axis deviation and possible old  anteroseptal infarct.   Results for orders placed or performed in visit on 04/05/19  CBC with Differential/Platelet out  Result Value Ref Range   WBC 8.6 3.4 - 10.8 x10E3/uL   RBC 4.13 (L) 4.14 - 5.80 x10E6/uL   Hemoglobin 13.4 13.0 - 17.7 g/dL   MCV 99 (H) 79 - 97 fL   MCH 32.4 26.6 - 33.0 pg   MCHC 32.7 31 - 35 g/dL   RDW 12.8 11.6 - 15.4 %   Platelets 238 150 - 450 x10E3/uL   Neutrophils 75 Not Estab. %   Lymphs 13 Not Estab. %   Monocytes 10 Not Estab. %   Eos 0 Not Estab. %   Basos 1 Not Estab. %   Neutrophils Absolute 6.6 1 - 7 x10E3/uL   Lymphocytes Absolute 1.1 0 - 3 x10E3/uL   Monocytes Absolute 0.8 0 - 0 x10E3/uL   EOS (ABSOLUTE) 0.0 0.0 - 0.4 x10E3/uL   Basophils Absolute 0.1 0 - 0 x10E3/uL   Immature Granulocytes 1 Not Estab. %   Immature Grans (Abs) 0.0 0.0 - 0.1 x10E3/uL   Hematology Comments: Note:   Comprehensive metabolic panel  Result Value Ref Range   Glucose 114 (H) 65 - 99 mg/dL   BUN 19 8 - 27 mg/dL   Creatinine, Ser 1.10 0.76 - 1.27 mg/dL   GFR calc non Af Amer 67 >59 mL/min/1.73   GFR calc Af Amer 77 >59 mL/min/1.73   BUN/Creatinine Ratio 17 10 - 24   Sodium 140 134 - 144 mmol/L   Potassium 3.7 3.5 - 5.2 mmol/L   Chloride 97 96 - 106 mmol/L   CO2 29 20 - 29 mmol/L   Calcium 9.4 8.6 - 10.2 mg/dL   Total Protein 6.6 6.0 - 8.5 g/dL   Albumin 4.3 3.7 - 4.7 g/dL   Globulin, Total 2.3 1.5 - 4.5 g/dL   Albumin/Globulin Ratio 1.9 1.2 - 2.2   Bilirubin Total 0.4 0.0 - 1.2 mg/dL   Alkaline Phosphatase 83 39 - 117 IU/L   AST 24 0 - 40 IU/L   ALT 13 0 - 44 IU/L  Anemia panel  Result Value Ref Range   Total Iron Binding Capacity 380 250 - 450 ug/dL   UIBC 317 111 - 343 ug/dL   Iron 63 38 - 169 ug/dL   Iron Saturation 17 15 - 55 %  Vitamin B-12 913 232 - 1,245 pg/mL   Folate, Hemolysate 362.0 Not Estab. ng/mL   Hematocrit 41.0 37.5 - 51.0 %   Folate, RBC 883 >498 ng/mL   Ferritin 85 30.0 - 400.0 ng/mL   Retic Ct Pct 2.0 0.6 - 2.6 %        Assessment & Plan:   Problem List Items Addressed This Visit    None    Visit Diagnoses    Preoperative clearance    -  Primary   Urgent referral to cardiology placed for further cardiac assessment and work-up due to abnormal EKG.     Relevant Orders   EKG 12-Lead (Completed)   EKG 12-Lead (Completed)   EKG 12-Lead (Completed)   Abnormal EKG       Referral to cardiology placed due to abnormal EKG x 3 in office today, difficult EKG due to patient comfort and position.  Asymptomatic.   Relevant Orders   Ambulatory referral to Cardiology       Follow up plan: Return if symptoms worsen or fail to improve.

## 2019-09-07 NOTE — Patient Instructions (Signed)
Electrocardiogram  An electrocardiogram (ECG or EKG) is a test to check your heart. The test is simple and safe, and it does not hurt. It may be done as a part of a physical exam. It may also be done to check out symptoms like chest pain or a fast or irregular heartbeat (palpitations). Tell a health care provider about:  Any allergies you have. What are the risks? There are no risks. What happens before the procedure? There is nothing you need to do to prepare. What happens during the procedure?  You will take off your clothes from the waist up.  You will lie on your back.  Hair may be removed from your chest, arms, and legs.  Sticky patches (electrodes) will be placed on your chest, arms, and legs.  Wires (leads) will be attached to the sticky patches and to a machine.  You will be asked to relax and lie still while the machine checks your heart. The procedure may vary among doctors and hospitals. What happens after the procedure?  Your test results will be looked at by a doctor.  It is up to you to get your test results. Ask your doctor, or the department that is doing the test, when your results will be ready. Summary  An electrocardiogram (ECG or EKG) is a test to check your heart.  The test is simple and safe, and it does not hurt. There are no risks of having this test. You do not need to prepare for the test.  Sticky patches (electrodes) will be placed on your chest, arms, and legs. Wires (leads) will be attached to the sticky patches and to a machine. While you lie still on your back, the machine will check your heart. This information is not intended to replace advice given to you by your health care provider. Make sure you discuss any questions you have with your health care provider. Document Revised: 03/27/2016 Document Reviewed: 03/27/2016 Elsevier Patient Education  [place].

## 2019-09-13 ENCOUNTER — Other Ambulatory Visit: Payer: Self-pay | Admitting: Nurse Practitioner

## 2019-09-13 DIAGNOSIS — I208 Other forms of angina pectoris: Secondary | ICD-10-CM | POA: Diagnosis not present

## 2019-09-13 DIAGNOSIS — R0602 Shortness of breath: Secondary | ICD-10-CM | POA: Diagnosis not present

## 2019-09-13 DIAGNOSIS — I25119 Atherosclerotic heart disease of native coronary artery with unspecified angina pectoris: Secondary | ICD-10-CM | POA: Diagnosis not present

## 2019-09-13 DIAGNOSIS — I10 Essential (primary) hypertension: Secondary | ICD-10-CM | POA: Diagnosis not present

## 2019-09-13 DIAGNOSIS — R9431 Abnormal electrocardiogram [ECG] [EKG]: Secondary | ICD-10-CM | POA: Diagnosis not present

## 2019-09-14 ENCOUNTER — Other Ambulatory Visit: Payer: Self-pay | Admitting: Internal Medicine

## 2019-09-14 DIAGNOSIS — R0602 Shortness of breath: Secondary | ICD-10-CM

## 2019-09-20 ENCOUNTER — Other Ambulatory Visit
Admission: RE | Admit: 2019-09-20 | Discharge: 2019-09-20 | Disposition: A | Discharge status: Home / Self Care | Payer: Medicare Other | Source: Ambulatory Visit | Attending: Family Medicine | Admitting: Family Medicine

## 2019-09-20 ENCOUNTER — Other Ambulatory Visit: Payer: Self-pay

## 2019-09-20 DIAGNOSIS — Z01812 Encounter for preprocedural laboratory examination: Secondary | ICD-10-CM | POA: Diagnosis not present

## 2019-09-20 DIAGNOSIS — Z20822 Contact with and (suspected) exposure to covid-19: Secondary | ICD-10-CM | POA: Diagnosis not present

## 2019-09-20 LAB — SARS CORONAVIRUS 2 (TAT 6-24 HRS): SARS Coronavirus 2: NEGATIVE

## 2019-09-21 ENCOUNTER — Telehealth: Payer: Self-pay

## 2019-09-21 NOTE — Telephone Encounter (Signed)
I did see note and their is a letter they sent in the basket, I signed it.  He did not have clearance form with him.  I think you had called after we had performed EKG because he thought he needed that for MRI and we found he did not need anything.  He never came with a form.  He is cleared for MRI via cardiology though.

## 2019-09-21 NOTE — Telephone Encounter (Signed)
Pt called wanting to know if Jolene has received information regarding his EKG and having a MRI and be put to sleep.  CB#   [phone]

## 2019-09-21 NOTE — Telephone Encounter (Signed)
Called over to [facility] to let them know that patient has been cleared from [place] and from cardio for MRI on 09/23/19. They do not need any sort of form or anything but would like a letter stating that patient is cleared. Fax to [phone], with attn Whitney.   Letter typed and ready for signature. Will fax as soon as it is signed.   Called and let patient know that this was being done.

## 2019-09-21 NOTE — Telephone Encounter (Signed)
Has anyone seen this? Copied from [place] [phone]. Topic: General - Call Back - No Documentation >> Sep 20, 2019  2:43 PM Jaynie Collins D wrote: Reason for CRM: Patient wanted to know if PCP has received fax from heart doctor advising that it is ok to have and EKG done, and if so has PCP sent fax over to the correct persons. Patient would like call back with resolution >> Sep 21, 2019  9:49 AM Don Perking M wrote: Forwarding to confirm receipt of form.

## 2019-09-21 NOTE — Telephone Encounter (Signed)
Letter faxed to [facility].

## 2019-09-21 NOTE — Telephone Encounter (Signed)
Jolene we have not received a fax from Cardiology. Are you able to review Dr. Etta Quill note in the chart? Do we still have his clearance form?

## 2019-09-22 ENCOUNTER — Other Ambulatory Visit: Payer: Self-pay

## 2019-09-22 ENCOUNTER — Encounter (HOSPITAL_COMMUNITY): Payer: Self-pay | Admitting: *Deleted

## 2019-09-22 NOTE — Progress Notes (Signed)
Pt denies SOB and chest pain. Pt stated that he is under the care of Dr. Clayborn Bigness, Cardiology and Katherine Mantle, NP, PCP.  Pt denies having a cardiac cath.  Nurse requested EKG tracing, stress test, echo and cardiac clearance note from Dr. Clayborn Bigness; awaiting response. Pt denies recent labs. Pt made aware to stop taking vitamins and herbal medications. Pt reminded to quarantine. Pt verbalized understanding of all pre-op instructions. PA, Anesthesiology, asked to review pt history.

## 2019-09-22 NOTE — Anesthesia Preprocedure Evaluation (Addendum)
Anesthesia Evaluation  Patient identified by MRN, date of birth, ID band Patient awake    Reviewed: Allergy & Precautions, NPO status , Patient's Chart, lab work & pertinent test results  Airway Mallampati: II  TM Distance: >3 FB Neck ROM: Full    Dental  (+) Dental Advisory Given   Pulmonary former smoker,    breath sounds clear to auscultation       Cardiovascular hypertension, Pt. on medications + CAD   Rhythm:Regular Rate:Normal     Neuro/Psych negative neurological ROS     GI/Hepatic Neg liver ROS, hiatal hernia, GERD  ,  Endo/Other  negative endocrine ROS  Renal/GU negative Renal ROS     Musculoskeletal  (+) Arthritis ,   Abdominal   Peds  Hematology negative hematology ROS (+)   Anesthesia Other Findings   Reproductive/Obstetrics                            Anesthesia Physical Anesthesia Plan  ASA: III  Anesthesia Plan: General   Post-op Pain Management:    Induction: Intravenous  PONV Risk Score and Plan: 2 and Dexamethasone, Ondansetron and Treatment may vary due to age or medical condition  Airway Management Planned: LMA  Additional Equipment:   Intra-op Plan:   Post-operative Plan: Extubation in OR  Informed Consent: I have reviewed the patients History and Physical, chart, labs and discussed the procedure including the risks, benefits and alternatives for the proposed anesthesia with the patient or authorized representative who has indicated his/her understanding and acceptance.     Dental advisory given  Plan Discussed with: CRNA  Anesthesia Plan Comments:        Anesthesia Quick Evaluation

## 2019-09-22 NOTE — Progress Notes (Signed)
Dr. Fransisco Beau, Anesthesiology, reviewed pt history. No new orders.

## 2019-09-23 ENCOUNTER — Encounter (HOSPITAL_COMMUNITY): Payer: Self-pay

## 2019-09-23 ENCOUNTER — Other Ambulatory Visit: Payer: Self-pay

## 2019-09-23 ENCOUNTER — Ambulatory Visit (HOSPITAL_COMMUNITY)
Admission: RE | Admit: 2019-09-23 | Discharge: 2019-09-23 | Disposition: A | Discharge status: Home / Self Care | Payer: Medicare Other | Source: Ambulatory Visit | Attending: Family Medicine | Admitting: Family Medicine

## 2019-09-23 ENCOUNTER — Ambulatory Visit (HOSPITAL_COMMUNITY)
Admission: RE | Admit: 2019-09-23 | Discharge: 2019-09-23 | Disposition: A | Discharge status: Home / Self Care | Payer: Medicare Other | Attending: Family Medicine | Admitting: Family Medicine

## 2019-09-23 ENCOUNTER — Ambulatory Visit (HOSPITAL_COMMUNITY): Payer: Medicare Other | Admitting: Certified Registered Nurse Anesthetist

## 2019-09-23 ENCOUNTER — Encounter (HOSPITAL_COMMUNITY): Admission: RE | Disposition: A | Discharge status: Home / Self Care | Payer: Self-pay | Source: Home / Self Care

## 2019-09-23 DIAGNOSIS — M47816 Spondylosis without myelopathy or radiculopathy, lumbar region: Secondary | ICD-10-CM | POA: Insufficient documentation

## 2019-09-23 DIAGNOSIS — G8929 Other chronic pain: Secondary | ICD-10-CM

## 2019-09-23 DIAGNOSIS — M4854XA Collapsed vertebra, not elsewhere classified, thoracic region, initial encounter for fracture: Secondary | ICD-10-CM | POA: Insufficient documentation

## 2019-09-23 DIAGNOSIS — K219 Gastro-esophageal reflux disease without esophagitis: Secondary | ICD-10-CM | POA: Insufficient documentation

## 2019-09-23 DIAGNOSIS — I10 Essential (primary) hypertension: Secondary | ICD-10-CM | POA: Insufficient documentation

## 2019-09-23 DIAGNOSIS — M069 Rheumatoid arthritis, unspecified: Secondary | ICD-10-CM | POA: Diagnosis not present

## 2019-09-23 DIAGNOSIS — Z79899 Other long term (current) drug therapy: Secondary | ICD-10-CM | POA: Insufficient documentation

## 2019-09-23 DIAGNOSIS — M1990 Unspecified osteoarthritis, unspecified site: Secondary | ICD-10-CM | POA: Diagnosis not present

## 2019-09-23 DIAGNOSIS — Z791 Long term (current) use of non-steroidal anti-inflammatories (NSAID): Secondary | ICD-10-CM | POA: Diagnosis not present

## 2019-09-23 DIAGNOSIS — M48061 Spinal stenosis, lumbar region without neurogenic claudication: Secondary | ICD-10-CM | POA: Insufficient documentation

## 2019-09-23 DIAGNOSIS — Z87891 Personal history of nicotine dependence: Secondary | ICD-10-CM | POA: Insufficient documentation

## 2019-09-23 DIAGNOSIS — M5136 Other intervertebral disc degeneration, lumbar region: Secondary | ICD-10-CM | POA: Diagnosis not present

## 2019-09-23 DIAGNOSIS — I2510 Atherosclerotic heart disease of native coronary artery without angina pectoris: Secondary | ICD-10-CM | POA: Diagnosis not present

## 2019-09-23 DIAGNOSIS — M545 Low back pain: Secondary | ICD-10-CM | POA: Diagnosis not present

## 2019-09-23 DIAGNOSIS — Z7982 Long term (current) use of aspirin: Secondary | ICD-10-CM | POA: Insufficient documentation

## 2019-09-23 DIAGNOSIS — E785 Hyperlipidemia, unspecified: Secondary | ICD-10-CM | POA: Diagnosis not present

## 2019-09-23 HISTORY — DX: Presence of dental prosthetic device (complete) (partial): Z97.2

## 2019-09-23 HISTORY — PX: RADIOLOGY WITH ANESTHESIA: SHX6223

## 2019-09-23 SURGERY — MRI WITH ANESTHESIA
Anesthesia: General

## 2019-09-23 MED ORDER — PHENYLEPHRINE HCL-NACL 10-0.9 MG/250ML-% IV SOLN
INTRAVENOUS | Status: DC | PRN
  Administered 2019-09-23: 30 ug/min via INTRAVENOUS

## 2019-09-23 MED ORDER — FENTANYL CITRATE (PF) 100 MCG/2ML IJ SOLN
INTRAMUSCULAR | Status: DC | PRN
  Administered 2019-09-23: 50 ug via INTRAVENOUS

## 2019-09-23 MED ORDER — ORAL CARE MOUTH RINSE: 15.0000 mL | Freq: Once | OROMUCOSAL | Status: AC

## 2019-09-23 MED ORDER — ONDANSETRON HCL 4 MG/2ML IJ SOLN
INTRAMUSCULAR | Status: DC | PRN
  Administered 2019-09-23: 4 mg via INTRAVENOUS

## 2019-09-23 MED ORDER — DEXAMETHASONE SODIUM PHOSPHATE 4 MG/ML IJ SOLN
INTRAMUSCULAR | Status: DC | PRN
  Administered 2019-09-23: 10 mg via INTRAVENOUS

## 2019-09-23 MED ORDER — LACTATED RINGERS IV SOLN: INTRAVENOUS | Status: DC

## 2019-09-23 MED ORDER — CHLORHEXIDINE GLUCONATE 0.12 % MT SOLN
15.0000 mL | Freq: Once | OROMUCOSAL | Status: AC
  Administered 2019-09-23: 15 mL via OROMUCOSAL
  Filled 2019-09-23: qty 15

## 2019-09-23 MED ORDER — PROPOFOL 10 MG/ML IV BOLUS
INTRAVENOUS | Status: DC | PRN
  Administered 2019-09-23: 150 mg via INTRAVENOUS

## 2019-09-23 MED ORDER — LIDOCAINE 2% (20 MG/ML) 5 ML SYRINGE
INTRAMUSCULAR | Status: DC | PRN
  Administered 2019-09-23: 60 mg via INTRAVENOUS

## 2019-09-23 NOTE — Anesthesia Postprocedure Evaluation (Signed)
Anesthesia Post Note  Patient: Travis Haynes  Procedure(s) Performed: MRI WITH ANESTHESIA; LUMBAR SPINE WITHOUT CONTRAST (N/A )     Patient location during evaluation: PACU Anesthesia Type: General Level of consciousness: awake and alert Pain management: pain level controlled Vital Signs Assessment: post-procedure vital signs reviewed and stable Respiratory status: spontaneous breathing, nonlabored ventilation, respiratory function stable and patient connected to nasal cannula oxygen Cardiovascular status: blood pressure returned to baseline and stable Postop Assessment: no apparent nausea or vomiting Anesthetic complications: no   No complications documented.  Last Vitals:  Vitals:   09/23/19 0914 09/23/19 0915  BP: 127/72   Pulse: 80 82  Resp: (!) 9 20  Temp:  36.6 C  SpO2: 98% 95%    Last Pain:  Vitals:   09/23/19 0915  TempSrc:   PainSc: 0-No pain                 Tiajuana Amass

## 2019-09-23 NOTE — Transfer of Care (Signed)
Immediate Anesthesia Transfer of Care Note  Patient: Travis Haynes  Procedure(s) Performed: MRI WITH ANESTHESIA; LUMBAR SPINE WITHOUT CONTRAST (N/A )  Patient Location: PACU  Anesthesia Type:General  Level of Consciousness: awake, alert  and oriented  Airway & Oxygen Therapy: Patient Spontanous Breathing  Post-op Assessment: Report given to RN and Post -op Vital signs reviewed and stable  Post vital signs: Reviewed and stable  Last Vitals:  Vitals Value Taken Time  BP 114/75 09/23/19 0853  Temp    Pulse 78 09/23/19 0854  Resp 10 09/23/19 0854  SpO2 92 % 09/23/19 0854  Vitals shown include unvalidated device data.  Last Pain:  Vitals:   09/23/19 0708  TempSrc: Oral  PainSc:       Patients Stated Pain Goal: 2 (93/[id number]/69 [id number])  Complications: No complications documented.

## 2019-09-23 NOTE — Anesthesia Procedure Notes (Signed)
Procedure Name: LMA Insertion Date/Time: 09/23/2019 8:10 AM Performed by: Griffin Dakin, CRNA Pre-anesthesia Checklist: Patient identified, Emergency Drugs available, Suction available and Patient being monitored Patient Re-evaluated:Patient Re-evaluated prior to induction Oxygen Delivery Method: Circle system utilized Preoxygenation: Pre-oxygenation with 100% oxygen Induction Type: IV induction LMA: LMA inserted LMA Size: 5.0 Number of attempts: 1 Placement Confirmation: positive ETCO2 and breath sounds checked- equal and bilateral Tube secured with: Tape Dental Injury: Teeth and Oropharynx as per pre-operative assessment

## 2019-09-24 ENCOUNTER — Encounter (HOSPITAL_COMMUNITY): Payer: Self-pay | Admitting: Radiology

## 2019-10-01 ENCOUNTER — Ambulatory Visit: Payer: Medicare Other

## 2019-10-11 DIAGNOSIS — M545 Low back pain: Secondary | ICD-10-CM | POA: Diagnosis not present

## 2019-10-11 DIAGNOSIS — M48062 Spinal stenosis, lumbar region with neurogenic claudication: Secondary | ICD-10-CM | POA: Diagnosis not present

## 2019-10-11 DIAGNOSIS — M5416 Radiculopathy, lumbar region: Secondary | ICD-10-CM | POA: Diagnosis not present

## 2019-10-11 DIAGNOSIS — M5441 Lumbago with sciatica, right side: Secondary | ICD-10-CM | POA: Diagnosis not present

## 2019-10-11 DIAGNOSIS — M5136 Other intervertebral disc degeneration, lumbar region: Secondary | ICD-10-CM | POA: Diagnosis not present

## 2019-10-25 ENCOUNTER — Other Ambulatory Visit: Payer: Self-pay

## 2019-10-25 ENCOUNTER — Ambulatory Visit
Admission: RE | Admit: 2019-10-25 | Discharge: 2019-10-25 | Disposition: A | Discharge status: Home / Self Care | Payer: Medicare Other | Source: Ambulatory Visit | Attending: Internal Medicine | Admitting: Internal Medicine

## 2019-10-25 DIAGNOSIS — E785 Hyperlipidemia, unspecified: Secondary | ICD-10-CM | POA: Insufficient documentation

## 2019-10-25 DIAGNOSIS — R0600 Dyspnea, unspecified: Secondary | ICD-10-CM | POA: Insufficient documentation

## 2019-10-25 DIAGNOSIS — I10 Essential (primary) hypertension: Secondary | ICD-10-CM | POA: Insufficient documentation

## 2019-10-25 DIAGNOSIS — R0602 Shortness of breath: Secondary | ICD-10-CM | POA: Diagnosis not present

## 2019-10-25 LAB — ECHOCARDIOGRAM COMPLETE
AR max vel: 2.24 cm2
AV Area VTI: 1.7 cm2
AV Area mean vel: 1.62 cm2
AV Mean grad: 2 mmHg
AV Peak grad: 3.2 mmHg
Ao pk vel: 0.9 m/s
Area-P 1/2: 5.52 cm2
Calc EF: 44.4 %
Single Plane A2C EF: 46 %
Single Plane A4C EF: 47.9 %

## 2019-10-25 NOTE — Progress Notes (Signed)
*  PRELIMINARY RESULTS* Echocardiogram 2D Echocardiogram has been performed.  Wallie Char Rodgerick Gilliand 10/25/2019, 10:31 AM

## 2019-11-08 ENCOUNTER — Ambulatory Visit (INDEPENDENT_AMBULATORY_CARE_PROVIDER_SITE_OTHER): Payer: Medicare Other

## 2019-11-08 VITALS — BP 128/74 | HR 85 | Temp 97.4°F | Ht 68.0 in | Wt 148.0 lb

## 2019-11-08 DIAGNOSIS — Z0000 Encounter for general adult medical examination without abnormal findings: Secondary | ICD-10-CM

## 2019-11-08 NOTE — Progress Notes (Signed)
I connected with Travis Haynes today by telephone and verified that I am speaking with the correct person using two identifiers. Location patient: home Location provider: work Persons participating in the virtual visit: Glenna Durand LPN, Glenna Durand LPN.   I discussed the limitations, risks, security and privacy concerns of performing an evaluation and management service by telephone and the availability of in person appointments. I also discussed with the patient that there may be a patient responsible charge related to this service. The patient expressed understanding and verbally consented to this telephonic visit.    Interactive audio and video telecommunications were attempted between this provider and patient, however failed, due to patient having technical difficulties OR patient did not have access to video capability.  We continued and completed visit with audio only.    Vital signs may be patient reported or missing.   Subjective:   Travis Haynes is a 74 y.o. male who presents for Medicare Annual/Subsequent preventive examination.  Review of Systems     Cardiac Risk Factors include: advanced age (>53men, >40 women);male gender;sedentary lifestyle     Objective:    Today's Vitals   11/08/19 1256 11/08/19 1257  BP: 128/74   Pulse: 85   Temp: (!) 97.4 F (36.3 C)   SpO2: 95%   Weight: 148 lb (67.1 kg)   Height: 5\' 8"  (1.727 m)   PainSc:  6    Body mass index is 22.5 kg/m.  Advanced Directives 11/08/2019 06/21/2019 03/09/2019 12/09/2017 12/08/2017 07/22/2016  Does Patient Have a Medical Advance Directive? No No No No No No  Would patient like information on creating a medical advance directive? - - No - Patient declined No - Patient declined No - Patient declined No - Patient declined    Current Medications (verified) Outpatient Encounter Medications as of 11/08/2019  Medication Sig  . acetaminophen (TYLENOL) 500 MG tablet Take 1,000 mg by mouth every 6 (six) hours as  needed for mild pain or moderate pain.   Marland Kitchen amLODipine (NORVASC) 5 MG tablet TAKE ONE (1) TABLET BY MOUTH EACH DAY (Patient taking differently: Take 5 mg by mouth daily. )  . aspirin EC 81 MG tablet Take 1 tablet (81 mg total) by mouth daily.  Marland Kitchen atorvastatin (LIPITOR) 10 MG tablet TAKE 1 TABLET BY MOUTH EVERY DAY (Patient taking differently: Take 10 mg by mouth daily. )  . Cholecalciferol (VITAMIN D-3) 1000 units CAPS Take 1,000 Units by mouth daily.  . furosemide (LASIX) 40 MG tablet TAKE 1 TABLET BY MOUTH EVERY DAY (Patient taking differently: Take 40 mg by mouth daily. )  . gabapentin (NEURONTIN) 100 MG capsule Take 100 mg by mouth 2 (two) times daily.   . naproxen sodium (ALEVE) 220 MG tablet Take 220-440 mg by mouth See admin instructions. Take 440 mg by mouth in the morning at 12pm. and 220 mg in the evening 12 AM  . omeprazole (PRILOSEC) 20 MG capsule TAKE 2 CAPSULES BY MOUTH EVERY DAY  . predniSONE (DELTASONE) 5 MG tablet Take 1.5 tablets by mouth daily.  . traMADol (ULTRAM) 50 MG tablet Take 1 tablet (50 mg total) by mouth daily as needed. (Patient taking differently: Take 50 mg by mouth every 4 (four) hours. )  . vitamin B-12 (CYANOCOBALAMIN) 1000 MCG tablet Take 1,000 mcg by mouth daily.  . [DISCONTINUED] amLODipine (NORVASC) 5 MG tablet TAKE ONE (1) TABLET BY MOUTH EACH DAY (Patient taking differently: 5 mg. Take at bedtime)   No facility-administered encounter medications on file  as of 11/08/2019.    Allergies (verified) Ativan [lorazepam] and Gold au 198 [gold]   History: Past Medical History:  Diagnosis Date  . Arthritis    rheumatoid arthritis, takes prednisone for this  . Barrett's esophagus   . Cancer ([place]) 1999   non hodgkins lymphoma  . Chronic back pain   . Chronic pain syndrome   . Claustrophobia   . Coronary artery disease   . GERD (gastroesophageal reflux disease)    barretts esophagus that requires dilation every couple years  . Hemorrhoids   . Hernia of  abdominal cavity   . History of hiatal hernia   . Hyperlipidemia   . Hypertension   . Lumbago   . 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  . Wears dentures   . Weight loss 11/2017   30 lb weight loss over 3 years   Past Surgical History:  Procedure Laterality Date  . APPENDECTOMY  1954  . [place]   2 fusions then 1 surgery to fix the other two (lower)..plates and screws  . CATARACT EXTRACTION W/PHACO Left 12/31/2018   Procedure: CATARACT EXTRACTION PHACO AND INTRAOCULAR LENS PLACEMENT ([place]) LEFT VISION BLUE;  Surgeon: Marchia Meiers, MD;  Location: [facility] ORS;  Service: Ophthalmology;  Laterality: Left;  Lot #[id number] H [place]: 01:51.7 CDE: 18.59  . CYSTECTOMY     from buttocks  . ESOPHAGEAL DILATION     multiple times  . ESOPHAGOGASTRODUODENOSCOPY (EGD) WITH PROPOFOL N/A 12/05/2014   Procedure: ESOPHAGOGASTRODUODENOSCOPY (EGD) WITH PROPOFOL;  Surgeon: Hulen Luster, MD;  Location: [facility] ENDOSCOPY;  Service: Gastroenterology;  Laterality: N/A;  . EXCISION PARTIAL PHALANX Right 12/09/2017   Procedure: EXCISION PARTIAL PHALANX-GREAT TOE;  Surgeon: Sharlotte Alamo, DPM;  Location: [facility] ORS;  Service: Podiatry;  Laterality: Right;  . FOOT SURGERY    . intestine repair  1998   during biopsy his intestines were nicked requiring repair  . MULTIPLE TOOTH EXTRACTIONS    . [place]   fused 2 vertebrae in neck (plate plus screws)  . RADIOLOGY WITH ANESTHESIA N/A 03/09/2019   Procedure: MRI WITH ANESTHESIA LUMBAR WITHOUT CONTRAST;  Surgeon: Radiologist, Medication, MD;  Location: [place];  Service: Radiology;  Laterality: N/A;  . RADIOLOGY WITH ANESTHESIA N/A 09/23/2019   Procedure: MRI WITH ANESTHESIA; LUMBAR SPINE WITHOUT CONTRAST;  Surgeon: Radiologist, Medication, MD;  Location: [place];  Service: Radiology;  Laterality: N/A;  . Reduction masseter muscle/bone extraoral     Family History  Problem Relation Age of Onset  . Heart disease Mother        heart attack  .  Arthritis Mother   . Arthritis Father    Social History   Socioeconomic History  . Marital status: Divorced    Spouse name: Not on file  . Number of children: Not on file  . Years of education: Not on file  . Highest education level: Not on file  Occupational History  . 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 Smoking status: Former Smoker    Types: Cigarettes    Quit date: 1998    Years since quitting: 23.6  . Smokeless tobacco: Never Used  Vaping Use  . Vaping Use: Never used  Substance and Sexual Activity  . Alcohol use: No    Comment: stopped d/t cluster headaches  . Drug use: No  . Sexual activity: Not Currently    Comment: pt did not want to  answer  Other Topics Concern  . Not on file  Social History Narrative  . Not on file   Social Determinants of Health   Financial Resource Strain: Low Risk   . Difficulty of Paying Living Expenses: Not hard at all  Food Insecurity: No Food Insecurity  . Worried About Charity fundraiser in the Last Year: Never true  . Ran Out of Food in the Last Year: Never true  Transportation Needs: No Transportation Needs  . Lack of Transportation (Medical): No  . Lack of Transportation (Non-Medical): No  Physical Activity: Inactive  . Days of Exercise per Week: 0 days  . Minutes of Exercise per Session: 0 min  Stress: No Stress Concern Present  . Feeling of Stress : Not at all  Social Connections:   . Frequency of Communication with Friends and Family:   . Frequency of Social Gatherings with Friends and Family:   . Attends Religious Services:   . Active Member of Clubs or Organizations:   . Attends Archivist Meetings:   Marland Kitchen Marital Status:     Tobacco Counseling Counseling given: Not Answered   Clinical Intake:  Pre-visit preparation completed: Yes  Pain : 0-10 Pain Score: 6  Pain Type: Chronic pain Pain Location: Back Pain Orientation: Lower, Mid Pain Descriptors /  Indicators: Other (Comment) (continuous) Pain Onset: More than a month ago Pain Frequency: Intermittent Pain Relieving Factors: rest helps  Pain Relieving Factors: rest helps  Nutritional Status: BMI of 19-24  Normal Nutritional Risks: None Diabetes: No  How often do you need to have someone help you when you read instructions, pamphlets, or other written materials from your doctor or pharmacy?: 1 - Never What is the last grade level you completed in school?: 12th grade  Diabetic? no  Interpreter Needed?: No  Information entered by :: NAllen LPN   Activities of Daily Living In your present state of health, do you have any difficulty performing the following activities: 11/08/2019 09/23/2019  Hearing? N N  Vision? N N  Difficulty concentrating or making decisions? N N  Walking or climbing stairs? Y Y  Comment has a ramp -  Dressing or bathing? N N  Doing errands, shopping? Y -  Comment someone always goes with -  Preparing Food and eating ? N -  Using the Toilet? N -  In the past six months, have you accidently leaked urine? N -  Do you have problems with loss of bowel control? N -  Managing your Medications? N -  Managing your Finances? N -  Housekeeping or managing your Housekeeping? Y -  Comment son assistance -  Some recent data might be hidden    Patient Care Team: Venita Lick, NP as PCP - General (Nurse Practitioner)  Indicate any recent Medical Services you may have received from other than [facility] providers in the past year (date may be approximate).     Assessment:   This is a routine wellness examination for Keshaun.  Hearing/Vision screen  Hearing Screening   125Hz  250Hz  500Hz  1000Hz  2000Hz  3000Hz  4000Hz  6000Hz  8000Hz   Right ear:           Left ear:           Vision Screening Comments: No regular eye exams, [facility]  Dietary issues and exercise activities discussed: Current Exercise Habits: The patient does not participate in regular  exercise at present  Goals    . Patient Stated  11/08/2019, wants to get back straightened out      Depression Screen PHQ 2/9 Scores 11/08/2019 04/05/2019 07/30/2017 01/21/2017 07/22/2016 07/22/2016 07/21/2015  PHQ - 2 Score 0 0 - 0 - - 0  Exception Documentation - - Patient refusal - Patient refusal Patient refusal -    Fall Risk Fall Risk  11/08/2019 04/05/2019 07/30/2017 01/21/2017 07/22/2016  Falls in the past year? 0 0 No No No  Number falls in past yr: - 0 - - -  Injury with Fall? - 0 - - -  Risk for fall due to : Impaired balance/gait;Impaired mobility;Medication side effect - - - -  Follow up Falls evaluation completed;Education provided;Falls prevention discussed - - - -    Any stairs in or around the home? No  If so, are there any without handrails? n/a Home free of loose throw rugs in walkways, pet beds, electrical cords, etc? Yes  Adequate lighting in your home to reduce risk of falls? Yes   ASSISTIVE DEVICES UTILIZED TO PREVENT FALLS:  Life alert? No  Use of a cane, walker or w/c? Yes  Grab bars in the bathroom? No  Shower chair or bench in shower? Yes  Elevated toilet seat or a handicapped toilet? Yes   TIMED UP AND GO:  Was the test performed? No   Cognitive Function:     6CIT Screen 11/08/2019  What Year? 0 points  What month? 0 points  What time? 0 points  Count back from 20 0 points  Months in reverse 0 points  Repeat phrase 2 points  Total Score 2    Immunizations Immunization History  Administered Date(s) Administered  . Fluad Quad(high Dose 65+) 02/24/2019  . Influenza, High Dose Seasonal PF 01/21/2017, 01/30/2018  . Influenza-Unspecified 01/18/2016  . Pneumococcal Conjugate-13 06/24/2010  . Pneumococcal Polysaccharide-23 04/27/2010  . Tdap 04/27/2010  . Zoster 12/17/2010    TDAP status: Up to date Flu Vaccine status: Up to date Pneumococcal vaccine status: Up to date Covid-19 vaccine status: Declined, Education has been provided regarding  the importance of this vaccine but patient still declined. Advised may receive this vaccine at local pharmacy or Health Dept.or vaccine clinic. Aware to provide a copy of the vaccination record if obtained from local pharmacy or Health Dept. Verbalized acceptance and understanding.  Qualifies for Shingles Vaccine? Yes   Zostavax completed Yes   Shingrix Completed?: No.    Education has been provided regarding the importance of this vaccine. Patient has been advised to call insurance company to determine out of pocket expense if they have not yet received this vaccine. Advised may also receive vaccine at local pharmacy or Health Dept. Verbalized acceptance and understanding.  Screening Tests Health Maintenance  Topic Date Due  . INFLUENZA VACCINE  10/24/2019  . COVID-19 Vaccine (1) 11/24/2019 (Originally 01/18/1958)  . PNA vac Low Risk Adult (2 of 2 - PPSV23) 04/04/2020 (Originally 04/28/2015)  . TETANUS/TDAP  04/27/2020  . COLONOSCOPY  07/16/2020  . Hepatitis C Screening  Completed    Health Maintenance  Health Maintenance Due  Topic Date Due  . INFLUENZA VACCINE  10/24/2019    Colorectal cancer screening: Completed 07/24/2010. Repeat every 10 years  Lung Cancer Screening: (Low Dose CT Chest recommended if Age 71-80 years, 30 pack-year currently smoking OR have quit w/in 15years.) does not qualify.   Lung Cancer Screening Referral: no  Additional Screening:  Hepatitis C Screening: does qualify; Completed 01/23/2015  Vision Screening: Recommended annual ophthalmology exams for early detection of  glaucoma and other disorders of the eye. Is the patient up to date with their annual eye exam?  No  Who is the provider or what is the name of the office in which the patient attends annual eye exams? [facility] If pt is not established with a provider, would they like to be referred to a provider to establish care? No .   Dental Screening: Recommended annual dental exams for proper  oral hygiene  Community Resource Referral / Chronic Care Management: CRR required this visit?  No   CCM required this visit?  No      Plan:     I have personally reviewed and noted the following in the patient's chart:   . Medical and social history . Use of alcohol, tobacco or illicit drugs  . Current medications and supplements . Functional ability and status . Nutritional status . Physical activity . Advanced directives . List of other physicians . Hospitalizations, surgeries, and ER visits in previous 12 months . Vitals . Screenings to include cognitive, depression, and falls . Referrals and appointments  In addition, I have reviewed and discussed with patient certain preventive protocols, quality metrics, and best practice recommendations. A written personalized care plan for preventive services as well as general preventive health recommendations were provided to patient.     Kellie Simmering, LPN   0/30/0923   Nurse Notes:

## 2019-11-08 NOTE — Patient Instructions (Signed)
Travis Haynes , Thank you for taking time to come for your Medicare Wellness Visit. I appreciate your ongoing commitment to your health goals. Please review the following plan we discussed and let me know if I can assist you in the future.   Screening recommendations/referrals: Colonoscopy: completed 07/24/2010 Recommended yearly ophthalmology/optometry visit for glaucoma screening and checkup Recommended yearly dental visit for hygiene and checkup  Vaccinations: Influenza vaccine: due Pneumococcal vaccine: completed 06/24/2010 Tdap vaccine: completed 04/27/2010 Shingles vaccine: discussed   Covid-19: decline  Advanced directives: Advance directive discussed with you today. Even though you declined this today please call our office should you change your mind and we can give you the proper paperwork for you to fill out.   Conditions/risks identified: none  Next appointment: Follow up in one year for your annual wellness visit.   Preventive Care 75 Years and Older, Male Preventive care refers to lifestyle choices and visits with your health care provider that can promote health and wellness. What does preventive care include?  A yearly physical exam. This is also called an annual well check.  Dental exams once or twice a year.  Routine eye exams. Ask your health care provider how often you should have your eyes checked.  Personal lifestyle choices, including:  Daily care of your teeth and gums.  Regular physical activity.  Eating a healthy diet.  Avoiding tobacco and drug use.  Limiting alcohol use.  Practicing safe sex.  Taking low doses of aspirin every day.  Taking vitamin and mineral supplements as recommended by your health care provider. What happens during an annual well check? The services and screenings done by your health care provider during your annual well check will depend on your age, overall health, lifestyle risk factors, and family history of  disease. Counseling  Your health care provider may ask you questions about your:  Alcohol use.  Tobacco use.  Drug use.  Emotional well-being.  Home and relationship well-being.  Sexual activity.  Eating habits.  History of falls.  Memory and ability to understand (cognition).  Work and work Statistician. Screening  You may have the following tests or measurements:  Height, weight, and BMI.  Blood pressure.  Lipid and cholesterol levels. These may be checked every 5 years, or more frequently if you are over 41 years old.  Skin check.  Lung cancer screening. You may have this screening every year starting at age 73 if you have a 30-pack-year history of smoking and currently smoke or have quit within the past 15 years.  Fecal occult blood test (FOBT) of the stool. You may have this test every year starting at age 85.  Flexible sigmoidoscopy or colonoscopy. You may have a sigmoidoscopy every 5 years or a colonoscopy every 10 years starting at age 28.  Prostate cancer screening. Recommendations will vary depending on your family history and other risks.  Hepatitis C blood test.  Hepatitis B blood test.  Sexually transmitted disease (STD) testing.  Diabetes screening. This is done by checking your blood sugar (glucose) after you have not eaten for a while (fasting). You may have this done every 1-3 years.  Abdominal aortic aneurysm (AAA) screening. You may need this if you are a current or former smoker.  Osteoporosis. You may be screened starting at age 24 if you are at high risk. Talk with your health care provider about your test results, treatment options, and if necessary, the need for more tests. Vaccines  Your health care provider may  recommend certain vaccines, such as:  Influenza vaccine. This is recommended every year.  Tetanus, diphtheria, and acellular pertussis (Tdap, Td) vaccine. You may need a Td booster every 10 years.  Zoster vaccine. You may  need this after age 36.  Pneumococcal 13-valent conjugate (PCV13) vaccine. One dose is recommended after age 63.  Pneumococcal polysaccharide (PPSV23) vaccine. One dose is recommended after age 2. Talk to your health care provider about which screenings and vaccines you need and how often you need them. This information is not intended to replace advice given to you by your health care provider. Make sure you discuss any questions you have with your health care provider. Document Released: 04/07/2015 Document Revised: 11/29/2015 Document Reviewed: 01/10/2015 Elsevier Interactive Patient Education  2017 [place] Prevention in the Home Falls can cause injuries. They can happen to people of all ages. There are many things you can do to make your home safe and to help prevent falls. What can I do on the outside of my home?  Regularly fix the edges of walkways and driveways and fix any cracks.  Remove anything that might make you trip as you walk through a door, such as a raised step or threshold.  Trim any bushes or trees on the path to your home.  Use bright outdoor lighting.  Clear any walking paths of anything that might make someone trip, such as rocks or tools.  Regularly check to see if handrails are loose or broken. Make sure that both sides of any steps have handrails.  Any raised decks and porches should have guardrails on the edges.  Have any leaves, snow, or ice cleared regularly.  Use sand or salt on walking paths during winter.  Clean up any spills in your garage right away. This includes oil or grease spills. What can I do in the bathroom?  Use night lights.  Install grab bars by the toilet and in the tub and shower. Do not use towel bars as grab bars.  Use non-skid mats or decals in the tub or shower.  If you need to sit down in the shower, use a plastic, non-slip stool.  Keep the floor dry. Clean up any water that spills on the floor as soon as it  happens.  Remove soap buildup in the tub or shower regularly.  Attach bath mats securely with double-sided non-slip rug tape.  Do not have throw rugs and other things on the floor that can make you trip. What can I do in the bedroom?  Use night lights.  Make sure that you have a light by your bed that is easy to reach.  Do not use any sheets or blankets that are too big for your bed. They should not hang down onto the floor.  Have a firm chair that has side arms. You can use this for support while you get dressed.  Do not have throw rugs and other things on the floor that can make you trip. What can I do in the kitchen?  Clean up any spills right away.  Avoid walking on wet floors.  Keep items that you use a lot in easy-to-reach places.  If you need to reach something above you, use a strong step stool that has a grab bar.  Keep electrical cords out of the way.  Do not use floor polish or wax that makes floors slippery. If you must use wax, use non-skid floor wax.  Do not have throw rugs and  other things on the floor that can make you trip. What can I do with my stairs?  Do not leave any items on the stairs.  Make sure that there are handrails on both sides of the stairs and use them. Fix handrails that are broken or loose. Make sure that handrails are as long as the stairways.  Check any carpeting to make sure that it is firmly attached to the stairs. Fix any carpet that is loose or worn.  Avoid having throw rugs at the top or bottom of the stairs. If you do have throw rugs, attach them to the floor with carpet tape.  Make sure that you have a light switch at the top of the stairs and the bottom of the stairs. If you do not have them, ask someone to add them for you. What else can I do to help prevent falls?  Wear shoes that:  Do not have high heels.  Have rubber bottoms.  Are comfortable and fit you well.  Are closed at the toe. Do not wear sandals.  If you  use a stepladder:  Make sure that it is fully opened. Do not climb a closed stepladder.  Make sure that both sides of the stepladder are locked into place.  Ask someone to hold it for you, if possible.  Clearly mark and make sure that you can see:  Any grab bars or handrails.  First and last steps.  Where the edge of each step is.  Use tools that help you move around (mobility aids) if they are needed. These include:  Canes.  Walkers.  Scooters.  Crutches.  Turn on the lights when you go into a dark area. Replace any light bulbs as soon as they burn out.  Set up your furniture so you have a clear path. Avoid moving your furniture around.  If any of your floors are uneven, fix them.  If there are any pets around you, be aware of where they are.  Review your medicines with your doctor. Some medicines can make you feel dizzy. This can increase your chance of falling. Ask your doctor what other things that you can do to help prevent falls. This information is not intended to replace advice given to you by your health care provider. Make sure you discuss any questions you have with your health care provider. Document Released: 01/05/2009 Document Revised: 08/17/2015 Document Reviewed: 04/15/2014 Elsevier Interactive Patient Education  2017 [employer].

## 2019-11-20 ENCOUNTER — Other Ambulatory Visit: Payer: Self-pay | Admitting: Nurse Practitioner

## 2019-12-17 ENCOUNTER — Other Ambulatory Visit: Payer: Self-pay | Admitting: Nurse Practitioner

## 2019-12-17 NOTE — Telephone Encounter (Signed)
Requested  medications are  due for refill today yes  Requested medications are on the active medication list yes  Last refill 6/21  Future visit scheduled 10/2020  Notes to clinic Do not see this med/dx addressed in an OV, please assess.

## 2020-01-12 DIAGNOSIS — M25422 Effusion, left elbow: Secondary | ICD-10-CM | POA: Diagnosis not present

## 2020-01-12 DIAGNOSIS — I509 Heart failure, unspecified: Secondary | ICD-10-CM | POA: Diagnosis not present

## 2020-01-12 DIAGNOSIS — C8510 Unspecified B-cell lymphoma, unspecified site: Secondary | ICD-10-CM | POA: Diagnosis not present

## 2020-01-12 DIAGNOSIS — M810 Age-related osteoporosis without current pathological fracture: Secondary | ICD-10-CM | POA: Diagnosis not present

## 2020-01-12 DIAGNOSIS — M65841 Other synovitis and tenosynovitis, right hand: Secondary | ICD-10-CM | POA: Diagnosis not present

## 2020-01-12 DIAGNOSIS — Z5181 Encounter for therapeutic drug level monitoring: Secondary | ICD-10-CM | POA: Diagnosis not present

## 2020-01-12 DIAGNOSIS — M0579 Rheumatoid arthritis with rheumatoid factor of multiple sites without organ or systems involvement: Secondary | ICD-10-CM | POA: Diagnosis not present

## 2020-01-12 DIAGNOSIS — Z23 Encounter for immunization: Secondary | ICD-10-CM | POA: Diagnosis not present

## 2020-01-12 DIAGNOSIS — Z87891 Personal history of nicotine dependence: Secondary | ICD-10-CM | POA: Diagnosis not present

## 2020-01-12 DIAGNOSIS — Z981 Arthrodesis status: Secondary | ICD-10-CM | POA: Diagnosis not present

## 2020-01-12 DIAGNOSIS — M06031 Rheumatoid arthritis without rheumatoid factor, right wrist: Secondary | ICD-10-CM | POA: Diagnosis not present

## 2020-01-26 DIAGNOSIS — M48062 Spinal stenosis, lumbar region with neurogenic claudication: Secondary | ICD-10-CM | POA: Diagnosis not present

## 2020-01-26 DIAGNOSIS — M5441 Lumbago with sciatica, right side: Secondary | ICD-10-CM | POA: Diagnosis not present

## 2020-01-26 DIAGNOSIS — M545 Low back pain, unspecified: Secondary | ICD-10-CM | POA: Diagnosis not present

## 2020-01-26 DIAGNOSIS — M5416 Radiculopathy, lumbar region: Secondary | ICD-10-CM | POA: Diagnosis not present

## 2020-01-26 DIAGNOSIS — M5136 Other intervertebral disc degeneration, lumbar region: Secondary | ICD-10-CM | POA: Diagnosis not present

## 2020-02-02 ENCOUNTER — Other Ambulatory Visit: Payer: Self-pay | Admitting: Nurse Practitioner

## 2020-02-02 NOTE — Telephone Encounter (Signed)
Requested Prescriptions  Pending Prescriptions Disp Refills   amLODipine (NORVASC) 5 MG tablet [Pharmacy Med Name: AMLODIPINE BESYLATE 5 MG TAB] 90 tablet 0    Sig: TAKE ONE (1) TABLET BY MOUTH EACH DAY     Cardiovascular:  Calcium Channel Blockers Passed - 02/02/2020  1:40 AM      Passed - Last BP in normal range    BP Readings from Last 1 Encounters:  11/08/19 128/74         Passed - Valid encounter within last 6 months    Recent Outpatient Visits          4 months ago Preoperative clearance   [facility] [place], Bennett T, NP   6 months ago Lymphedema   [place], Dandridge T, NP   7 months ago Compression fracture of thoracic vertebra, unspecified thoracic vertebral level, sequela   [place], Jolene T, NP   7 months ago Upper respiratory tract infection, unspecified type   [employer], [place] T, NP   9 months ago Lymphedema   [place], Malott T, NP      Future Appointments            In 9 months [place], [facility]             atorvastatin (LIPITOR) 10 MG tablet [Pharmacy Med Name: ATORVASTATIN 10 MG TABLET] 90 tablet 0    Sig: TAKE 1 TABLET BY MOUTH EVERY DAY     Cardiovascular:  Antilipid - Statins Failed - 02/02/2020  1:40 AM      Failed - LDL in normal range and within 360 days    LDL Chol Calc (NIH)  Date Value Ref Range Status  02/24/2019 55 0 - 99 mg/dL Final         Passed - Total Cholesterol in normal range and within 360 days    Cholesterol, Total  Date Value Ref Range Status  02/24/2019 167 100 - 199 mg/dL Final   Cholesterol Piccolo, Waived  Date Value Ref Range Status  01/23/2015 CANCELED mg/dL     Comment:    [facility] was unable to obtain a suitable specimen for the following tests and is providing the patient with recollection instructions.                         Desirable                <200                         Borderline  High      200- 239                         High                     >239  Result canceled by the ancillary          Passed - HDL in normal range and within 360 days    HDL  Date Value Ref Range Status  02/24/2019 90 >39 mg/dL Final         Passed - Triglycerides in normal range and within 360 days    Triglycerides  Date Value Ref Range Status  02/24/2019 131 0 - 149 mg/dL Final   Triglycerides Piccolo,Waived  Date Value Ref Range Status  01/23/2015  CANCELED      Comment:    Test not performed  Result canceled by the ancillary          Passed - Patient is not pregnant      Passed - Valid encounter within last 12 months    Recent Outpatient Visits          4 months ago Preoperative clearance   [place], Barbaraann Faster, NP   6 months ago Lymphedema   [place], [place] T, NP   7 months ago Compression fracture of thoracic vertebra, unspecified thoracic vertebral level, sequela   [place], Jolene T, NP   7 months ago Upper respiratory tract infection, unspecified type   [place], Barbaraann Faster, NP   9 months ago Lymphedema   [place], Barbaraann Faster, NP      Future Appointments            In 9 months [facility], [facility]

## 2020-02-21 ENCOUNTER — Other Ambulatory Visit: Payer: Self-pay | Admitting: Nurse Practitioner

## 2020-03-27 ENCOUNTER — Other Ambulatory Visit: Payer: Self-pay

## 2020-03-27 MED ORDER — OMEPRAZOLE 20 MG PO CPDR: 40.0000 mg | DELAYED_RELEASE_CAPSULE | Freq: Every day | ORAL | 4 refills | Status: AC

## 2020-03-27 NOTE — Telephone Encounter (Signed)
Refill request for Omeprazole 20 mg 2 caps QD.  Last OV 09/07/19 No future appointment noted

## 2020-04-13 ENCOUNTER — Other Ambulatory Visit: Payer: Self-pay

## 2020-04-13 ENCOUNTER — Encounter: Payer: Self-pay | Admitting: Nurse Practitioner

## 2020-04-13 ENCOUNTER — Telehealth: Payer: Self-pay

## 2020-04-13 ENCOUNTER — Ambulatory Visit (INDEPENDENT_AMBULATORY_CARE_PROVIDER_SITE_OTHER): Payer: Medicare Other | Admitting: Nurse Practitioner

## 2020-04-13 VITALS — BP 121/75 | HR 92

## 2020-04-13 DIAGNOSIS — M069 Rheumatoid arthritis, unspecified: Secondary | ICD-10-CM | POA: Diagnosis not present

## 2020-04-13 DIAGNOSIS — J019 Acute sinusitis, unspecified: Secondary | ICD-10-CM

## 2020-04-13 DIAGNOSIS — Z8781 Personal history of (healed) traumatic fracture: Secondary | ICD-10-CM | POA: Insufficient documentation

## 2020-04-13 MED ORDER — FUROSEMIDE 40 MG PO TABS: 40.0000 mg | ORAL_TABLET | ORAL | 0 refills | Status: AC | PRN

## 2020-04-13 MED ORDER — DOXYCYCLINE HYCLATE 100 MG PO TABS: 100.0000 mg | ORAL_TABLET | Freq: Two times a day (BID) | ORAL | 0 refills | Status: DC

## 2020-04-13 MED ORDER — HYDROCHLOROTHIAZIDE 25 MG PO TABS
25.0000 mg | ORAL_TABLET | Freq: Every day | ORAL | 4 refills | Status: DC
Start: 2020-04-13 — End: 2020-10-07

## 2020-04-13 NOTE — Chronic Care Management (AMB) (Signed)
  Chronic Care Management   Note  04/13/2020 Name: Travis Haynes MRN: [id number] DOB: 08-Feb-1946  Travis Haynes is a 75 y.o. year old male who is a primary care patient of [place], Barbaraann Faster, NP. I reached out to Harolyn Rutherford by phone today in response to a referral sent by Travis Haynes's PCP, Marnee Guarneri, NP      Travis Haynes was given information about Chronic Care Management services today including:  1. CCM service includes personalized support from designated clinical staff supervised by his physician, including individualized plan of care and coordination with other care providers 2. 24/7 contact phone numbers for assistance for urgent and routine care needs. 3. Service will only be billed when office clinical staff spend 20 minutes or more in a month to coordinate care. 4. Only one practitioner may furnish and bill the service in a calendar month. 5. The patient may stop CCM services at any time (effective at the end of the month) by phone call to the office staff. 6. The patient will be responsible for cost sharing (co-pay) of up to 20% of the service fee (after annual deductible is met).  Patient did not agree to enrollment in care management services and does not wish to consider at this time.  Follow up plan: Patient declines further follow up and engagement by the care management team. Appropriate care team members and provider have been notified via electronic communication.  The patient has been provided with contact information for the care management team and has been advised to call with any health related questions or concerns.   Noreene Larsson, [place], [place], [place] [address] Direct Dial: [phone] [email or web address]

## 2020-04-13 NOTE — Patient Instructions (Signed)

## 2020-04-13 NOTE — Progress Notes (Signed)
BP 121/75   Pulse 92    Subjective:    Patient ID: Travis Haynes, male    DOB: 1946-03-09, 75 y.o.   MRN: [id number]  HPI: Travis Haynes is a 75 y.o. male  Chief Complaint  Patient presents with  . Cough    Started about 3 days, and has been coughing up stuff.  . Nasal Congestion    . This visit was completed via telephone due to the restrictions of the COVID-19 pandemic. All issues as above were discussed and addressed but no physical exam was performed. If it was felt that the patient should be evaluated in the office, they were directed there. The patient verbally consented to this visit. Patient was unable to complete an audio/visual visit due to Lack of equipment. Due to the catastrophic nature of the COVID-19 pandemic, this visit was done through audio contact only. . Location of the patient: home . Location of the provider: work . Those involved with this call:  . Provider: Marnee Guarneri, DNP . CMA: Frazier Butt, CMA . Front Desk/Registration: Jill Side  . Time spent on call: 21 minutes on the phone discussing health concerns. 15 minutes total spent in review of patient's record and preparation of their chart.  . I verified patient identity using two factors (patient name and date of birth). Patient consents verbally to being seen via telemedicine visit today.    UPPER RESPIRATORY TRACT INFECTION Started with symptoms 6 days ago with congestion and sinus issues.  Then cough presented, non productive.  Reports overall feeling stable.  No loss of taste or smell.  Is not Covid vaccinated.  Discussed testing at office with him, he refuses to have this done - reporting he has not been leaving house due to Covid and does not intend to leave.  Recommend he obtain an at home test and perform.  States last year when he had this Doxcycline cleared it up. Fever: no Cough: yes Shortness of breath: no Wheezing: no Chest pain: no Chest tightness: no Chest congestion: no Nasal  congestion: yes Runny nose: yes Post nasal drip: yes Sneezing: no Sore throat: no Swollen glands: no Sinus pressure: no Headache: no Face pain: no Toothache: no Ear pain: none Ear pressure: none Eyes red/itching:no Eye drainage/crusting: no  Vomiting: no Rash: no Fatigue: yes Sick contacts: yes Strep contacts: no  Context: stable Recurrent sinusitis: no Relief with OTC cold/cough medications: no  Treatments attempted: none   Relevant past medical, surgical, family and social history reviewed and updated as indicated. Interim medical history since our last visit reviewed. Allergies and medications reviewed and updated.  Review of Systems  Constitutional: Negative for activity change, diaphoresis, fatigue and fever.  HENT: Positive for congestion, postnasal drip, rhinorrhea and sinus pressure. Negative for ear discharge, ear pain, sinus pain and sore throat.   Respiratory: Positive for cough. Negative for chest tightness, shortness of breath and wheezing.   Cardiovascular: Negative for chest pain, palpitations and leg swelling.  Gastrointestinal: Negative.   Musculoskeletal: Negative for myalgias.  Neurological: Negative.   Psychiatric/Behavioral: Negative.     Per HPI unless specifically indicated above     Objective:    BP 121/75   Pulse 92   Wt Readings from Last 3 Encounters:  11/08/19 148 lb (67.1 kg)  09/23/19 145 lb (65.8 kg)  09/07/19 139 lb 14.4 oz (63.5 kg)    Physical Exam   Unable to perform due to telephone visit only, no SOB noted with  talking and intermittent mild cough noted.  Results for orders placed or performed during the hospital encounter of 10/25/19  ECHOCARDIOGRAM COMPLETE  Result Value Ref Range   Ao pk vel 0.90 m/s   AV Area VTI 1.70 cm2   AR max vel 2.24 cm2   AV Mean grad 2.0 mmHg   AV Peak grad 3.2 mmHg   Single Plane A2C EF 46.0 %   Single Plane A4C EF 47.9 %   Calc EF 44.4 %   AV Area mean vel 1.62 cm2   Area-P 1/2 5.52  cm2      Assessment & Plan:   Problem List Items Addressed This Visit      Respiratory   Sinusitis - Primary    Acute x 6 days with no improvement.   Discussed testing at office with him, he refuses to have this done - reporting he has not been leaving house due to Covid and does not intend to leave.  Recommend he obtain an at home test and perform -- alert provider to results.  States last year when he had this Doxcycline cleared it up.  Self quarantine at this time for 10 days.  Script for Doxycycline sent, he is already on daily Prednisone for arthritis, will maintain this.  Continue rest and plenty of fluid.  Return for worsening or ongoing symptoms.      Relevant Medications   doxycycline (VIBRA-TABS) 100 MG tablet     Musculoskeletal and Integument   Rheumatoid arthritis ([place])   Relevant Orders   AMB Referral to [place]      I discussed the assessment and treatment plan with the patient. The patient was provided an opportunity to ask questions and all were answered. The patient agreed with the plan and demonstrated an understanding of the instructions.   The patient was advised to call back or seek an in-person evaluation if the symptoms worsen or if the condition fails to improve as anticipated.   I provided 21+ minutes of time during this encounter.  Follow up plan: Return if symptoms worsen or fail to improve.

## 2020-04-13 NOTE — Assessment & Plan Note (Signed)
Acute x 6 days with no improvement.   Discussed testing at office with him, he refuses to have this done - reporting he has not been leaving house due to Covid and does not intend to leave.  Recommend he obtain an at home test and perform -- alert provider to results.  States last year when he had this Doxcycline cleared it up.  Self quarantine at this time for 10 days.  Script for Doxycycline sent, he is already on daily Prednisone for arthritis, will maintain this.  Continue rest and plenty of fluid.  Return for worsening or ongoing symptoms.

## 2020-05-01 ENCOUNTER — Other Ambulatory Visit: Payer: Self-pay | Admitting: Nurse Practitioner

## 2020-05-01 NOTE — Telephone Encounter (Signed)
Requested Prescriptions  Pending Prescriptions Disp Refills  . amLODipine (NORVASC) 5 MG tablet [Pharmacy Med Name: AMLODIPINE BESYLATE 5 MG TAB] 90 tablet 0    Sig: TAKE ONE (1) TABLET BY MOUTH EACH DAY     Cardiovascular:  Calcium Channel Blockers Passed - 05/01/2020  8:31 AM      Passed - Last BP in normal range    BP Readings from Last 1 Encounters:  04/13/20 121/75         Passed - Valid encounter within last 6 months    Recent Outpatient Visits          2 weeks ago Acute non-recurrent sinusitis, unspecified location   [facility] [place], Barbaraann Faster, NP   7 months ago Preoperative clearance   [place], Barbaraann Faster, NP   9 months ago Lymphedema   [facility] [place], [place] T, NP   9 months ago Compression fracture of thoracic vertebra, unspecified thoracic vertebral level, sequela   [place], Jolene T, NP   10 months ago Upper respiratory tract infection, unspecified type   [place], Barbaraann Faster, NP      Future Appointments            In 6 months [facility], [facility]

## 2020-05-02 ENCOUNTER — Other Ambulatory Visit: Payer: Self-pay | Admitting: Nurse Practitioner

## 2020-05-02 NOTE — Telephone Encounter (Signed)
Pt needs labs done. Courtesy refill #30 Requested Prescriptions  Pending Prescriptions Disp Refills  . atorvastatin (LIPITOR) 10 MG tablet [Pharmacy Med Name: ATORVASTATIN 10 MG TABLET] 90 tablet 0    Sig: TAKE 1 TABLET BY MOUTH EVERY DAY     Cardiovascular:  Antilipid - Statins Failed - 05/02/2020  1:19 AM      Failed - Total Cholesterol in normal range and within 360 days    Cholesterol, Total  Date Value Ref Range Status  02/24/2019 167 100 - 199 mg/dL Final   Cholesterol Piccolo, Waived  Date Value Ref Range Status  01/23/2015 CANCELED mg/dL     Comment:    [facility] was unable to obtain a suitable specimen for the following tests and is providing the patient with recollection instructions.                         Desirable                <200                         Borderline High      200- 239                         High                     >239  Result canceled by the ancillary          Failed - LDL in normal range and within 360 days    LDL Chol Calc (NIH)  Date Value Ref Range Status  02/24/2019 55 0 - 99 mg/dL Final         Failed - HDL in normal range and within 360 days    HDL  Date Value Ref Range Status  02/24/2019 90 >39 mg/dL Final         Failed - Triglycerides in normal range and within 360 days    Triglycerides  Date Value Ref Range Status  02/24/2019 131 0 - 149 mg/dL Final   Triglycerides Piccolo,Waived  Date Value Ref Range Status  01/23/2015 CANCELED      Comment:    Test not performed  Result canceled by the ancillary          Passed - Patient is not pregnant      Passed - Valid encounter within last 12 months    Recent Outpatient Visits          2 weeks ago Acute non-recurrent sinusitis, unspecified location   [facility] [place], Barbaraann Faster, NP   7 months ago Preoperative clearance   [place], Barbaraann Faster, NP   9 months ago Lymphedema   [place], [place] T, NP   10 months  ago Compression fracture of thoracic vertebra, unspecified thoracic vertebral level, sequela   [place], Jolene T, NP   10 months ago Upper respiratory tract infection, unspecified type   [place], Barbaraann Faster, NP      Future Appointments            In 6 months [facility], [facility]

## 2020-05-23 ENCOUNTER — Other Ambulatory Visit: Payer: Self-pay | Admitting: Nurse Practitioner

## 2020-05-28 ENCOUNTER — Other Ambulatory Visit: Payer: Self-pay | Admitting: Nurse Practitioner

## 2020-05-28 NOTE — Telephone Encounter (Signed)
Requested medication (s) are due for refill today: yes  Requested medication (s) are on the active medication list: yes  Last refill:  05/02/20- courtesy refill  Future visit scheduled: no  Notes to clinic:  called pt and advised that he needs an  OV- pt stated his son provides transportation and he will call back in a few days.   Requested Prescriptions  Pending Prescriptions Disp Refills   atorvastatin (LIPITOR) 10 MG tablet [Pharmacy Med Name: ATORVASTATIN 10 MG TABLET] 30 tablet 0    Sig: TAKE 1 TABLET BY MOUTH EVERY DAY      Cardiovascular:  Antilipid - Statins Failed - 05/28/2020  4:47 PM      Failed - Total Cholesterol in normal range and within 360 days    Cholesterol, Total  Date Value Ref Range Status  02/24/2019 167 100 - 199 mg/dL Final   Cholesterol Piccolo, Waived  Date Value Ref Range Status  01/23/2015 CANCELED mg/dL     Comment:    [facility] was unable to obtain a suitable specimen for the following tests and is providing the patient with recollection instructions.                         Desirable                <200                         Borderline High      200- 239                         High                     >239  Result canceled by the ancillary           Failed - LDL in normal range and within 360 days    LDL Chol Calc (NIH)  Date Value Ref Range Status  02/24/2019 55 0 - 99 mg/dL Final          Failed - HDL in normal range and within 360 days    HDL  Date Value Ref Range Status  02/24/2019 90 >39 mg/dL Final          Failed - Triglycerides in normal range and within 360 days    Triglycerides  Date Value Ref Range Status  02/24/2019 131 0 - 149 mg/dL Final   Triglycerides Piccolo,Waived  Date Value Ref Range Status  01/23/2015 CANCELED      Comment:    Test not performed  Result canceled by the ancillary           Passed - Patient is not pregnant      Passed - Valid encounter within last 12 months    Recent Outpatient  Visits           1 month ago Acute non-recurrent sinusitis, unspecified location   [facility] [place], Barbaraann Faster, NP   8 months ago Preoperative clearance   [place], Barbaraann Faster, NP   10 months ago Lymphedema   [place], [place] T, NP   10 months ago Compression fracture of thoracic vertebra, unspecified thoracic vertebral level, sequela   [place], Jolene T, NP   11 months ago Upper respiratory tract infection, unspecified type   [facility] [place], [place] T,  NP       Future Appointments             In 5 months [place], [facility]

## 2020-07-14 DIAGNOSIS — M5416 Radiculopathy, lumbar region: Secondary | ICD-10-CM | POA: Diagnosis not present

## 2020-07-14 DIAGNOSIS — M48062 Spinal stenosis, lumbar region with neurogenic claudication: Secondary | ICD-10-CM | POA: Diagnosis not present

## 2020-07-25 ENCOUNTER — Other Ambulatory Visit: Payer: Self-pay | Admitting: Nurse Practitioner

## 2020-08-10 DIAGNOSIS — M47816 Spondylosis without myelopathy or radiculopathy, lumbar region: Secondary | ICD-10-CM | POA: Diagnosis not present

## 2020-08-10 DIAGNOSIS — M5416 Radiculopathy, lumbar region: Secondary | ICD-10-CM | POA: Diagnosis not present

## 2020-08-10 DIAGNOSIS — M48062 Spinal stenosis, lumbar region with neurogenic claudication: Secondary | ICD-10-CM | POA: Diagnosis not present

## 2020-08-10 DIAGNOSIS — M5136 Other intervertebral disc degeneration, lumbar region: Secondary | ICD-10-CM | POA: Diagnosis not present

## 2020-09-06 ENCOUNTER — Other Ambulatory Visit: Payer: Self-pay

## 2020-09-06 ENCOUNTER — Emergency Department: Payer: Medicare Other

## 2020-09-06 DIAGNOSIS — R079 Chest pain, unspecified: Principal | ICD-10-CM | POA: Insufficient documentation

## 2020-09-06 DIAGNOSIS — I499 Cardiac arrhythmia, unspecified: Secondary | ICD-10-CM | POA: Diagnosis not present

## 2020-09-06 DIAGNOSIS — Z20822 Contact with and (suspected) exposure to covid-19: Secondary | ICD-10-CM | POA: Diagnosis not present

## 2020-09-06 DIAGNOSIS — R0789 Other chest pain: Secondary | ICD-10-CM | POA: Diagnosis not present

## 2020-09-06 DIAGNOSIS — Z87891 Personal history of nicotine dependence: Secondary | ICD-10-CM | POA: Insufficient documentation

## 2020-09-06 DIAGNOSIS — S2231XA Fracture of one rib, right side, initial encounter for closed fracture: Secondary | ICD-10-CM | POA: Diagnosis not present

## 2020-09-06 DIAGNOSIS — E876 Hypokalemia: Secondary | ICD-10-CM | POA: Diagnosis not present

## 2020-09-06 DIAGNOSIS — Z8572 Personal history of non-Hodgkin lymphomas: Secondary | ICD-10-CM | POA: Diagnosis not present

## 2020-09-06 DIAGNOSIS — K449 Diaphragmatic hernia without obstruction or gangrene: Secondary | ICD-10-CM | POA: Diagnosis not present

## 2020-09-06 DIAGNOSIS — Z7982 Long term (current) use of aspirin: Secondary | ICD-10-CM | POA: Diagnosis not present

## 2020-09-06 DIAGNOSIS — Z79899 Other long term (current) drug therapy: Secondary | ICD-10-CM | POA: Insufficient documentation

## 2020-09-06 DIAGNOSIS — Z743 Need for continuous supervision: Secondary | ICD-10-CM | POA: Diagnosis not present

## 2020-09-06 DIAGNOSIS — I10 Essential (primary) hypertension: Secondary | ICD-10-CM | POA: Diagnosis not present

## 2020-09-06 DIAGNOSIS — R0781 Pleurodynia: Secondary | ICD-10-CM | POA: Diagnosis not present

## 2020-09-06 DIAGNOSIS — I4510 Unspecified right bundle-branch block: Secondary | ICD-10-CM | POA: Diagnosis not present

## 2020-09-06 DIAGNOSIS — R059 Cough, unspecified: Secondary | ICD-10-CM | POA: Diagnosis not present

## 2020-09-06 DIAGNOSIS — R0602 Shortness of breath: Secondary | ICD-10-CM | POA: Diagnosis not present

## 2020-09-06 DIAGNOSIS — R52 Pain, unspecified: Secondary | ICD-10-CM | POA: Diagnosis not present

## 2020-09-06 DIAGNOSIS — R0902 Hypoxemia: Secondary | ICD-10-CM | POA: Diagnosis not present

## 2020-09-06 LAB — CBC
HCT: 41.2 % (ref 39.0–52.0)
Hemoglobin: 14.3 g/dL (ref 13.0–17.0)
MCH: 32 pg (ref 26.0–34.0)
MCHC: 34.7 g/dL (ref 30.0–36.0)
MCV: 92.2 fL (ref 80.0–100.0)
Platelets: 247 10*3/uL (ref 150–400)
RBC: 4.47 MIL/uL (ref 4.22–5.81)
RDW: 13.2 % (ref 11.5–15.5)
WBC: 13.2 10*3/uL — ABNORMAL HIGH (ref 4.0–10.5)
nRBC: 0 % (ref 0.0–0.2)

## 2020-09-06 LAB — TROPONIN I (HIGH SENSITIVITY): Troponin I (High Sensitivity): 16 ng/L (ref <18)

## 2020-09-06 NOTE — ED Triage Notes (Signed)
Patient arrives via EMS with complaints of right sided rib pain. Patient reports SOB, and cough x 3 days as well. Patient reports "my oxygen levels go down when I lay on my side." Patient is alert, oriented x4. Patient reports daily 81mg  ASA use.

## 2020-09-06 NOTE — ED Notes (Signed)
Recollect of green and lavender top tubes sent to the lab at this time.

## 2020-09-07 ENCOUNTER — Observation Stay
Admission: EM | Admit: 2020-09-07 | Discharge: 2020-09-07 | Disposition: A | Discharge status: Home / Self Care | Payer: Medicare Other | Attending: Internal Medicine | Admitting: Internal Medicine

## 2020-09-07 ENCOUNTER — Other Ambulatory Visit: Payer: Self-pay

## 2020-09-07 ENCOUNTER — Emergency Department: Payer: Medicare Other

## 2020-09-07 DIAGNOSIS — Z8572 Personal history of non-Hodgkin lymphomas: Secondary | ICD-10-CM

## 2020-09-07 DIAGNOSIS — R0602 Shortness of breath: Secondary | ICD-10-CM | POA: Diagnosis not present

## 2020-09-07 DIAGNOSIS — I2510 Atherosclerotic heart disease of native coronary artery without angina pectoris: Secondary | ICD-10-CM | POA: Diagnosis present

## 2020-09-07 DIAGNOSIS — I4510 Unspecified right bundle-branch block: Secondary | ICD-10-CM

## 2020-09-07 DIAGNOSIS — M069 Rheumatoid arthritis, unspecified: Secondary | ICD-10-CM | POA: Diagnosis present

## 2020-09-07 DIAGNOSIS — K449 Diaphragmatic hernia without obstruction or gangrene: Secondary | ICD-10-CM | POA: Diagnosis not present

## 2020-09-07 DIAGNOSIS — E785 Hyperlipidemia, unspecified: Secondary | ICD-10-CM | POA: Diagnosis present

## 2020-09-07 DIAGNOSIS — E876 Hypokalemia: Secondary | ICD-10-CM

## 2020-09-07 DIAGNOSIS — I10 Essential (primary) hypertension: Secondary | ICD-10-CM | POA: Diagnosis present

## 2020-09-07 DIAGNOSIS — K219 Gastro-esophageal reflux disease without esophagitis: Secondary | ICD-10-CM | POA: Diagnosis present

## 2020-09-07 DIAGNOSIS — R079 Chest pain, unspecified: Secondary | ICD-10-CM

## 2020-09-07 DIAGNOSIS — Z8781 Personal history of (healed) traumatic fracture: Secondary | ICD-10-CM

## 2020-09-07 DIAGNOSIS — R059 Cough, unspecified: Secondary | ICD-10-CM | POA: Diagnosis not present

## 2020-09-07 DIAGNOSIS — I5042 Chronic combined systolic (congestive) and diastolic (congestive) heart failure: Secondary | ICD-10-CM | POA: Diagnosis present

## 2020-09-07 LAB — BASIC METABOLIC PANEL
Anion gap: 12 (ref 5–15)
Anion gap: 14 (ref 5–15)
BUN: 17 mg/dL (ref 8–23)
BUN: 20 mg/dL (ref 8–23)
CO2: 28 mmol/L (ref 22–32)
CO2: 30 mmol/L (ref 22–32)
Calcium: 8.6 mg/dL — ABNORMAL LOW (ref 8.9–10.3)
Calcium: 9.3 mg/dL (ref 8.9–10.3)
Chloride: 90 mmol/L — ABNORMAL LOW (ref 98–111)
Chloride: 93 mmol/L — ABNORMAL LOW (ref 98–111)
Creatinine, Ser: 1.08 mg/dL (ref 0.61–1.24)
Creatinine, Ser: 1.34 mg/dL — ABNORMAL HIGH (ref 0.61–1.24)
GFR, Estimated: 56 mL/min — ABNORMAL LOW (ref >60)
GFR, Estimated: >60 mL/min (ref >60)
Glucose, Bld: 103 mg/dL — ABNORMAL HIGH (ref 70–99)
Glucose, Bld: 129 mg/dL — ABNORMAL HIGH (ref 70–99)
Potassium: 2.1 mmol/L — CL (ref 3.5–5.1)
Potassium: 3 mmol/L — ABNORMAL LOW (ref 3.5–5.1)
Sodium: 133 mmol/L — ABNORMAL LOW (ref 135–145)
Sodium: 134 mmol/L — ABNORMAL LOW (ref 135–145)

## 2020-09-07 LAB — MAGNESIUM
Magnesium: 1.5 mg/dL — ABNORMAL LOW (ref 1.7–2.4)
Magnesium: 1.8 mg/dL (ref 1.7–2.4)

## 2020-09-07 LAB — RESP PANEL BY RT-PCR (FLU A&B, COVID) ARPGX2
Influenza A by PCR: NEGATIVE
Influenza B by PCR: NEGATIVE
SARS Coronavirus 2 by RT PCR: NEGATIVE

## 2020-09-07 LAB — POTASSIUM: Potassium: 2.3 mmol/L — CL (ref 3.5–5.1)

## 2020-09-07 MED ORDER — POTASSIUM CHLORIDE CRYS ER 20 MEQ PO TBCR
40.0000 meq | EXTENDED_RELEASE_TABLET | Freq: Once | ORAL | Status: AC
  Administered 2020-09-07: 40 meq via ORAL
  Filled 2020-09-07: qty 2

## 2020-09-07 MED ORDER — HYDRALAZINE HCL 20 MG/ML IJ SOLN: 5.0000 mg | INTRAMUSCULAR | Status: DC | PRN

## 2020-09-07 MED ORDER — NITROGLYCERIN 0.4 MG SL SUBL: 0.4000 mg | SUBLINGUAL_TABLET | SUBLINGUAL | Status: DC | PRN

## 2020-09-07 MED ORDER — ACETAMINOPHEN 325 MG PO TABS: 650.0000 mg | ORAL_TABLET | Freq: Four times a day (QID) | ORAL | Status: DC | PRN

## 2020-09-07 MED ORDER — IOHEXOL 350 MG/ML SOLN
75.0000 mL | Freq: Once | INTRAVENOUS | Status: AC | PRN
  Administered 2020-09-07: 75 mL via INTRAVENOUS

## 2020-09-07 MED ORDER — MORPHINE SULFATE (PF) 2 MG/ML IV SOLN: 1.0000 mg | INTRAVENOUS | Status: DC | PRN

## 2020-09-07 MED ORDER — MAGNESIUM SULFATE 2 GM/50ML IV SOLN
2.0000 g | Freq: Once | INTRAVENOUS | Status: AC
  Administered 2020-09-07: 2 g via INTRAVENOUS
  Filled 2020-09-07: qty 50

## 2020-09-07 MED ORDER — POTASSIUM CHLORIDE 10 MEQ/100ML IV SOLN
10.0000 meq | INTRAVENOUS | Status: AC
  Administered 2020-09-07 (×4): 10 meq via INTRAVENOUS
  Filled 2020-09-07 (×4): qty 100

## 2020-09-07 MED ORDER — POTASSIUM CHLORIDE CRYS ER 20 MEQ PO TBCR
40.0000 meq | EXTENDED_RELEASE_TABLET | ORAL | Status: DC
  Filled 2020-09-07: qty 2

## 2020-09-07 NOTE — ED Notes (Addendum)
Pt states that he wants to go home, states he is irritated that he has been here all night, that he can go home and take care of himself, that nothing is wrong or been found wrong. Pt also states that "[place] would have seen me by now". Pt educated on continuous labs, blood work, and medication administration through out the night. Md Blaine Hamper aware, unable to come see patient at this time.  Pt also refuses to take oral potassium pills, states they "already gave me that last night".   This RN educated patient on need to stay, offered different bed and recliner, pt denies requests. Pt reports need to ambulate to help with leg cramps, this RN ask patient how he walks at home, pt reports difficulty ambulating. This RN stated that it would be unsafe to ambulate patient due to this. Assisted patient into wheelchair. States son will come and pick him up. Confirmed by phone call.   Pt taken to lobby in view of nurses and window so he can be picked up by son.

## 2020-09-07 NOTE — ED Notes (Signed)
Lab called for phlebotomy.

## 2020-09-07 NOTE — Progress Notes (Signed)
This is a no charge note   I was called for admission due to chest pain.  Patient left hospital on [place] before I could see patient.     Ivor Costa, MD  [facility]   If 7PM-7AM, please contact night-coverage [email or web address] 09/07/2020, 11:46 AM

## 2020-09-07 NOTE — ED Notes (Signed)
ED Provider at bedside. 

## 2020-09-07 NOTE — ED Provider Notes (Signed)
[facility] Emergency Department Provider Note  ____________________________________________   Event Date/Time   First MD Initiated Contact with Patient 09/07/20 0120     (approximate)  I have reviewed the triage vital signs and the nursing notes.   HISTORY  Chief Complaint Shortness of Breath (+ right side rib pain)    HPI Travis Haynes is a 75 y.o. male with history of hypertension, hyperlipidemia, non-Hodgkin's lymphoma in remission, chronic pain who presents to the emergency department with complaints of right lower anterior lateral rib pain for the past several days.  He denies having any known injury prior to starting to have pain in this area.  Does state that 2 days ago he fell getting out of a chair and hit his back against a table but did not fall to the ground or hit his head.  Denies any back pain.  States he was already having rib pain before this.  No fevers, cough.  No left-sided chest pain.  Pain reproducible with palpation, movement.  States it does not hurt if he is staying still.  States that he has been short of breath with this pain and his sats were in the 70s at home.  Does not wear oxygen chronically.  No history of asthma, COPD, CHF.  No history of PE or DVT.  No lower extremity swelling or pain.        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   Wears dentures    Weight loss 11/2017   30 lb weight loss over 3 years    Patient Active  Problem List   Diagnosis Date Noted   History of compression fracture of spine 04/13/2020   Age related osteoporosis 07/07/2019   Sinusitis 06/17/2019   Chronic venous insufficiency 04/28/2019   Lymphedema 04/28/2019   Vitamin B12 deficiency 08/11/2018   History of non-Hodgkin's lymphoma 11/16/2017   Allergy to alpha-gal 10/08/2017   Macrocytosis 07/30/2017   Nocturnal leg cramps 09/30/2016   Advanced care planning/counseling discussion 07/22/2016   Allergic rhinitis 01/23/2015   Arteriosclerosis of coronary artery 01/16/2015   Essential hypertension 01/16/2015   Hyperlipidemia 01/16/2015   Chronic pain 01/16/2015   GERD (gastroesophageal reflux disease) 01/16/2015   Eczema intertrigo 01/19/2013   Rheumatoid arthritis ([place]) 06/30/2012    Past Surgical History:  Procedure Laterality Date   [place]   2 fusions then 1 surgery to fix the other two (lower)..plates and screws   CATARACT EXTRACTION W/PHACO Left 12/31/2018   Procedure: CATARACT EXTRACTION PHACO AND INTRAOCULAR LENS PLACEMENT ([place]) LEFT VISION BLUE;  Surgeon: Marchia Meiers, MD;  Location: [facility] ORS;  Service: Ophthalmology;  Laterality: Left;  Lot #[id number] H [place]: 01:51.7 CDE: 18.59   CYSTECTOMY     from buttocks   ESOPHAGEAL DILATION     multiple times   ESOPHAGOGASTRODUODENOSCOPY (EGD)  WITH PROPOFOL N/A 12/05/2014   Procedure: ESOPHAGOGASTRODUODENOSCOPY (EGD) WITH PROPOFOL;  Surgeon: Hulen Luster, MD;  Location: [facility] ENDOSCOPY;  Service: Gastroenterology;  Laterality: N/A;   EXCISION PARTIAL PHALANX Right 12/09/2017   Procedure: EXCISION PARTIAL PHALANX-GREAT TOE;  Surgeon: Sharlotte Alamo, DPM;  Location: [facility] ORS;  Service: Podiatry;  Laterality: Right;   FOOT SURGERY     intestine repair  1998   during biopsy his intestines were nicked requiring repair   MULTIPLE TOOTH EXTRACTIONS     NECK SURGERY  1996   fused 2 vertebrae in neck (plate plus screws)   RADIOLOGY WITH ANESTHESIA N/A  03/09/2019   Procedure: MRI WITH ANESTHESIA LUMBAR WITHOUT CONTRAST;  Surgeon: Radiologist, Medication, MD;  Location: [facility];  Service: Radiology;  Laterality: N/A;   RADIOLOGY WITH ANESTHESIA N/A 09/23/2019   Procedure: MRI WITH ANESTHESIA; LUMBAR SPINE WITHOUT CONTRAST;  Surgeon: Radiologist, Medication, MD;  Location: [facility];  Service: Radiology;  Laterality: N/A;   Reduction masseter muscle/bone extraoral      Prior to Admission medications   Medication Sig Start Date End Date Taking? Authorizing Provider  acetaminophen (TYLENOL) 500 MG tablet Take 1,000 mg by mouth every 6 (six) hours as needed for mild pain or moderate pain.     [provider]  amLODipine (NORVASC) 5 MG tablet TAKE ONE (1) TABLET BY MOUTH EACH DAY 07/25/20   Cannady, Henrine Screws T, NP  aspirin EC 81 MG tablet Take 1 tablet (81 mg total) by mouth daily. 07/22/16   Kathrine Haddock, NP  atorvastatin (LIPITOR) 10 MG tablet TAKE 1 TABLET BY MOUTH EVERY DAY 05/29/20   Cannady, Henrine Screws T, NP  Cholecalciferol (VITAMIN D-3) 1000 units CAPS Take 1,000 Units by mouth daily.    [provider]  doxycycline (VIBRA-TABS) 100 MG tablet Take 1 tablet (100 mg total) by mouth 2 (two) times daily. 04/13/20   Cannady, Henrine Screws T, NP  furosemide (LASIX) 40 MG tablet Take 1 tablet (40 mg total) by mouth as needed for edema (daily as needed only for edema). 04/13/20   Cannady, Henrine Screws T, NP  gabapentin (NEURONTIN) 100 MG capsule Take 100 mg by mouth 2 (two) times daily.     [provider]  hydrochlorothiazide (HYDRODIURIL) 25 MG tablet Take 1 tablet (25 mg total) by mouth daily. 04/13/20   Cannady, Henrine Screws T, NP  naproxen sodium (ALEVE) 220 MG tablet Take 220-440 mg by mouth See admin instructions. Take 440 mg by mouth in the morning at 12pm. and 220 mg in the evening 12 AM    [provider]  omeprazole (PRILOSEC) 20 MG capsule Take 2 capsules (40 mg total) by mouth daily. 03/27/20   Cannady, Henrine Screws T, NP  predniSONE (DELTASONE) 5  MG tablet Take 1.5 tablets by mouth daily. 08/30/19   [provider]  traMADol (ULTRAM) 50 MG tablet Take 1 tablet (50 mg total) by mouth daily as needed. Patient taking differently: Take 50 mg by mouth every 4 (four) hours. 10/15/17   Kathrine Haddock, NP  vitamin B-12 (CYANOCOBALAMIN) 1000 MCG tablet Take 1,000 mcg by mouth daily.    [provider]    Allergies Ativan [lorazepam] and Gold au 198 [gold]  Family History  Problem Relation Age of Onset   Heart disease Mother        heart attack   Arthritis Mother    Arthritis Father     Social History Social History   Tobacco Use   Smoking status: Former    Pack  years: 0.00    Types: Cigarettes    Quit date: 48    Years since quitting: 24.4   Smokeless tobacco: Never  Vaping Use   Vaping Use: Never used  Substance Use Topics   Alcohol use: No    Comment: stopped d/t cluster headaches   Drug use: No    Review of Systems Constitutional: No fever. Eyes: No visual changes. ENT: No sore throat. Cardiovascular: + right sided chest pain. Respiratory: + shortness of breath. Gastrointestinal: No nausea, vomiting, diarrhea. Genitourinary: Negative for dysuria. Musculoskeletal: Negative for back pain. Skin: Negative for rash. Neurological: Negative for focal weakness or numbness.  ____________________________________________   PHYSICAL EXAM:  VITAL SIGNS: ED Triage Vitals  Enc Vitals Group     BP 09/06/20 2221 132/84     Pulse Rate 09/06/20 2200 90     Resp 09/06/20 2200 18     Temp 09/06/20 2200 99.1 F (37.3 C)     Temp Source 09/06/20 2200 Oral     SpO2 09/06/20 2200 95 %     Weight 09/06/20 2217 147 lb 11.3 oz (67 kg)     Height 09/06/20 2217 5\' 8"  (1.727 m)     Head Circumference --      Peak Flow --      Pain Score --      Pain Loc --      Pain Edu? --      Excl. in [place]? --    CONSTITUTIONAL: Alert and oriented and responds appropriately to questions. Well-appearing;  well-nourished HEAD: Normocephalic EYES: Conjunctivae clear, pupils appear equal, EOM appear intact ENT: normal nose; moist mucous membranes NECK: Supple, normal ROM CARD: RRR; S1 and S2 appreciated; no murmurs, no clicks, no rubs, no gallops RESP: Normal chest excursion without splinting or tachypnea; breath sounds clear and equal bilaterally; no wheezes, no rhonchi, no rales, no hypoxia or respiratory distress, speaking full sentences ABD/GI: Normal bowel sounds; non-distended; soft, non-tender, no rebound, no guarding, no peritoneal signs, no hepatosplenomegaly BACK: The back appears normal EXT: Normal ROM in all joints; no deformity noted, no edema; no cyanosis SKIN: Normal color for age and race; warm; no rash on exposed skin NEURO: Moves all extremities equally PSYCH: The patient's mood and manner are appropriate.  ____________________________________________   LABS (all labs ordered are listed, but only abnormal results are displayed)  Labs Reviewed  CBC - Abnormal; Notable for the following components:      Result Value   WBC 13.2 (*)    All other components within normal limits  BASIC METABOLIC PANEL - Abnormal; Notable for the following components:   Sodium 134 (*)    Potassium 2.1 (*)    Chloride 90 (*)    Glucose, Bld 129 (*)    Creatinine, Ser 1.34 (*)    GFR, Estimated 56 (*)    All other components within normal limits  POTASSIUM - Abnormal; Notable for the following components:   Potassium 2.3 (*)    All other components within normal limits  MAGNESIUM - Abnormal; Notable for the following components:   Magnesium 1.5 (*)    All other components within normal limits  RESP PANEL BY RT-PCR (FLU A&B, COVID) ARPGX2  TROPONIN I (HIGH SENSITIVITY)   ____________________________________________  EKG   EKG Interpretation  Date/Time:  Wednesday September 06 2020 22:03:50 EDT Ventricular Rate:  113 PR Interval:  148 QRS Duration: 140 QT Interval:  376 QTC  Calculation: 515 R Axis:   255 Text Interpretation:  Sinus tachycardia Right bundle branch block Septal infarct , age undetermined Possible Lateral infarct , age undetermined Inferior infarct , age undetermined Abnormal ECG Confirmed by Pryor Curia [phone]) on 09/07/2020 1:39:31 AM         ____________________________________________  RADIOLOGY Jessie Foot Sonnie Pawloski, personally viewed and evaluated these images (plain radiographs) as part of my medical decision making, as well as reviewing the written report by the radiologist.  ED MD interpretation: Chest x-ray concerning for possible right lower rib fracture.  No pneumothorax, infiltrate.  Official radiology report(s): DG Chest 2 View  Result Date: 09/06/2020 CLINICAL DATA:  Right rib pain and shortness of breath with cough for 3 days. EXAM: CHEST - 2 VIEW COMPARISON:  06/21/2019 FINDINGS: Shallow inspiration. Heart size and pulmonary vascularity are normal. Lungs are clear. No pleural effusions. No pneumothorax. Moderate esophageal hiatal hernia behind the heart. Postoperative changes in the cervical spine. Degenerative changes in the spine and shoulders. Diffuse bone demineralization. Multiple thoracic vertebral compression deformities, progressing since previous study, some may be acute. Old right rib fractures. Possible acute fracture of the right lower rib anteriorly. Calcified and tortuous aorta. IMPRESSION: No evidence of active pulmonary disease. Multiple vertebral compression fractures, progressing since previous study. Possible acute right rib fracture. Likely osteoporosis. Electronically Signed   By: Lucienne Capers M.D.   On: 09/06/2020 22:48   CT Angio Chest PE W and/or Wo Contrast  Result Date: 09/07/2020 CLINICAL DATA:  Right side rib pain, shortness of breath, cough EXAM: CT ANGIOGRAPHY CHEST WITH CONTRAST TECHNIQUE: Multidetector CT imaging of the chest was performed using the standard protocol during bolus administration of  intravenous contrast. Multiplanar CT image reconstructions and MIPs were obtained to evaluate the vascular anatomy. CONTRAST:  3mL OMNIPAQUE IOHEXOL 350 MG/ML SOLN COMPARISON:  10/22/2013 FINDINGS: Cardiovascular: No filling defects in the pulmonary arteries to suggest pulmonary emboli. Heart is normal size. Aorta is normal caliber. Coronary artery and aortic calcifications. Mediastinum/Nodes: No mediastinal, hilar, or axillary adenopathy. Large hiatal hernia. Lungs/Pleura: Biapical scarring.  No confluent opacities. Upper Abdomen: Imaging into the upper abdomen demonstrates no acute findings. Musculoskeletal: Bilateral posterior rib fractures. No acute bony abnormality. Review of the MIP images confirms the above findings. IMPRESSION: No evidence of pulmonary embolus. Coronary artery disease. Large hiatal hernia. Aortic Atherosclerosis (ICD10-I70.0). Electronically Signed   By: Rolm Baptise M.D.   On: 09/07/2020 02:45    ____________________________________________   PROCEDURES  Procedure(s) performed (including Critical Care):  Procedures  CRITICAL CARE Performed by: Cyril Mourning Noreene Boreman   Total critical care time: 45 minutes  Critical care time was exclusive of separately billable procedures and treating other patients.  Critical care was necessary to treat or prevent imminent or life-threatening deterioration.  Critical care was time spent personally by me on the following activities: development of treatment plan with patient and/or surrogate as well as nursing, discussions with consultants, evaluation of patient's response to treatment, examination of patient, obtaining history from patient or surrogate, ordering and performing treatments and interventions, ordering and review of laboratory studies, ordering and review of radiographic studies, pulse oximetry and re-evaluation of patient's condition.  ____________________________________________   INITIAL IMPRESSION / ASSESSMENT AND PLAN / ED  COURSE  As part of my medical decision making, I reviewed the following data within the [facility] notes reviewed and incorporated, Labs reviewed , EKG interpreted , Old EKG reviewed, Old chart reviewed, Radiograph reviewed , Discussed with admitting physician , and Notes from prior ED visits  Patient here with right-sided chest pain.  Reports hypoxia of the 70s at home.  Not hypoxic here but is intermittently tachycardic and tachypneic.  Could be from either a rib fracture but differential also includes pneumonia not seen on chest x-ray, PE.  We will proceed with CTA of the chest.  He declines pain medicine at this time.  I do not think this is his anginal equivalent.  EKG shows a new right bundle branch block but does not appear to be ischemic.  Troponin normal at 16.  Patient also found to have a potassium level of 2.1 that was rechecked and is still 2.3.  Magnesium level also low at 1.5.  He is on hydrochlorothiazide and Lasix which I suspect is why he has these electrolyte derangements.  Given his new right bundle branch block, have offered and recommended admission for electrolyte replacement but at this time he declines.  Will give IV and oral potassium, IV magnesium and continue to closely monitor.  ED PROGRESS  Patient CT scan shows no acute rib fracture, pneumonia, pneumothorax, edema.  No PE.  He has not been hypoxic here.  Again pain seems very atypical for ACS.  Discussed with him again given his electrolyte derangements and EKG changes and I strongly recommend admission to the hospital as it will take hours for his electrolytes to be appropriately replaced.  He now agrees to admission.  Will discuss with hospitalist.  3:07 AM Discussed patient's case with hospitalist, Dr. Sidney Ace.  I have recommended admission and patient (and family if present) agree with this plan. Admitting physician will place admission orders.   I reviewed all nursing notes, vitals,  pertinent previous records and reviewed/interpreted all EKGs, lab and urine results, imaging (as available).  ____________________________________________   FINAL CLINICAL IMPRESSION(S) / ED DIAGNOSES  Final diagnoses:  Hypokalemia  Hypomagnesemia  Right-sided chest pain     ED Discharge Orders     None       *Please note:  Travis Haynes was evaluated in Emergency Department on 09/07/2020 for the symptoms described in the history of present illness. He was evaluated in the context of the global COVID-19 pandemic, which necessitated consideration that the patient might be at risk for infection with the SARS-CoV-2 virus that causes COVID-19. Institutional protocols and algorithms that pertain to the evaluation of patients at risk for COVID-19 are in a state of rapid change based on information released by regulatory bodies including the CDC and federal and state organizations. These policies and algorithms were followed during the patient's care in the ED.  Some ED evaluations and interventions may be delayed as a result of limited staffing during and the pandemic.*   Note:  This document was prepared using Dragon voice recognition software and may include unintentional dictation errors.    Estiven Kohan, Delice Bison, DO 09/07/20 [phone]

## 2020-09-11 ENCOUNTER — Telehealth: Payer: Self-pay

## 2020-09-11 NOTE — Telephone Encounter (Signed)
Called pt to schedule an appt no answer no vm   Copied from [place] [phone]. Topic: General - Other >> Sep 11, 2020  2:24 PM Tessa Lerner A wrote: Reason for CRM: Patient would like to be contacted by a member of clinical staff regarding their potasium levels   Patient was seen at [facility] on 09/06/20 for concerns related to their oxygen levels and pain when they would lay on their right side  Patient left against medical advise and would like to discuss additional concerns further   Patient shares that they didn't receive a discharge summary or instructions for care

## 2020-10-05 NOTE — Telephone Encounter (Signed)
Called pt no answer left vm 

## 2020-10-05 NOTE — Telephone Encounter (Signed)
Called number on file pt's wife answered not on dpr advised her to have pt call back.

## 2020-10-06 ENCOUNTER — Encounter: Payer: Self-pay | Admitting: Nurse Practitioner

## 2020-10-06 ENCOUNTER — Ambulatory Visit (INDEPENDENT_AMBULATORY_CARE_PROVIDER_SITE_OTHER): Payer: Medicare Other | Admitting: Nurse Practitioner

## 2020-10-06 ENCOUNTER — Other Ambulatory Visit: Payer: Self-pay

## 2020-10-06 VITALS — BP 123/84 | HR 90 | Temp 98.0°F

## 2020-10-06 DIAGNOSIS — M069 Rheumatoid arthritis, unspecified: Secondary | ICD-10-CM

## 2020-10-06 DIAGNOSIS — E538 Deficiency of other specified B group vitamins: Secondary | ICD-10-CM | POA: Diagnosis not present

## 2020-10-06 DIAGNOSIS — I2510 Atherosclerotic heart disease of native coronary artery without angina pectoris: Secondary | ICD-10-CM

## 2020-10-06 DIAGNOSIS — E876 Hypokalemia: Secondary | ICD-10-CM | POA: Diagnosis not present

## 2020-10-06 DIAGNOSIS — R059 Cough, unspecified: Secondary | ICD-10-CM | POA: Insufficient documentation

## 2020-10-06 DIAGNOSIS — M810 Age-related osteoporosis without current pathological fracture: Secondary | ICD-10-CM | POA: Diagnosis not present

## 2020-10-06 DIAGNOSIS — I5042 Chronic combined systolic (congestive) and diastolic (congestive) heart failure: Secondary | ICD-10-CM | POA: Diagnosis not present

## 2020-10-06 DIAGNOSIS — G894 Chronic pain syndrome: Secondary | ICD-10-CM | POA: Diagnosis not present

## 2020-10-06 DIAGNOSIS — I10 Essential (primary) hypertension: Secondary | ICD-10-CM

## 2020-10-06 DIAGNOSIS — Z8572 Personal history of non-Hodgkin lymphomas: Secondary | ICD-10-CM

## 2020-10-06 DIAGNOSIS — E782 Mixed hyperlipidemia: Secondary | ICD-10-CM | POA: Diagnosis not present

## 2020-10-06 MED ORDER — ATORVASTATIN CALCIUM 10 MG PO TABS: 10.0000 mg | ORAL_TABLET | Freq: Every day | ORAL | 4 refills | Status: AC

## 2020-10-06 MED ORDER — ALBUTEROL SULFATE HFA 108 (90 BASE) MCG/ACT IN AERS: 2.0000 | INHALATION_SPRAY | Freq: Four times a day (QID) | RESPIRATORY_TRACT | 4 refills | Status: AC | PRN

## 2020-10-06 MED ORDER — AMLODIPINE BESYLATE 5 MG PO TABS: ORAL_TABLET | ORAL | 4 refills | Status: AC

## 2020-10-06 MED ORDER — AMOXICILLIN-POT CLAVULANATE 875-125 MG PO TABS: 1.0000 | ORAL_TABLET | Freq: Two times a day (BID) | ORAL | 0 refills | Status: AC

## 2020-10-06 NOTE — Patient Instructions (Signed)

## 2020-10-06 NOTE — Assessment & Plan Note (Signed)
Check B12 level today and continue daily supplement.

## 2020-10-06 NOTE — Assessment & Plan Note (Signed)
History of in 1988, in remission.  Check CBC today.

## 2020-10-06 NOTE — Assessment & Plan Note (Signed)
Chronic, ongoing.  Euvolemic today.  Followed by cardiology, with no recent visit, recommend he return to them for visit.  At this time continue current medication regimen and recommend: - Reminded to call for an overnight weight gain of >2 pounds or a weekly weight gain of >5 pounds - not adding salt to food and read food labels. Reviewed the importance of keeping daily sodium intake to 2000mg  daily.

## 2020-10-06 NOTE — Assessment & Plan Note (Signed)
Continue to collaborate with rheumatology [facility].  CBC and CMP today.

## 2020-10-06 NOTE — Progress Notes (Signed)
BP 123/84   Pulse 90   Temp 98 F (36.7 C) (Oral)   SpO2 96%    Subjective:    Patient ID: Travis Haynes, male    DOB: 1945-09-08, 75 y.o.   MRN: [id number]  HPI: Travis Haynes is a 75 y.o. male  Chief Complaint  Patient presents with   Hyperlipidemia   Hypertension   Rheumatoid Arthritis   HYPERTENSION / HYPERLIPIDEMIA Continues on Atorvastatin, Amlodipine, and HCTZ.  Has history of non-Hodgkin's lymphoma that is in remission -- in 1988.  He is a past smoker, quit 40 years ago.  Smoked for 10 years.  He does cough up a lot of yellow sputum when he gets up in morning.  Has been a little more cough since leaving ER, but Covid has been negative he reports.  Last seen by cardiology on 09/13/19.  Last echo on 10/25/19 noted EF 40-45%.  Recently went to ER due to having some shortness of breath and his O2 sat was in 70's at home.  Reports he was having some rib pain at the time.  His potassium in ER was 2.1 and magnesium was 1.5.  Repeat Mag noted 1.8 and K+ 3.0. Satisfied with current treatment? yes Duration of hypertension: chronic BP monitoring frequency: weekly BP range: 115-125/70-80 BP medication side effects: no Duration of hyperlipidemia: chronic Cholesterol medication side effects: no Cholesterol supplements: none Medication compliance: good compliance Aspirin: yes Recent stressors: no Recurrent headaches: no Visual changes: no Palpitations: no Dyspnea: no Chest pain: no Lower extremity edema: no Dizzy/lightheaded: no   RHEUMATOID ARTHRITIS: Visits with specialist at [facility], last seen 12/29/2019.  He reports he stopped taking Methotrexate "a long while ago", but continues on Prednisone.  Uses Tramadol PRN for discomfort + Gabapentin TID.    Has history of multiple vertebral compression fractures.  In ER recently was noted to have a possible acute right rib fracture and osteoporosis + a large hiatal hernia.  He was ordered Fosamax by rheumatology, but did not start taking  it as he was concerned about side effects.   He is getting injections in back at this time for pain.  Relevant past medical, surgical, family and social history reviewed and updated as indicated. Interim medical history since our last visit reviewed. Allergies and medications reviewed and updated.  Review of Systems  Constitutional:  Negative for activity change, diaphoresis, fatigue and fever.  Respiratory:  Negative for cough, chest tightness, shortness of breath and wheezing.   Cardiovascular:  Negative for chest pain, palpitations and leg swelling.  Gastrointestinal: Negative.   Endocrine: Negative for cold intolerance, heat intolerance, polydipsia, polyphagia and polyuria.  Musculoskeletal:  Positive for arthralgias.  Neurological: Negative.   Psychiatric/Behavioral: Negative.     Per HPI unless specifically indicated above     Objective:    BP 123/84   Pulse 90   Temp 98 F (36.7 C) (Oral)   SpO2 96%   Wt Readings from Last 3 Encounters:  09/06/20 147 lb 11.3 oz (67 kg)  11/08/19 148 lb (67.1 kg)  09/23/19 145 lb (65.8 kg)    Physical Exam Vitals and nursing note reviewed.  Constitutional:      Appearance: He is well-developed.  HENT:     Head: Normocephalic and atraumatic.     Right Ear: Hearing normal. No drainage.     Left Ear: Hearing normal. No drainage.     Mouth/Throat:     Pharynx: Uvula midline.  Eyes:  General: Lids are normal.        Right eye: No discharge.        Left eye: No discharge.     Conjunctiva/sclera: Conjunctivae normal.     Pupils: Pupils are equal, round, and reactive to light.  Neck:     Thyroid: No thyromegaly.     Vascular: No carotid bruit or JVD.     Trachea: Trachea normal.  Cardiovascular:     Rate and Rhythm: Normal rate and regular rhythm.     Heart sounds: Normal heart sounds, S1 normal and S2 normal. No murmur heard.   No gallop.  Pulmonary:     Effort: Pulmonary effort is normal. No accessory muscle usage or  respiratory distress.     Breath sounds: Wheezing present.     Comments: Noteable expiratory wheezes throughout, no SOB with talking and no rhonchi. Abdominal:     General: Bowel sounds are normal.     Palpations: Abdomen is soft. There is no hepatomegaly or splenomegaly.  Musculoskeletal:     Cervical back: Normal range of motion and neck supple.     Right lower leg: No edema.     Left lower leg: No edema.     Comments: Swan neck deformity fingers bilateral hands.  Walker assistance while mobile.  Skin:    General: Skin is warm and dry.     Capillary Refill: Capillary refill takes less than 2 seconds.     Findings: No rash.  Neurological:     Mental Status: He is alert and oriented to person, place, and time.     Deep Tendon Reflexes: Reflexes are normal and symmetric.  Psychiatric:        Mood and Affect: Mood normal.        Behavior: Behavior normal.        Thought Content: Thought content normal.        Judgment: Judgment normal.    Results for orders placed or performed during the hospital encounter of 09/07/20  Resp Panel by RT-PCR (Flu A&B, Covid) Nasopharyngeal Swab   Specimen: Nasopharyngeal Swab; Nasopharyngeal(NP) swabs in vial transport medium  Result Value Ref Range   SARS Coronavirus 2 by RT PCR NEGATIVE NEGATIVE   Influenza A by PCR NEGATIVE NEGATIVE   Influenza B by PCR NEGATIVE NEGATIVE  CBC  Result Value Ref Range   WBC 13.2 (H) 4.0 - 10.5 K/uL   RBC 4.47 4.22 - 5.81 MIL/uL   Hemoglobin 14.3 13.0 - 17.0 g/dL   HCT 41.2 39.0 - 52.0 %   MCV 92.2 80.0 - 100.0 fL   MCH 32.0 26.0 - 34.0 pg   MCHC 34.7 30.0 - 36.0 g/dL   RDW 13.2 11.5 - 15.5 %   Platelets 247 150 - 400 K/uL   nRBC 0.0 0.0 - 0.2 %  Basic metabolic panel  Result Value Ref Range   Sodium 134 (L) 135 - 145 mmol/L   Potassium 2.1 (LL) 3.5 - 5.1 mmol/L   Chloride 90 (L) 98 - 111 mmol/L   CO2 30 22 - 32 mmol/L   Glucose, Bld 129 (H) 70 - 99 mg/dL   BUN 20 8 - 23 mg/dL   Creatinine, Ser 1.34  (H) 0.61 - 1.24 mg/dL   Calcium 9.3 8.9 - 10.3 mg/dL   GFR, Estimated 56 (L) >60 mL/min   Anion gap 14 5 - 15  Potassium  Result Value Ref Range   Potassium 2.3 (LL) 3.5 - 5.1 mmol/L  Magnesium  Result Value Ref Range   Magnesium 1.5 (L) 1.7 - 2.4 mg/dL  Basic metabolic panel  Result Value Ref Range   Sodium 133 (L) 135 - 145 mmol/L   Potassium 3.0 (L) 3.5 - 5.1 mmol/L   Chloride 93 (L) 98 - 111 mmol/L   CO2 28 22 - 32 mmol/L   Glucose, Bld 103 (H) 70 - 99 mg/dL   BUN 17 8 - 23 mg/dL   Creatinine, Ser 1.08 0.61 - 1.24 mg/dL   Calcium 8.6 (L) 8.9 - 10.3 mg/dL   GFR, Estimated >60 >60 mL/min   Anion gap 12 5 - 15  Magnesium  Result Value Ref Range   Magnesium 1.8 1.7 - 2.4 mg/dL  Troponin I (High Sensitivity)  Result Value Ref Range   Troponin I (High Sensitivity) 16 <18 ng/L      Assessment & Plan:   Problem List Items Addressed This Visit       Cardiovascular and Mediastinum   Essential hypertension    Chronic, ongoing.  BP below goal on home readings and in office today.  Recommend she monitor BP at least a few mornings a week at home and document.  DASH diet at home.  Continue current medication regimen and adjust as needed.  Labs today: TSH, CBC, CMP.  Return in 6 months.        Relevant Medications   atorvastatin (LIPITOR) 10 MG tablet   amLODipine (NORVASC) 5 MG tablet   Other Relevant Orders   CBC with Differential/Platelet   Comprehensive metabolic panel   TSH   CAD (coronary artery disease)    Chronic, ongoing, followed by cardiology.  Recommend he return for visit.  Continue current medication regimen.       Relevant Medications   atorvastatin (LIPITOR) 10 MG tablet   amLODipine (NORVASC) 5 MG tablet   Chronic combined systolic and diastolic CHF (congestive heart failure) (HCC) - Primary    Chronic, ongoing.  Euvolemic today.  Followed by cardiology, with no recent visit, recommend he return to them for visit.  At this time continue current  medication regimen and recommend: - Reminded to call for an overnight weight gain of >2 pounds or a weekly weight gain of >5 pounds - not adding salt to food and read food labels. Reviewed the importance of keeping daily sodium intake to 2000mg  daily.       Relevant Medications   atorvastatin (LIPITOR) 10 MG tablet   amLODipine (NORVASC) 5 MG tablet     Musculoskeletal and Integument   Rheumatoid arthritis ([place])    Continue to collaborate with rheumatology [facility].  CBC and CMP today.       Age related osteoporosis    Noted on imaging, is not taking Fosamax as recommended by rheumatology, highly recommend he start this medication and instructed on how to use.  Check Vit D level today.       Relevant Orders   VITAMIN D 25 Hydroxy (Vit-D Deficiency, Fractures)     Other   Hyperlipidemia    Chronic, ongoing.  Continue current medication regimen and adjust as needed.  Lipid and CMP today.       Relevant Medications   atorvastatin (LIPITOR) 10 MG tablet   amLODipine (NORVASC) 5 MG tablet   Other Relevant Orders   Lipid Panel w/o Chol/HDL Ratio   Chronic pain    Followed by rheumatology at [facility] and medications prescribed by them, continue this collaboration.       History  of non-Hodgkin's lymphoma    History of in 1988, in remission.  Check CBC today.       Vitamin B12 deficiency    Check B12 level today and continue daily supplement.       Relevant Orders   Vitamin B12   Cough    Reports ongoing productive cough since ER visit, no PNA noted on imaging.  Was a smoker for long period, quit 40 years ago.  Possible bronchitis vs exacerbation.  Will send in Albuterol inhaler to use as needed and Augmentin for treatment due to ongoing cough.  Recommend if ongoing or worsening then return to office and will obtain imaging.       RESOLVED: Hypomagnesemia   Relevant Orders   Magnesium   Other Visit Diagnoses     Hypokalemia       History of recent low levels,  recheck today and start supplement as needed.        Follow up plan: Return in about 6 months (around 04/08/2021) for RA, HTN/HLD, GERD.

## 2020-10-06 NOTE — Assessment & Plan Note (Signed)
Followed by rheumatology at [facility] and medications prescribed by them, continue this collaboration.

## 2020-10-06 NOTE — Assessment & Plan Note (Signed)
Chronic, ongoing.  BP below goal on home readings and in office today.  Recommend she monitor BP at least a few mornings a week at home and document.  DASH diet at home.  Continue current medication regimen and adjust as needed.  Labs today: TSH, CBC, CMP.  Return in 6 months.

## 2020-10-06 NOTE — Assessment & Plan Note (Addendum)
Noted on imaging, is not taking Fosamax as recommended by rheumatology, highly recommend he start this medication and instructed on how to use.  Check Vit D level today.

## 2020-10-06 NOTE — Assessment & Plan Note (Signed)
Chronic, ongoing, followed by cardiology.  Recommend he return for visit.  Continue current medication regimen.

## 2020-10-06 NOTE — Assessment & Plan Note (Signed)
Chronic, ongoing.  Continue current medication regimen and adjust as needed.  Lipid and CMP today. 

## 2020-10-06 NOTE — Assessment & Plan Note (Signed)
Reports ongoing productive cough since ER visit, no PNA noted on imaging.  Was a smoker for long period, quit 40 years ago.  Possible bronchitis vs exacerbation.  Will send in Albuterol inhaler to use as needed and Augmentin for treatment due to ongoing cough.  Recommend if ongoing or worsening then return to office and will obtain imaging.

## 2020-10-07 ENCOUNTER — Other Ambulatory Visit: Payer: Self-pay | Admitting: Nurse Practitioner

## 2020-10-07 DIAGNOSIS — E876 Hypokalemia: Secondary | ICD-10-CM

## 2020-10-07 DIAGNOSIS — D729 Disorder of white blood cells, unspecified: Secondary | ICD-10-CM

## 2020-10-07 DIAGNOSIS — R7309 Other abnormal glucose: Secondary | ICD-10-CM

## 2020-10-07 LAB — LIPID PANEL W/O CHOL/HDL RATIO
Cholesterol, Total: 168 mg/dL (ref 100–199)
HDL: 90 mg/dL (ref >39)
LDL Chol Calc (NIH): 57 mg/dL (ref 0–99)
Triglycerides: 123 mg/dL (ref 0–149)
VLDL Cholesterol Cal: 21 mg/dL (ref 5–40)

## 2020-10-07 LAB — CBC WITH DIFFERENTIAL/PLATELET
Basophils Absolute: 0.1 10*3/uL (ref 0.0–0.2)
Basos: 1 %
EOS (ABSOLUTE): 0 10*3/uL (ref 0.0–0.4)
Eos: 0 %
Hematocrit: 41.9 % (ref 37.5–51.0)
Hemoglobin: 14.7 g/dL (ref 13.0–17.7)
Immature Grans (Abs): 0.1 10*3/uL (ref 0.0–0.1)
Immature Granulocytes: 1 %
Lymphocytes Absolute: 1.3 10*3/uL (ref 0.7–3.1)
Lymphs: 12 %
MCH: 33 pg (ref 26.6–33.0)
MCHC: 35.1 g/dL (ref 31.5–35.7)
MCV: 94 fL (ref 79–97)
Monocytes Absolute: 0.7 10*3/uL (ref 0.1–0.9)
Monocytes: 7 %
Neutrophils Absolute: 8.8 10*3/uL — ABNORMAL HIGH (ref 1.4–7.0)
Neutrophils: 79 %
Platelets: 252 10*3/uL (ref 150–450)
RBC: 4.46 x10E6/uL (ref 4.14–5.80)
RDW: 13.8 % (ref 11.6–15.4)
WBC: 10.9 10*3/uL — ABNORMAL HIGH (ref 3.4–10.8)

## 2020-10-07 LAB — COMPREHENSIVE METABOLIC PANEL
ALT: 11 IU/L (ref 0–44)
AST: 16 IU/L (ref 0–40)
Albumin/Globulin Ratio: 1.8 (ref 1.2–2.2)
Albumin: 4.4 g/dL (ref 3.7–4.7)
Alkaline Phosphatase: 65 IU/L (ref 44–121)
BUN/Creatinine Ratio: 15 (ref 10–24)
BUN: 17 mg/dL (ref 8–27)
Bilirubin Total: 0.5 mg/dL (ref 0.0–1.2)
CO2: 22 mmol/L (ref 20–29)
Calcium: 9.1 mg/dL (ref 8.6–10.2)
Chloride: 95 mmol/L — ABNORMAL LOW (ref 96–106)
Creatinine, Ser: 1.12 mg/dL (ref 0.76–1.27)
Globulin, Total: 2.5 g/dL (ref 1.5–4.5)
Glucose: 126 mg/dL — ABNORMAL HIGH (ref 65–99)
Potassium: 3.4 mmol/L — ABNORMAL LOW (ref 3.5–5.2)
Sodium: 138 mmol/L (ref 134–144)
Total Protein: 6.9 g/dL (ref 6.0–8.5)
eGFR: 69 mL/min/{1.73_m2} (ref >59)

## 2020-10-07 LAB — VITAMIN B12: Vitamin B-12: >2000 pg/mL — ABNORMAL HIGH (ref 232–1245)

## 2020-10-07 LAB — VITAMIN D 25 HYDROXY (VIT D DEFICIENCY, FRACTURES): Vit D, 25-Hydroxy: 46.5 ng/mL (ref 30.0–100.0)

## 2020-10-07 LAB — TSH: TSH: 1.95 u[IU]/mL (ref 0.450–4.500)

## 2020-10-07 LAB — MAGNESIUM: Magnesium: 1.7 mg/dL (ref 1.6–2.3)

## 2020-10-07 MED ORDER — HYDROCHLOROTHIAZIDE 12.5 MG PO TABS: 12.5000 mg | ORAL_TABLET | Freq: Every day | ORAL | 4 refills | Status: AC

## 2020-10-07 NOTE — Progress Notes (Signed)
Good morning, please let Travis Haynes know his labs have returned and only a few concerns on these: - White blood cell count and neutrophils mildly elevated, although he is on daily Prednisone and this is common finding with this.  We will continue to monitor.  I would recommend taking antibiotic ordered too, as this could also be infection related with his current cough.  If no improvement in cough please return to office. - Glucose, sugar, mildly elevated, this can also be related to chronic Prednisone use and next visit we will check A1c to ensure no diabetes presenting with this.  Potassium remains a little on low side.  This could be related to Hydrochlorothiazide.  I would recommend cutting this in half and taking 12.5 MG instead of 25 MG daily.  If you have 25 MG tablets left, cut these in 1/2 and I will send in new dosing.  Also ensure increasing potassium rich foods in diet, like bananas, dried fruit, beans.  - Remainder of labs are stable.  Any questions?  Please schedule a 4 week lab visit only so we can recheck potassium, white blood cell count, and sugar level.  Thank you. Keep being awesome!!  Thank you for allowing me to participate in your care.  I appreciate you. Kindest regards, Kyndel Egger

## 2020-11-03 ENCOUNTER — Other Ambulatory Visit: Payer: Medicare Other

## 2020-11-03 ENCOUNTER — Other Ambulatory Visit: Payer: Self-pay

## 2020-11-03 DIAGNOSIS — E876 Hypokalemia: Secondary | ICD-10-CM | POA: Diagnosis not present

## 2020-11-03 DIAGNOSIS — R7309 Other abnormal glucose: Secondary | ICD-10-CM | POA: Diagnosis not present

## 2020-11-03 DIAGNOSIS — D729 Disorder of white blood cells, unspecified: Secondary | ICD-10-CM | POA: Diagnosis not present

## 2020-11-04 LAB — CBC WITH DIFFERENTIAL/PLATELET
Basophils Absolute: 0.1 10*3/uL (ref 0.0–0.2)
Basos: 1 %
EOS (ABSOLUTE): 0.2 10*3/uL (ref 0.0–0.4)
Eos: 2 %
Hematocrit: 43.7 % (ref 37.5–51.0)
Hemoglobin: 14.8 g/dL (ref 13.0–17.7)
Immature Grans (Abs): 0.1 10*3/uL (ref 0.0–0.1)
Immature Granulocytes: 1 %
Lymphocytes Absolute: 2.5 10*3/uL (ref 0.7–3.1)
Lymphs: 24 %
MCH: 32.2 pg (ref 26.6–33.0)
MCHC: 33.9 g/dL (ref 31.5–35.7)
MCV: 95 fL (ref 79–97)
Monocytes Absolute: 1.4 10*3/uL — ABNORMAL HIGH (ref 0.1–0.9)
Monocytes: 13 %
Neutrophils Absolute: 6.5 10*3/uL (ref 1.4–7.0)
Neutrophils: 59 %
Platelets: 249 10*3/uL (ref 150–450)
RBC: 4.6 x10E6/uL (ref 4.14–5.80)
RDW: 13.9 % (ref 11.6–15.4)
WBC: 10.7 10*3/uL (ref 3.4–10.8)

## 2020-11-04 LAB — HEMOGLOBIN A1C
Est. average glucose Bld gHb Est-mCnc: 117 mg/dL
Hgb A1c MFr Bld: 5.7 % — ABNORMAL HIGH (ref 4.8–5.6)

## 2020-11-04 LAB — POTASSIUM: Potassium: 3.5 mmol/L (ref 3.5–5.2)

## 2020-11-04 NOTE — Progress Notes (Signed)
Good morning, please let [name] know labs have returned.  Potassium level now in normal range, but continue to recommend getting lots of potassium rich food in diet daily like bananas and avocados.  CBC shows no further elevation in white blood cell count.  The A1C is the diabetes testing we talked about, this looks at your blood sugars over the past 3 months and turns the average into a number.  Your number is 5.7%, meaning you are prediabetic.  Any number 5.7 to 6.4 is considered prediabetes and any number 6.5 or greater is considered diabetes.   I would recommend heavy focus on decreasing foods high in sugar and your intake of things like bread products, pasta, and rice.  The American Diabetes Association online has a large amount of information on diet changes to make.  We will recheck this number in next visit to ensure you are not continuing to trend upwards and move into diabetes.  Have a good day. Keep being awesome!!  Thank you for allowing me to participate in your care.  I appreciate you. Kindest regards, Judyth Demarais

## 2020-11-08 ENCOUNTER — Ambulatory Visit: Payer: Medicare Other

## 2020-11-13 ENCOUNTER — Ambulatory Visit: Payer: Medicare Other

## 2020-12-27 DIAGNOSIS — Z1159 Encounter for screening for other viral diseases: Secondary | ICD-10-CM | POA: Diagnosis not present

## 2020-12-27 DIAGNOSIS — Z5181 Encounter for therapeutic drug level monitoring: Secondary | ICD-10-CM | POA: Diagnosis not present

## 2020-12-27 DIAGNOSIS — M85841 Other specified disorders of bone density and structure, right hand: Secondary | ICD-10-CM | POA: Diagnosis not present

## 2020-12-27 DIAGNOSIS — M25421 Effusion, right elbow: Secondary | ICD-10-CM | POA: Diagnosis not present

## 2020-12-27 DIAGNOSIS — Z79899 Other long term (current) drug therapy: Secondary | ICD-10-CM | POA: Diagnosis not present

## 2020-12-27 DIAGNOSIS — I10 Essential (primary) hypertension: Secondary | ICD-10-CM | POA: Diagnosis not present

## 2020-12-27 DIAGNOSIS — M069 Rheumatoid arthritis, unspecified: Secondary | ICD-10-CM | POA: Diagnosis not present

## 2020-12-27 DIAGNOSIS — M25422 Effusion, left elbow: Secondary | ICD-10-CM | POA: Diagnosis not present

## 2020-12-27 DIAGNOSIS — Z23 Encounter for immunization: Secondary | ICD-10-CM | POA: Diagnosis not present

## 2020-12-27 DIAGNOSIS — M0579 Rheumatoid arthritis with rheumatoid factor of multiple sites without organ or systems involvement: Secondary | ICD-10-CM | POA: Diagnosis not present

## 2020-12-27 DIAGNOSIS — M85842 Other specified disorders of bone density and structure, left hand: Secondary | ICD-10-CM | POA: Diagnosis not present

## 2020-12-27 DIAGNOSIS — Z7982 Long term (current) use of aspirin: Secondary | ICD-10-CM | POA: Diagnosis not present

## 2020-12-29 DIAGNOSIS — M48062 Spinal stenosis, lumbar region with neurogenic claudication: Secondary | ICD-10-CM | POA: Diagnosis not present

## 2020-12-29 DIAGNOSIS — M5416 Radiculopathy, lumbar region: Secondary | ICD-10-CM | POA: Diagnosis not present

## 2021-02-21 DIAGNOSIS — M0579 Rheumatoid arthritis with rheumatoid factor of multiple sites without organ or systems involvement: Secondary | ICD-10-CM | POA: Diagnosis not present

## 2021-02-21 DIAGNOSIS — Z79899 Other long term (current) drug therapy: Secondary | ICD-10-CM | POA: Diagnosis not present

## 2021-03-07 ENCOUNTER — Encounter: Payer: Self-pay | Admitting: Nurse Practitioner

## 2021-03-07 ENCOUNTER — Ambulatory Visit (INDEPENDENT_AMBULATORY_CARE_PROVIDER_SITE_OTHER): Payer: Medicare Other | Admitting: Nurse Practitioner

## 2021-03-07 ENCOUNTER — Other Ambulatory Visit: Payer: Self-pay

## 2021-03-07 VITALS — BP 110/70 | HR 96 | Temp 97.6°F | Wt 130.0 lb

## 2021-03-07 DIAGNOSIS — R059 Cough, unspecified: Secondary | ICD-10-CM | POA: Diagnosis not present

## 2021-03-07 DIAGNOSIS — M47895 Other spondylosis, thoracolumbar region: Secondary | ICD-10-CM | POA: Diagnosis not present

## 2021-03-07 DIAGNOSIS — K449 Diaphragmatic hernia without obstruction or gangrene: Secondary | ICD-10-CM | POA: Diagnosis not present

## 2021-03-07 DIAGNOSIS — I2510 Atherosclerotic heart disease of native coronary artery without angina pectoris: Secondary | ICD-10-CM | POA: Diagnosis not present

## 2021-03-07 DIAGNOSIS — Z87891 Personal history of nicotine dependence: Secondary | ICD-10-CM | POA: Diagnosis not present

## 2021-03-07 DIAGNOSIS — R1900 Intra-abdominal and pelvic swelling, mass and lump, unspecified site: Secondary | ICD-10-CM | POA: Diagnosis not present

## 2021-03-07 DIAGNOSIS — K469 Unspecified abdominal hernia without obstruction or gangrene: Secondary | ICD-10-CM

## 2021-03-07 DIAGNOSIS — L03311 Cellulitis of abdominal wall: Secondary | ICD-10-CM | POA: Diagnosis not present

## 2021-03-07 DIAGNOSIS — U071 COVID-19: Secondary | ICD-10-CM | POA: Diagnosis not present

## 2021-03-07 DIAGNOSIS — I10 Essential (primary) hypertension: Secondary | ICD-10-CM | POA: Diagnosis not present

## 2021-03-07 DIAGNOSIS — L539 Erythematous condition, unspecified: Secondary | ICD-10-CM | POA: Diagnosis not present

## 2021-03-07 DIAGNOSIS — K439 Ventral hernia without obstruction or gangrene: Secondary | ICD-10-CM | POA: Diagnosis not present

## 2021-03-07 DIAGNOSIS — Z7952 Long term (current) use of systemic steroids: Secondary | ICD-10-CM | POA: Diagnosis not present

## 2021-03-07 DIAGNOSIS — S2249XA Multiple fractures of ribs, unspecified side, initial encounter for closed fracture: Secondary | ICD-10-CM | POA: Diagnosis not present

## 2021-03-07 DIAGNOSIS — I110 Hypertensive heart disease with heart failure: Secondary | ICD-10-CM | POA: Diagnosis not present

## 2021-03-07 DIAGNOSIS — R918 Other nonspecific abnormal finding of lung field: Secondary | ICD-10-CM | POA: Diagnosis not present

## 2021-03-07 DIAGNOSIS — I5022 Chronic systolic (congestive) heart failure: Secondary | ICD-10-CM | POA: Diagnosis not present

## 2021-03-07 DIAGNOSIS — Z79899 Other long term (current) drug therapy: Secondary | ICD-10-CM | POA: Diagnosis not present

## 2021-03-07 DIAGNOSIS — R110 Nausea: Secondary | ICD-10-CM | POA: Diagnosis not present

## 2021-03-07 DIAGNOSIS — C8330 Diffuse large B-cell lymphoma, unspecified site: Secondary | ICD-10-CM | POA: Diagnosis not present

## 2021-03-07 DIAGNOSIS — Z8261 Family history of arthritis: Secondary | ICD-10-CM | POA: Diagnosis not present

## 2021-03-07 DIAGNOSIS — Z7982 Long term (current) use of aspirin: Secondary | ICD-10-CM | POA: Diagnosis not present

## 2021-03-07 DIAGNOSIS — M1990 Unspecified osteoarthritis, unspecified site: Secondary | ICD-10-CM | POA: Diagnosis not present

## 2021-03-07 DIAGNOSIS — K219 Gastro-esophageal reflux disease without esophagitis: Secondary | ICD-10-CM | POA: Diagnosis not present

## 2021-03-07 NOTE — Progress Notes (Signed)
BP 110/70    Pulse 96    Temp 97.6 F (36.4 C) (Oral)    Wt 130 lb (59 kg)    SpO2 (!) 86%    BMI 19.77 kg/m    Subjective:    Patient ID: Travis Haynes, male    DOB: 04/24/1945, 75 y.o.   MRN: [id number]  HPI: Travis Haynes is a 75 y.o. male  Chief Complaint  Patient presents with   Hernia    Pt states he thinks he has a hernia in his stomach, states he first noticed it last week    Nasal Congestion    Pt states he has been having issues with congestion for the last month. States it is mainly in his chest.     Patient states he has been dizzy and nauseated over the last week it has worsened.  He has mostly just laid in bed for the last week. He doesn't have much of an appetite.  Patient states he started the Sulfasalazine two weeks ago.    Relevant past medical, surgical, family and social history reviewed and updated as indicated. Interim medical history since our last visit reviewed. Allergies and medications reviewed and updated.  Review of Systems  Constitutional:  Positive for appetite change, fatigue and unexpected weight change.  Respiratory:  Positive for shortness of breath.   Neurological:  Positive for dizziness.   Per HPI unless specifically indicated above     Objective:    BP 110/70    Pulse 96    Temp 97.6 F (36.4 C) (Oral)    Wt 130 lb (59 kg)    SpO2 (!) 86%    BMI 19.77 kg/m   Wt Readings from Last 3 Encounters:  03/07/21 130 lb (59 kg)  09/06/20 147 lb 11.3 oz (67 kg)  11/08/19 148 lb (67.1 kg)    Physical Exam Vitals and nursing note reviewed.  Constitutional:      General: He is not in acute distress.    Appearance: Normal appearance. He is not ill-appearing, toxic-appearing or diaphoretic.  HENT:     Head: Normocephalic.     Right Ear: External ear normal.     Left Ear: External ear normal.     Nose: Nose normal. No congestion or rhinorrhea.     Mouth/Throat:     Mouth: Mucous membranes are moist.  Eyes:     General:        Right eye:  No discharge.        Left eye: No discharge.     Extraocular Movements: Extraocular movements intact.     Conjunctiva/sclera: Conjunctivae normal.     Pupils: Pupils are equal, round, and reactive to light.  Cardiovascular:     Rate and Rhythm: Normal rate and regular rhythm.     Heart sounds: No murmur heard. Pulmonary:     Effort: Pulmonary effort is normal. Tachypnea present. No respiratory distress.     Breath sounds: Normal breath sounds. No wheezing, rhonchi or rales.  Abdominal:     General: Abdomen is flat. Bowel sounds are normal.  Musculoskeletal:     Cervical back: Normal range of motion and neck supple.  Skin:    General: Skin is warm and dry.     Capillary Refill: Capillary refill takes less than 2 seconds.     Coloration: Skin is jaundiced.       Neurological:     General: No focal deficit present.     Mental  Status: He is alert and oriented to person, place, and time.  Psychiatric:        Mood and Affect: Mood normal.        Behavior: Behavior normal.        Thought Content: Thought content normal.        Judgment: Judgment normal.    Results for orders placed or performed in visit on 11/03/20  Potassium  Result Value Ref Range   Potassium 3.5 3.5 - 5.2 mmol/L  HgB A1c  Result Value Ref Range   Hgb A1c MFr Bld 5.7 (H) 4.8 - 5.6 %   Est. average glucose Bld gHb Est-mCnc 117 mg/dL  CBC with Differential/Platelet  Result Value Ref Range   WBC 10.7 3.4 - 10.8 x10E3/uL   RBC 4.60 4.14 - 5.80 x10E6/uL   Hemoglobin 14.8 13.0 - 17.7 g/dL   Hematocrit 43.7 37.5 - 51.0 %   MCV 95 79 - 97 fL   MCH 32.2 26.6 - 33.0 pg   MCHC 33.9 31.5 - 35.7 g/dL   RDW 13.9 11.6 - 15.4 %   Platelets 249 150 - 450 x10E3/uL   Neutrophils 59 Not Estab. %   Lymphs 24 Not Estab. %   Monocytes 13 Not Estab. %   Eos 2 Not Estab. %   Basos 1 Not Estab. %   Neutrophils Absolute 6.5 1.4 - 7.0 x10E3/uL   Lymphocytes Absolute 2.5 0.7 - 3.1 x10E3/uL   Monocytes Absolute 1.4 (H) 0.1 -  0.9 x10E3/uL   EOS (ABSOLUTE) 0.2 0.0 - 0.4 x10E3/uL   Basophils Absolute 0.1 0.0 - 0.2 x10E3/uL   Immature Granulocytes 1 Not Estab. %   Immature Grans (Abs) 0.1 0.0 - 0.1 x10E3/uL      Assessment & Plan:   Problem List Items Addressed This Visit       Digestive   Intestinal hernia - Primary    Patient has had 15lb weight loss in 3 months, Oxygen sats are in the low 80s, appears jaundice, having dizziness and nausea.  Does not appear well on exam.  Will send to the ER due oxygen sats and possible strangulated hernia.  Patient and son agree to be evaluated in the ER.        Follow up plan: Return if symptoms worsen or fail to improve.

## 2021-03-07 NOTE — Assessment & Plan Note (Signed)
Patient has had 15lb weight loss in 3 months, Oxygen sats are in the low 80s, appears jaundice, having dizziness and nausea.  Does not appear well on exam.  Will send to the ER due oxygen sats and possible strangulated hernia.  Patient and son agree to be evaluated in the ER.

## 2021-03-09 ENCOUNTER — Inpatient Hospital Stay: Payer: Medicare Other

## 2021-03-09 ENCOUNTER — Emergency Department: Payer: Medicare Other

## 2021-03-09 ENCOUNTER — Encounter: Payer: Self-pay | Admitting: Radiology

## 2021-03-09 ENCOUNTER — Inpatient Hospital Stay
Admission: EM | Admit: 2021-03-09 | Discharge: 2021-03-25 | DRG: 871 | Disposition: E | Payer: Medicare Other | Attending: Internal Medicine | Admitting: Internal Medicine

## 2021-03-09 DIAGNOSIS — N1831 Chronic kidney disease, stage 3a: Secondary | ICD-10-CM | POA: Diagnosis present

## 2021-03-09 DIAGNOSIS — J9690 Respiratory failure, unspecified, unspecified whether with hypoxia or hypercapnia: Secondary | ICD-10-CM | POA: Diagnosis not present

## 2021-03-09 DIAGNOSIS — U071 COVID-19: Secondary | ICD-10-CM | POA: Diagnosis not present

## 2021-03-09 DIAGNOSIS — E785 Hyperlipidemia, unspecified: Secondary | ICD-10-CM | POA: Diagnosis not present

## 2021-03-09 DIAGNOSIS — R609 Edema, unspecified: Secondary | ICD-10-CM

## 2021-03-09 DIAGNOSIS — M7989 Other specified soft tissue disorders: Secondary | ICD-10-CM | POA: Diagnosis not present

## 2021-03-09 DIAGNOSIS — K439 Ventral hernia without obstruction or gangrene: Secondary | ICD-10-CM | POA: Diagnosis present

## 2021-03-09 DIAGNOSIS — K449 Diaphragmatic hernia without obstruction or gangrene: Secondary | ICD-10-CM | POA: Diagnosis not present

## 2021-03-09 DIAGNOSIS — N170 Acute kidney failure with tubular necrosis: Secondary | ICD-10-CM | POA: Diagnosis not present

## 2021-03-09 DIAGNOSIS — R7989 Other specified abnormal findings of blood chemistry: Secondary | ICD-10-CM | POA: Diagnosis present

## 2021-03-09 DIAGNOSIS — Z8249 Family history of ischemic heart disease and other diseases of the circulatory system: Secondary | ICD-10-CM

## 2021-03-09 DIAGNOSIS — I10 Essential (primary) hypertension: Secondary | ICD-10-CM | POA: Diagnosis present

## 2021-03-09 DIAGNOSIS — Z8572 Personal history of non-Hodgkin lymphomas: Secondary | ICD-10-CM

## 2021-03-09 DIAGNOSIS — R0602 Shortness of breath: Secondary | ICD-10-CM | POA: Diagnosis not present

## 2021-03-09 DIAGNOSIS — A4189 Other specified sepsis: Secondary | ICD-10-CM | POA: Diagnosis not present

## 2021-03-09 DIAGNOSIS — J9601 Acute respiratory failure with hypoxia: Secondary | ICD-10-CM | POA: Diagnosis present

## 2021-03-09 DIAGNOSIS — N183 Chronic kidney disease, stage 3 unspecified: Secondary | ICD-10-CM | POA: Diagnosis present

## 2021-03-09 DIAGNOSIS — Z66 Do not resuscitate: Secondary | ICD-10-CM | POA: Diagnosis not present

## 2021-03-09 DIAGNOSIS — M87852 Other osteonecrosis, left femur: Secondary | ICD-10-CM | POA: Diagnosis not present

## 2021-03-09 DIAGNOSIS — E876 Hypokalemia: Secondary | ICD-10-CM | POA: Diagnosis not present

## 2021-03-09 DIAGNOSIS — K436 Other and unspecified ventral hernia with obstruction, without gangrene: Secondary | ICD-10-CM

## 2021-03-09 DIAGNOSIS — R109 Unspecified abdominal pain: Secondary | ICD-10-CM | POA: Diagnosis not present

## 2021-03-09 DIAGNOSIS — L03311 Cellulitis of abdominal wall: Secondary | ICD-10-CM | POA: Diagnosis not present

## 2021-03-09 DIAGNOSIS — I2510 Atherosclerotic heart disease of native coronary artery without angina pectoris: Secondary | ICD-10-CM | POA: Diagnosis present

## 2021-03-09 DIAGNOSIS — I130 Hypertensive heart and chronic kidney disease with heart failure and stage 1 through stage 4 chronic kidney disease, or unspecified chronic kidney disease: Secondary | ICD-10-CM | POA: Diagnosis present

## 2021-03-09 DIAGNOSIS — N281 Cyst of kidney, acquired: Secondary | ICD-10-CM | POA: Diagnosis not present

## 2021-03-09 DIAGNOSIS — K219 Gastro-esophageal reflux disease without esophagitis: Secondary | ICD-10-CM | POA: Diagnosis present

## 2021-03-09 DIAGNOSIS — Z87891 Personal history of nicotine dependence: Secondary | ICD-10-CM

## 2021-03-09 DIAGNOSIS — K828 Other specified diseases of gallbladder: Secondary | ICD-10-CM | POA: Diagnosis not present

## 2021-03-09 DIAGNOSIS — R404 Transient alteration of awareness: Secondary | ICD-10-CM | POA: Diagnosis not present

## 2021-03-09 DIAGNOSIS — A419 Sepsis, unspecified organism: Secondary | ICD-10-CM | POA: Diagnosis not present

## 2021-03-09 DIAGNOSIS — R7401 Elevation of levels of liver transaminase levels: Secondary | ICD-10-CM | POA: Diagnosis not present

## 2021-03-09 DIAGNOSIS — Z7982 Long term (current) use of aspirin: Secondary | ICD-10-CM

## 2021-03-09 DIAGNOSIS — E872 Acidosis, unspecified: Secondary | ICD-10-CM | POA: Diagnosis present

## 2021-03-09 DIAGNOSIS — J1282 Pneumonia due to coronavirus disease 2019: Secondary | ICD-10-CM | POA: Diagnosis present

## 2021-03-09 DIAGNOSIS — R918 Other nonspecific abnormal finding of lung field: Secondary | ICD-10-CM | POA: Diagnosis not present

## 2021-03-09 DIAGNOSIS — Z515 Encounter for palliative care: Secondary | ICD-10-CM | POA: Diagnosis not present

## 2021-03-09 DIAGNOSIS — R54 Age-related physical debility: Secondary | ICD-10-CM | POA: Diagnosis present

## 2021-03-09 DIAGNOSIS — R0902 Hypoxemia: Secondary | ICD-10-CM | POA: Diagnosis not present

## 2021-03-09 DIAGNOSIS — G9341 Metabolic encephalopathy: Secondary | ICD-10-CM | POA: Diagnosis present

## 2021-03-09 DIAGNOSIS — G894 Chronic pain syndrome: Secondary | ICD-10-CM | POA: Diagnosis present

## 2021-03-09 DIAGNOSIS — R000 Tachycardia, unspecified: Secondary | ICD-10-CM | POA: Diagnosis not present

## 2021-03-09 DIAGNOSIS — I5042 Chronic combined systolic (congestive) and diastolic (congestive) heart failure: Secondary | ICD-10-CM | POA: Diagnosis not present

## 2021-03-09 DIAGNOSIS — M069 Rheumatoid arthritis, unspecified: Secondary | ICD-10-CM | POA: Diagnosis not present

## 2021-03-09 DIAGNOSIS — J069 Acute upper respiratory infection, unspecified: Secondary | ICD-10-CM | POA: Diagnosis not present

## 2021-03-09 DIAGNOSIS — J984 Other disorders of lung: Secondary | ICD-10-CM | POA: Diagnosis not present

## 2021-03-09 DIAGNOSIS — Z888 Allergy status to other drugs, medicaments and biological substances status: Secondary | ICD-10-CM | POA: Diagnosis not present

## 2021-03-09 DIAGNOSIS — Z79899 Other long term (current) drug therapy: Secondary | ICD-10-CM

## 2021-03-09 DIAGNOSIS — R6520 Severe sepsis without septic shock: Secondary | ICD-10-CM | POA: Diagnosis not present

## 2021-03-09 DIAGNOSIS — R739 Hyperglycemia, unspecified: Secondary | ICD-10-CM | POA: Diagnosis not present

## 2021-03-09 DIAGNOSIS — R451 Restlessness and agitation: Secondary | ICD-10-CM | POA: Diagnosis not present

## 2021-03-09 DIAGNOSIS — R6889 Other general symptoms and signs: Secondary | ICD-10-CM | POA: Diagnosis not present

## 2021-03-09 DIAGNOSIS — Z743 Need for continuous supervision: Secondary | ICD-10-CM | POA: Diagnosis not present

## 2021-03-09 DIAGNOSIS — Z8611 Personal history of tuberculosis: Secondary | ICD-10-CM

## 2021-03-09 DIAGNOSIS — Z981 Arthrodesis status: Secondary | ICD-10-CM

## 2021-03-09 DIAGNOSIS — R0603 Acute respiratory distress: Secondary | ICD-10-CM | POA: Diagnosis not present

## 2021-03-09 LAB — BLOOD GAS, ARTERIAL
Acid-Base Excess: 0.3 mmol/L (ref 0.0–2.0)
Bicarbonate: 22.6 mmol/L (ref 20.0–28.0)
FIO2: 0.73
O2 Saturation: 98.1 %
Patient temperature: 37
pCO2 arterial: 29 mmHg — ABNORMAL LOW (ref 32.0–48.0)
pH, Arterial: 7.5 — ABNORMAL HIGH (ref 7.350–7.450)
pO2, Arterial: 97 mmHg (ref 83.0–108.0)

## 2021-03-09 LAB — CBC WITH DIFFERENTIAL/PLATELET
Abs Immature Granulocytes: 0.15 10*3/uL — ABNORMAL HIGH (ref 0.00–0.07)
Basophils Absolute: 0 10*3/uL (ref 0.0–0.1)
Basophils Relative: 0 %
Eosinophils Absolute: 0 10*3/uL (ref 0.0–0.5)
Eosinophils Relative: 0 %
HCT: 39.3 % (ref 39.0–52.0)
Hemoglobin: 13.2 g/dL (ref 13.0–17.0)
Immature Granulocytes: 1 %
Lymphocytes Relative: 3 %
Lymphs Abs: 0.6 10*3/uL — ABNORMAL LOW (ref 0.7–4.0)
MCH: 31.2 pg (ref 26.0–34.0)
MCHC: 33.6 g/dL (ref 30.0–36.0)
MCV: 92.9 fL (ref 80.0–100.0)
Monocytes Absolute: 0.9 10*3/uL (ref 0.1–1.0)
Monocytes Relative: 5 %
Neutro Abs: 15.4 10*3/uL — ABNORMAL HIGH (ref 1.7–7.7)
Neutrophils Relative %: 91 %
Platelets: 213 10*3/uL (ref 150–400)
RBC: 4.23 MIL/uL (ref 4.22–5.81)
RDW: 14.3 % (ref 11.5–15.5)
WBC: 17.1 10*3/uL — ABNORMAL HIGH (ref 4.0–10.5)
nRBC: 0 % (ref 0.0–0.2)

## 2021-03-09 LAB — URINALYSIS, COMPLETE (UACMP) WITH MICROSCOPIC
Glucose, UA: NEGATIVE mg/dL
Ketones, ur: 15 mg/dL — AB
Leukocytes,Ua: NEGATIVE
Nitrite: NEGATIVE
Protein, ur: 100 mg/dL — AB
Specific Gravity, Urine: 1.02 (ref 1.005–1.030)
pH: 5.5 (ref 5.0–8.0)

## 2021-03-09 LAB — C-REACTIVE PROTEIN: CRP: 24.2 mg/dL — ABNORMAL HIGH (ref <1.0)

## 2021-03-09 LAB — COMPREHENSIVE METABOLIC PANEL
ALT: 72 U/L — ABNORMAL HIGH (ref 0–44)
AST: 332 U/L — ABNORMAL HIGH (ref 15–41)
Albumin: 3 g/dL — ABNORMAL LOW (ref 3.5–5.0)
Alkaline Phosphatase: 82 U/L (ref 38–126)
Anion gap: 15 (ref 5–15)
BUN: 38 mg/dL — ABNORMAL HIGH (ref 8–23)
CO2: 24 mmol/L (ref 22–32)
Calcium: 8.5 mg/dL — ABNORMAL LOW (ref 8.9–10.3)
Chloride: 96 mmol/L — ABNORMAL LOW (ref 98–111)
Creatinine, Ser: 1.43 mg/dL — ABNORMAL HIGH (ref 0.61–1.24)
GFR, Estimated: 51 mL/min — ABNORMAL LOW (ref >60)
Glucose, Bld: 92 mg/dL (ref 70–99)
Potassium: 3.5 mmol/L (ref 3.5–5.1)
Sodium: 135 mmol/L (ref 135–145)
Total Bilirubin: 1.3 mg/dL — ABNORMAL HIGH (ref 0.3–1.2)
Total Protein: 6.6 g/dL (ref 6.5–8.1)

## 2021-03-09 LAB — PROTIME-INR
INR: 1 (ref 0.8–1.2)
Prothrombin Time: 13 seconds (ref 11.4–15.2)

## 2021-03-09 LAB — RESP PANEL BY RT-PCR (FLU A&B, COVID) ARPGX2
Influenza A by PCR: NEGATIVE
Influenza B by PCR: NEGATIVE
SARS Coronavirus 2 by RT PCR: POSITIVE — AB

## 2021-03-09 LAB — D-DIMER, QUANTITATIVE: D-Dimer, Quant: 2.5 ug/mL-FEU — ABNORMAL HIGH (ref 0.00–0.50)

## 2021-03-09 LAB — GLUCOSE, CAPILLARY: Glucose-Capillary: 86 mg/dL (ref 70–99)

## 2021-03-09 LAB — SEDIMENTATION RATE: Sed Rate: 61 mm/hr — ABNORMAL HIGH (ref 0–20)

## 2021-03-09 LAB — APTT: aPTT: 26 seconds (ref 24–36)

## 2021-03-09 LAB — PROCALCITONIN: Procalcitonin: 5.97 ng/mL

## 2021-03-09 LAB — LACTIC ACID, PLASMA
Lactic Acid, Venous: 3.1 mmol/L (ref 0.5–1.9)
Lactic Acid, Venous: 2.3 mmol/L (ref 0.5–1.9)

## 2021-03-09 LAB — FERRITIN: Ferritin: 396 ng/mL — ABNORMAL HIGH (ref 24–336)

## 2021-03-09 LAB — AMMONIA: Ammonia: 20 umol/L (ref 9–35)

## 2021-03-09 LAB — HIV ANTIBODY (ROUTINE TESTING W REFLEX): HIV Screen 4th Generation wRfx: NONREACTIVE

## 2021-03-09 LAB — BRAIN NATRIURETIC PEPTIDE: B Natriuretic Peptide: 170.2 pg/mL — ABNORMAL HIGH (ref 0.0–100.0)

## 2021-03-09 MED ORDER — SODIUM CHLORIDE 0.9 % IV SOLN
2.0000 g | Freq: Two times a day (BID) | INTRAVENOUS | Status: DC
  Administered 2021-03-10 – 2021-03-11 (×5): 2 g via INTRAVENOUS
  Filled 2021-03-09 (×7): qty 2

## 2021-03-09 MED ORDER — LACTATED RINGERS IV SOLN: INTRAVENOUS | Status: DC

## 2021-03-09 MED ORDER — IOHEXOL 300 MG/ML  SOLN
100.0000 mL | Freq: Once | INTRAMUSCULAR | Status: AC | PRN
  Administered 2021-03-09: 100 mL via INTRAVENOUS

## 2021-03-09 MED ORDER — SODIUM CHLORIDE 0.9 % IV SOLN
100.0000 mg | Freq: Every day | INTRAVENOUS | Status: DC
  Filled 2021-03-09: qty 20

## 2021-03-09 MED ORDER — ALBUTEROL SULFATE (2.5 MG/3ML) 0.083% IN NEBU: 2.5000 mg | INHALATION_SOLUTION | RESPIRATORY_TRACT | Status: DC | PRN

## 2021-03-09 MED ORDER — VANCOMYCIN HCL 750 MG/150ML IV SOLN
750.0000 mg | INTRAVENOUS | Status: DC
  Filled 2021-03-09: qty 150

## 2021-03-09 MED ORDER — FUROSEMIDE 10 MG/ML IJ SOLN
40.0000 mg | Freq: Every day | INTRAMUSCULAR | Status: DC
  Administered 2021-03-09 – 2021-03-10 (×2): 40 mg via INTRAVENOUS
  Filled 2021-03-09 (×2): qty 4

## 2021-03-09 MED ORDER — IPRATROPIUM-ALBUTEROL 0.5-2.5 (3) MG/3ML IN SOLN
3.0000 mL | Freq: Two times a day (BID) | RESPIRATORY_TRACT | Status: DC
  Filled 2021-03-09: qty 3

## 2021-03-09 MED ORDER — NOREPINEPHRINE 4 MG/250ML-% IV SOLN
2.0000 ug/min | INTRAVENOUS | Status: DC
  Filled 2021-03-09: qty 250

## 2021-03-09 MED ORDER — DIPHENHYDRAMINE HCL 50 MG/ML IJ SOLN: 12.5000 mg | Freq: Three times a day (TID) | INTRAMUSCULAR | Status: DC | PRN

## 2021-03-09 MED ORDER — LACTATED RINGERS IV BOLUS
1000.0000 mL | Freq: Once | INTRAVENOUS | Status: AC
  Administered 2021-03-09: 1000 mL via INTRAVENOUS

## 2021-03-09 MED ORDER — METRONIDAZOLE 500 MG/100ML IV SOLN
500.0000 mg | Freq: Once | INTRAVENOUS | Status: AC
  Administered 2021-03-09: 500 mg via INTRAVENOUS
  Filled 2021-03-09: qty 100

## 2021-03-09 MED ORDER — MORPHINE SULFATE (PF) 2 MG/ML IV SOLN
2.0000 mg | Freq: Once | INTRAVENOUS | Status: AC
  Administered 2021-03-09: 2 mg via INTRAVENOUS
  Filled 2021-03-09: qty 1

## 2021-03-09 MED ORDER — IPRATROPIUM-ALBUTEROL 0.5-2.5 (3) MG/3ML IN SOLN
3.0000 mL | Freq: Four times a day (QID) | RESPIRATORY_TRACT | Status: DC
  Administered 2021-03-10 – 2021-03-12 (×7): 3 mL via RESPIRATORY_TRACT
  Filled 2021-03-09 (×9): qty 3

## 2021-03-09 MED ORDER — IPRATROPIUM-ALBUTEROL 0.5-2.5 (3) MG/3ML IN SOLN: 3.0000 mL | RESPIRATORY_TRACT | Status: DC

## 2021-03-09 MED ORDER — VANCOMYCIN HCL 1250 MG/250ML IV SOLN
1250.0000 mg | Freq: Once | INTRAVENOUS | Status: AC
Start: 2021-03-09 — End: 2021-03-09
  Administered 2021-03-09: 1250 mg via INTRAVENOUS
  Filled 2021-03-09: qty 250

## 2021-03-09 MED ORDER — SODIUM CHLORIDE 0.9 % IV SOLN
200.0000 mg | Freq: Once | INTRAVENOUS | Status: DC
  Filled 2021-03-09: qty 40

## 2021-03-09 MED ORDER — HYDRALAZINE HCL 20 MG/ML IJ SOLN: 5.0000 mg | INTRAMUSCULAR | Status: DC | PRN

## 2021-03-09 MED ORDER — METHYLPREDNISOLONE SODIUM SUCC 40 MG IJ SOLR
40.0000 mg | Freq: Two times a day (BID) | INTRAMUSCULAR | Status: DC
  Administered 2021-03-09 – 2021-03-12 (×6): 40 mg via INTRAVENOUS
  Filled 2021-03-09 (×6): qty 1

## 2021-03-09 MED ORDER — HALOPERIDOL LACTATE 5 MG/ML IJ SOLN
2.0000 mg | Freq: Four times a day (QID) | INTRAMUSCULAR | Status: DC | PRN
  Administered 2021-03-10 – 2021-03-11 (×4): 2 mg via INTRAVENOUS
  Filled 2021-03-09 (×6): qty 1

## 2021-03-09 MED ORDER — DM-GUAIFENESIN ER 30-600 MG PO TB12: 1.0000 | ORAL_TABLET | Freq: Two times a day (BID) | ORAL | Status: DC | PRN

## 2021-03-09 MED ORDER — IPRATROPIUM-ALBUTEROL 0.5-2.5 (3) MG/3ML IN SOLN
RESPIRATORY_TRACT | Status: AC
  Administered 2021-03-09: 3 mL
  Filled 2021-03-09: qty 3

## 2021-03-09 MED ORDER — MORPHINE SULFATE (PF) 2 MG/ML IV SOLN: 0.5000 mg | INTRAVENOUS | Status: DC | PRN

## 2021-03-09 MED ORDER — METRONIDAZOLE 500 MG/100ML IV SOLN
500.0000 mg | Freq: Two times a day (BID) | INTRAVENOUS | Status: DC
  Administered 2021-03-09 – 2021-03-11 (×5): 500 mg via INTRAVENOUS
  Filled 2021-03-09 (×8): qty 100

## 2021-03-09 MED ORDER — SODIUM CHLORIDE 0.9 % IV SOLN: Freq: Once | INTRAVENOUS | Status: DC

## 2021-03-09 MED ORDER — SODIUM CHLORIDE 0.9 % IV BOLUS
250.0000 mL | Freq: Once | INTRAVENOUS | Status: AC
  Administered 2021-03-09: 250 mL via INTRAVENOUS

## 2021-03-09 MED ORDER — HALOPERIDOL LACTATE 5 MG/ML IJ SOLN
INTRAMUSCULAR | Status: AC
  Administered 2021-03-09: 2 mg via INTRAVENOUS
  Filled 2021-03-09: qty 1

## 2021-03-09 MED ORDER — DEXMEDETOMIDINE HCL IN NACL 400 MCG/100ML IV SOLN
0.4000 ug/kg/h | INTRAVENOUS | Status: DC
  Administered 2021-03-09: 0.4 ug/kg/h via INTRAVENOUS
  Administered 2021-03-09 – 2021-03-12 (×13): 1.2 ug/kg/h via INTRAVENOUS
  Filled 2021-03-09 (×13): qty 100

## 2021-03-09 MED ORDER — HYDROMORPHONE HCL 1 MG/ML IJ SOLN
0.5000 mg | INTRAMUSCULAR | Status: DC | PRN
  Administered 2021-03-10 – 2021-03-11 (×9): 0.5 mg via INTRAVENOUS
  Filled 2021-03-09 (×9): qty 1

## 2021-03-09 MED ORDER — SODIUM CHLORIDE 0.9 % IV SOLN
250.0000 mL | INTRAVENOUS | Status: DC
  Administered 2021-03-09 – 2021-03-12 (×2): 250 mL via INTRAVENOUS

## 2021-03-09 MED ORDER — ENOXAPARIN SODIUM 40 MG/0.4ML IJ SOSY
40.0000 mg | PREFILLED_SYRINGE | INTRAMUSCULAR | Status: DC
  Administered 2021-03-09 – 2021-03-11 (×3): 40 mg via SUBCUTANEOUS
  Filled 2021-03-09 (×3): qty 0.4

## 2021-03-09 MED ORDER — HALOPERIDOL LACTATE 5 MG/ML IJ SOLN: 2.0000 mg | Freq: Once | INTRAMUSCULAR | Status: AC

## 2021-03-09 MED ORDER — SODIUM CHLORIDE 0.9 % IV SOLN
2.0000 g | Freq: Once | INTRAVENOUS | Status: AC
  Administered 2021-03-09: 2 g via INTRAVENOUS
  Filled 2021-03-09: qty 2

## 2021-03-09 NOTE — ED Triage Notes (Signed)
Pt BIB EMS from home, being treated for abdominal wound/hernia put on Keflex. Family reports "SOB and not acting like himself today". AO only to self and place.   160/100 T= 97.6 axillary SPO2 88%, 96% on NRB.

## 2021-03-09 NOTE — Consult Note (Signed)
[facility] SURGICAL CONSULTATION NOTE    HISTORY OF PRESENT ILLNESS (HPI):  75 y.o. male presented to [facility] ED today for evaluation of shortness of breath and abdominal pain.  Patient was seen at [facility] ED on 12/14 for erythema of abdominal wall and abdominal pain.  At that time he was noted to be COVID-positive.  He was discharged home without admission on Keflex for a suspected abdominal wall cellulitis.  He now presents with worsening shortness of breath and difficulty breathing.  General surgery was consulted secondary to abdominal wall erythema with area of open skin.  History was unable to be obtained from the patient given altered mentation.  Per his granddaughter, he had a history of a bowel rupture requiring a large abdominal surgery.  He subsequently had an open wound that required packing.  Patient was not sure when his last bowel movement was, but confirms passing flatus.  He denied nausea and vomiting.  His significant complaint at examination was right leg pain and shortness of breath.  He has a history of rheumatoid arthritis on 7.5 mg prednisone daily, HTN, dyslipidemia, CKD, CAD, lymphoma, and CHF.  Surgery is consulted by Dr. Quentin Cornwall in this context for evaluation and management abdominal wall with erythema and area of open skin overlying ventral hernia.  PAST MEDICAL HISTORY (PMH):  Past Medical History:  Diagnosis Date   Arthritis    rheumatoid arthritis, takes prednisone for this   Barrett's esophagus    Cancer ([place]) 1999   non hodgkins lymphoma   Chronic back pain    Chronic pain syndrome    Claustrophobia    Coronary artery disease    GERD (gastroesophageal reflux disease)    barretts esophagus that requires dilation every couple years   Hemorrhoids    Hernia of abdominal cavity    History of hiatal hernia    Hyperlipidemia    Hypertension    Lumbago    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   Wears dentures    Weight loss 11/2017   30  lb weight loss over 3 years     PAST SURGICAL HISTORY ([place]):  Past Surgical History:  Procedure Laterality Date   [place]   2 fusions then 1 surgery to fix the other two (lower)..plates and screws   CATARACT EXTRACTION W/PHACO Left 12/31/2018   Procedure: CATARACT EXTRACTION PHACO AND INTRAOCULAR LENS PLACEMENT ([place]) LEFT VISION BLUE;  Surgeon: Marchia Meiers, MD;  Location: [facility] ORS;  Service: Ophthalmology;  Laterality: Left;  Lot #[id number] H [place]: 01:51.7 CDE: 18.59   CYSTECTOMY     from buttocks   ESOPHAGEAL DILATION     multiple times   ESOPHAGOGASTRODUODENOSCOPY (EGD) WITH PROPOFOL N/A 12/05/2014   Procedure: ESOPHAGOGASTRODUODENOSCOPY (EGD) WITH PROPOFOL;  Surgeon: Hulen Luster, MD;  Location: [facility] ENDOSCOPY;  Service: Gastroenterology;  Laterality: N/A;   EXCISION PARTIAL PHALANX Right 12/09/2017   Procedure: EXCISION PARTIAL PHALANX-GREAT TOE;  Surgeon: Sharlotte Alamo, DPM;  Location: [facility] ORS;  Service: Podiatry;  Laterality: Right;   FOOT SURGERY     intestine repair  1998   during biopsy his intestines were nicked requiring repair   MULTIPLE TOOTH EXTRACTIONS     NECK SURGERY  1996   fused 2 vertebrae in neck (plate plus screws)   RADIOLOGY WITH ANESTHESIA N/A 03/09/2019   Procedure: MRI WITH ANESTHESIA LUMBAR WITHOUT CONTRAST;  Surgeon: Radiologist, Medication, MD;  Location: [place];  Service: Radiology;  Laterality: N/A;   RADIOLOGY WITH ANESTHESIA N/A 09/23/2019   Procedure: MRI WITH ANESTHESIA; LUMBAR SPINE WITHOUT CONTRAST;  Surgeon: Radiologist, Medication, MD;  Location: [place];  Service: Radiology;  Laterality: N/A;   Reduction masseter muscle/bone extraoral       MEDICATIONS:  Prior to Admission medications   Medication Sig Start Date End Date Taking? Authorizing Provider  cephALEXin (KEFLEX) 500 MG capsule Take 500 mg by mouth in the morning, at noon, in the evening, and at bedtime. 03/08/21 03/15/21 Yes [provider]   predniSONE (DELTASONE) 5 MG tablet Take 1.5 tablets by mouth daily. 08/30/19  Yes [provider]  acetaminophen (TYLENOL) 500 MG tablet Take 1,000 mg by mouth every 6 (six) hours as needed for mild pain or moderate pain.     [provider]  albuterol (VENTOLIN HFA) 108 (90 Base) MCG/ACT inhaler Inhale 2 puffs into the lungs every 6 (six) hours as needed for wheezing or shortness of breath. 10/06/20   Cannady, Jolene T, NP  amLODipine (NORVASC) 5 MG tablet TAKE ONE (1) TABLET BY MOUTH EACH DAY Patient taking differently: Take 5 mg by mouth daily. TAKE ONE (1) TABLET BY MOUTH EACH DAY 10/06/20   Cannady, Henrine Screws T, NP  aspirin EC 81 MG tablet Take 1 tablet (81 mg total) by mouth daily. 07/22/16   Kathrine Haddock, NP  atorvastatin (LIPITOR) 10 MG tablet Take 1 tablet (10 mg total) by mouth daily. 10/06/20   Cannady, Henrine Screws T, NP  Cholecalciferol (VITAMIN D-3) 1000 units CAPS Take 1,000 Units by mouth daily.    [provider]  furosemide (LASIX) 40 MG tablet Take 1 tablet (40 mg total) by mouth as needed for edema (daily as needed only for edema). 04/13/20   Cannady, Henrine Screws T, NP  gabapentin (NEURONTIN) 100 MG capsule Take 100 mg by mouth 3 (three) times daily.    [provider]  hydrochlorothiazide (HYDRODIURIL) 12.5 MG tablet Take 1 tablet (12.5 mg total) by mouth daily. Stop taking 25 MG tablets. 10/07/20   Cannady, Henrine Screws T, NP  naproxen sodium (ALEVE) 220 MG tablet Take 220-440 mg by mouth See admin instructions. Take 440 mg by mouth in the morning at 12pm. and 220 mg in the evening 12 AM    [provider]  omeprazole (PRILOSEC) 20 MG capsule Take 2 capsules (40 mg total) by mouth daily. 03/27/20   Cannady, Henrine Screws T, NP  sulfaSALAzine (AZULFIDINE) 500 MG EC tablet Take by mouth. 02/21/21 03/07/21  [provider]  traMADol (ULTRAM) 50 MG tablet Take 1 tablet (50 mg total) by mouth daily as needed. Patient taking differently: Take 50 mg by mouth every 4  (four) hours. 10/15/17   Kathrine Haddock, NP  vitamin B-12 (CYANOCOBALAMIN) 1000 MCG tablet Take 1,000 mcg by mouth daily.    [provider]     ALLERGIES:  Allergies  Allergen Reactions   Ativan [Lorazepam] Other (See Comments)    Pt states medication made him hyper   Gold Au 198 [Gold] Other (See Comments)    Pt states platelet count was low. This was in 16.     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  Tobacco  Use   Smoking status: Former    Types: Cigarettes    Quit date: 1998    Years since quitting: 24.9   Smokeless tobacco: Never  Vaping Use   Vaping Use: Never used  Substance and Sexual Activity   Alcohol use: No    Comment: stopped d/t cluster headaches   Drug use: No   Sexual activity: Not Currently    Comment: pt did not want to answer  Other Topics Concern   Not on file  Social History Narrative   Not on file   Social Determinants of Health   Financial Resource Strain: Not on file  Food Insecurity: Not on file  Transportation Needs: Not on file  Physical Activity: Not on file  Stress: Not on file  Social Connections: Not on file  Intimate Partner Violence: Not on file     FAMILY HISTORY:  Family History  Problem Relation Age of Onset   Heart disease Mother        heart attack   Arthritis Mother    Arthritis Father       REVIEW OF SYSTEMS:  Review of Systems  Constitutional:  Negative for chills and fever.  Respiratory:  Positive for cough and shortness of breath.   Cardiovascular:  Negative for chest pain and palpitations.  Gastrointestinal:  Negative for abdominal pain, nausea and vomiting.  Genitourinary: Negative.   Musculoskeletal: Negative.   Skin:         Confirms abdominal wall erythema   VITAL SIGNS:  Temp:  [97.8 F (36.6 C)] 97.8 F (36.6 C) (12/16 1110) Pulse Rate:  [108-113] 110 (12/16 1230) Resp:  [23-37] 25 (12/16 1330) BP: (117-141)/(77-98) 141/77 (12/16 1330) SpO2:  [91 %-96 %] 91 % (12/16 1230) Weight:  [59 kg] 59 kg (12/16 1104)     Height: 5\' 8"  (172.7 cm) Weight: 59 kg BMI (Calculated): 19.77   INTAKE/OUTPUT:  No intake/output data recorded.  PHYSICAL EXAM:  Physical Exam Constitutional:      General: He is in acute distress.     Appearance: He is ill-appearing.  HENT:     Head: Normocephalic and atraumatic.  Eyes:     Extraocular Movements: Extraocular movements intact.     Pupils: Pupils are equal, round, and reactive to light.  Cardiovascular:     Rate and Rhythm: Regular rhythm. Tachycardia present.  Pulmonary:     Effort: Respiratory distress present.     Breath sounds: Rhonchi present.  Abdominal:     Comments: Abdomen is soft, nondistended, nontender, no percussion tenderness or peritoneal signs, no rebound, guarding, or rigidity.  Multiple fascial defects noted- all hernias easily reducible.  Overlying the inferior most ventral hernia was an area of erythema with a 1 cm opening with underlying fat, area nontender and easily reducible     Labs:  CBC Latest Ref Rng & Units 03/05/2021 11/03/2020 10/06/2020  WBC 4.0 - 10.5 K/uL 17.1(H) 10.7 10.9(H)  Hemoglobin 13.0 - 17.0 g/dL 13.2 14.8 14.7  Hematocrit 39.0 - 52.0 % 39.3 43.7 41.9  Platelets 150 - 400 K/uL 213 249 252   CMP Latest Ref Rng & Units 03/02/2021 11/03/2020 10/06/2020  Glucose 70 - 99 mg/dL 92 - 126(H)  BUN 8 - 23 mg/dL 38(H) - 17  Creatinine 0.61 - 1.24 mg/dL 1.43(H) - 1.12  Sodium 135 - 145 mmol/L 135 - 138  Potassium 3.5 - 5.1 mmol/L 3.5 3.5 3.4(L)  Chloride 98 - 111 mmol/L 96(L) - 95(L)  CO2 22 - 32  mmol/L 24 - 22  Calcium 8.9 - 10.3 mg/dL 8.5(L) - 9.1  Total Protein 6.5 - 8.1 g/dL 6.6 - 6.9  Total Bilirubin 0.3 - 1.2 mg/dL 1.3(H) - 0.5   Alkaline Phos 38 - 126 U/L 82 - 65  AST 15 - 41 U/L 332(H) - 16  ALT 0 - 44 U/L 72(H) - 11    Imaging studies:  CT ABDOMEN AND PELVIS WITH CONTRAST IMPRESSION: 1. No evidence of bowel obstruction. 2. Large fat and nonobstructed bowel containing ventral hernia with a 6.4 cm aperture. 3. Laxity of the ventral abdominal wall with peritoneal fat and loops of nonobstructed bowel extending to the cutaneous skin surface. 4. Mosaic attenuation of the lung bases with geographic regions of ground-glass opacities and more nodular consolidative areas likely reflects infectious or inflammatory process. 5. Large hiatal hernia. 6. Avascular necrosis of the left femoral head. 7. Similar appearance of the T9 and T11 vertebral compression deformities. 8.  Aortic Atherosclerosis (ICD10-I70.0).  Assessment/Plan:  75 y.o. male who was admitted with shortness of breath and difficulty breathing and known to be COVID-positive.  General surgery consulted for ventral hernia with overlying erythema and skin defect, complicated by pertinent comorbidities including rheumatoid arthritis, positive COVID-19 status.   -CT abdomen and pelvis with no signs of obstruction and multiple ventral hernias containing loops of small and large intestine.  Hernia sacs extending up to skin  -Given the patient's current comorbidities with severe COVID-19 infection, rheumatoid arthritis on prednisone 7.5 mg, discussion was had with patient's granddaughter about plan for conservative management for skin defect overlying hernia.  It is difficult to say what is underlying this defect- could be hernia sac, omentum, or the early forming of an enterocutaneous fistula.  However, given his respiratory status, if the patient were to undergo surgery at this time, he would have significant difficulty being weaned from the ventilator.  Granddaughter is in agreement with this plan  -Will plan for daily dressing changes with Xeroform or Vaseline  gauze overlying skin defect  -If this area begins to leak fluid, would recommend placement of ostomy appliance to prevent excoriation of surrounding skin.   -Continue broad-spectrum antibiotics at this time, per medical team  -Case was also discussed with Dr. Dahlia Byes, who agrees that conservative management is in the best interest of the patient at this time  -Remainder of care per primary and ICU teams  All of the above findings and recommendations were discussed with the patient's granddaughter, and all of her questions were answered to her expressed satisfaction.  Thank you for the opportunity to participate in this patient's care.   -- Graciella Freer, DO

## 2021-03-09 NOTE — Progress Notes (Signed)
PHARMACY -  BRIEF ANTIBIOTIC NOTE   Pharmacy has received consult(s) for cefepime and vancomycin from an ED provider.  The patient's profile has been reviewed for ht/wt/allergies/indication/available labs.    One time order(s) placed for: Cefepime 2 g IV Vancomycin 1250 mg IV  Further antibiotics/pharmacy consults should be ordered by admitting physician if indicated.                       Thank you, Forde Dandy Aliviya Schoeller 03/23/2021  11:23 AM

## 2021-03-09 NOTE — Consult Note (Signed)
PHARMACY CONSULT NOTE - FOLLOW UP  Pharmacy Consult for Electrolyte Monitoring and Replacement   Recent Labs: Potassium (mmol/L)  Date Value  03/02/2021 3.5   Magnesium (mg/dL)  Date Value  10/06/2020 1.7   Calcium (mg/dL)  Date Value  02/26/2021 8.5 (L)   Albumin (g/dL)  Date Value  03/02/2021 3.0 (L)  10/06/2020 4.4   Sodium (mmol/L)  Date Value  03/03/2021 135  10/06/2020 138     Assessment:  75yo M presented with acute hypoxemic respiratory failure due to COVID19 with concomitant abd wall cellulitis. Pharmacy has been consulted for monitoring and replacement of electrolytes.  Goal of Therapy:  Electrolytes WNL  Plan:  No additional electrolyte replacement warranted at this time Recheck electrolytes with AM labs  Travis Haynes ,PharmD,BCPS Clinical Pharmacist 02/28/2021 6:41 PM

## 2021-03-09 NOTE — H&P (Addendum)
History and Physical    Travis Haynes WCH:[id number] DOB: [birth year] DOA: 03/03/2021  Referring MD/NP/PA:   PCP: Venita Lick, NP   Patient coming from:  The patient is coming from home.    Chief Complaint: AMS, cough, abdominal wall pain  HPI: Travis Haynes is a 75 y.o. male with medical history significant of hypertension, hyperlipidemia, GERD, rheumatoid arthritis, CKD stage IIIa, CAD, non-Hodgkin lymphoma, CHF with EF of 40-45%, TV (s/p of treatment 2015), GERD, who presents with altered mental status, shortness breath, abdominal pain.  Per his son at bedside, patient had positive COVID test on 12/15, and was noted to become confused.  Normally patient is alert oriented x3.  Currently patient knows his own name, but not oriented to the place and time.  Patient moves all extremities.  No facial droop or slurred speech. Patient has dry cough, mild shortness breath, no fever or chills.  Patient has nausea, but no vomiting, diarrhea or abdominal pain.  Patient has chronic ventral hernia for many years, recently his ventral hernia has protruded and becomes erythematous, swelling and painful.  Patient is started on Keflex without mprovement.  Patient does not have symptoms of UTI.   ED Course: pt was found to have WBC 17.1, lactic acid 3.1, INR 1.0, PTT 26, worsening renal function, urinalysis not impressive (hazy appearance, negative leukocyte, rare bacteria, WBC 6-10), abnormal liver function (ALP 82, AST 332, ALT 72, total bilirubin 1.3), temperature normal, blood pressure 121/98, heart rate 113, RR 37, oxygen saturation 88% on room air, currently on high flow nasal cannula oxygen.  Patient is admitted to stepdown as inpatient.  Dr. Lanney Gins of PCCM is consulted.  CXR: Bilateral lung densities, right side greater than left. Findings are suggestive for bilateral pneumonia.   CT-abd/pelvis: 1. No evidence of bowel obstruction. 2. Large fat and nonobstructed bowel containing ventral  hernia with a 6.4 cm aperture. 3. Laxity of the ventral abdominal wall with peritoneal fat and loops of nonobstructed bowel extending to the cutaneous skin surface. 4. Mosaic attenuation of the lung bases with geographic regions of ground-glass opacities and more nodular consolidative areas likely reflects infectious or inflammatory process. 5. Large hiatal hernia. 6. Avascular necrosis of the left femoral head. 7. Similar appearance of the T9 and T11 vertebral compression deformities. 8.  Aortic Atherosclerosis (ICD10-I70.0).  Review of Systems: Could not reviewed accurately due to altered mental status.   Allergy:  Allergies  Allergen Reactions   Ativan [Lorazepam] Other (See Comments)    Pt states medication made him hyper   Gold Au 198 [Gold] Other (See Comments)    Pt states platelet count was low. This was in 5.    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   Wears dentures    Weight loss 11/2017   30 lb weight loss over 3 years    Past Surgical  History:  Procedure Laterality Date   [place]   2 fusions then 1 surgery to fix the other two (lower)..plates and screws   CATARACT EXTRACTION W/PHACO Left 12/31/2018   Procedure: CATARACT EXTRACTION PHACO AND INTRAOCULAR LENS PLACEMENT ([facility]) LEFT VISION BLUE;  Surgeon: Marchia Meiers, MD;  Location: [facility] ORS;  Service: Ophthalmology;  Laterality: Left;  Lot #[id number] H [place]: 01:51.7 CDE: 18.59    CYSTECTOMY     from buttocks   ESOPHAGEAL DILATION     multiple times   ESOPHAGOGASTRODUODENOSCOPY (EGD) WITH PROPOFOL N/A 12/05/2014   Procedure: ESOPHAGOGASTRODUODENOSCOPY (EGD) WITH PROPOFOL;  Surgeon: Hulen Luster, MD;  Location: [facility] ENDOSCOPY;  Service: Gastroenterology;  Laterality: N/A;   EXCISION PARTIAL PHALANX Right 12/09/2017   Procedure: EXCISION PARTIAL PHALANX-GREAT TOE;  Surgeon: Sharlotte Alamo, DPM;  Location: [facility] ORS;  Service: Podiatry;  Laterality: Right;   FOOT SURGERY     intestine repair  1998   during biopsy his intestines were nicked requiring repair   MULTIPLE TOOTH EXTRACTIONS     NECK SURGERY  1996   fused 2 vertebrae in neck (plate plus screws)   RADIOLOGY WITH ANESTHESIA N/A 03/09/2019   Procedure: MRI WITH ANESTHESIA LUMBAR WITHOUT CONTRAST;  Surgeon: Radiologist, Medication, MD;  Location: [facility];  Service: Radiology;  Laterality: N/A;   RADIOLOGY WITH ANESTHESIA N/A 09/23/2019   Procedure: MRI WITH ANESTHESIA; LUMBAR SPINE WITHOUT CONTRAST;  Surgeon: Radiologist, Medication, MD;  Location: [facility];  Service: Radiology;  Laterality: N/A;   Reduction masseter muscle/bone extraoral      Social History:  reports that he quit smoking about 24 years ago. His smoking use included cigarettes. He has never used smokeless tobacco. He reports that he does not drink alcohol and does not use drugs.  Family History:  Family History  Problem Relation Age of Onset   Heart disease Mother        heart attack   Arthritis Mother    Arthritis Father      Prior to Admission medications   Medication Sig Start Date End Date Taking? Authorizing Provider  acetaminophen (TYLENOL) 500 MG tablet Take 1,000 mg by mouth every 6 (six) hours as needed for mild pain or moderate pain.     [provider]  albuterol (VENTOLIN HFA) 108 (90 Base) MCG/ACT inhaler Inhale 2 puffs into the lungs every 6 (six) hours as needed for wheezing or shortness of breath. 10/06/20   Cannady, Henrine Screws T,  NP  amLODipine (NORVASC) 5 MG tablet TAKE ONE (1) TABLET BY MOUTH EACH DAY 10/06/20   Marnee Guarneri T, NP  aspirin EC 81 MG tablet Take 1 tablet (81 mg total) by mouth daily. 07/22/16   Kathrine Haddock, NP  atorvastatin (LIPITOR) 10 MG tablet Take 1 tablet (10 mg total) by mouth daily. 10/06/20   Cannady, Henrine Screws T, NP  Cholecalciferol (VITAMIN D-3) 1000 units CAPS Take 1,000 Units by mouth daily.    [provider]  furosemide (LASIX) 40 MG tablet Take 1 tablet (40 mg total) by mouth as needed for edema (daily as needed only for edema). 04/13/20   Cannady, Henrine Screws T, NP  gabapentin (NEURONTIN) 100 MG capsule Take 100 mg by mouth 3 (three) times daily.    [provider]  hydrochlorothiazide (HYDRODIURIL) 12.5 MG tablet Take 1 tablet (12.5 mg total) by mouth daily. Stop taking 25 MG tablets. 10/07/20   Cannady, Henrine Screws T, NP  naproxen sodium (ALEVE) 220 MG tablet Take  220-440 mg by mouth See admin instructions. Take 440 mg by mouth in the morning at 12pm. and 220 mg in the evening 12 AM    [provider]  omeprazole (PRILOSEC) 20 MG capsule Take 2 capsules (40 mg total) by mouth daily. 03/27/20   Cannady, Henrine Screws T, NP  predniSONE (DELTASONE) 5 MG tablet Take 1.5 tablets by mouth daily. 08/30/19   [provider]  sulfaSALAzine (AZULFIDINE) 500 MG EC tablet Take by mouth. 02/21/21 03/07/21  [provider]  traMADol (ULTRAM) 50 MG tablet Take 1 tablet (50 mg total) by mouth daily as needed. Patient taking differently: Take 50 mg by mouth every 4 (four) hours. 10/15/17   Kathrine Haddock, NP  vitamin B-12 (CYANOCOBALAMIN) 1000 MCG tablet Take 1,000 mcg by mouth daily.    [provider]    Physical Exam: Vitals:   03/07/2021 1800 03/08/2021 1830 03/11/2021 1836 03/07/2021 1900  BP: (!) 114/101 (!) 160/141  (!) 89/64  Pulse: (!) 108 97 94 93  Resp: (!) 32 (!) 22 (!) 23 (!) 30  Temp:      TempSrc:      SpO2: 99% 97% 100% 100%  Weight:      Height:        General: Not in acute distress HEENT:       Eyes: PERRL, EOMI, no scleral icterus.       ENT: No discharge from the ears and nose       Neck: No JVD, no bruit, no mass felt. Heme: No neck lymph node enlargement. Cardiac: S1/S2, RRR, No murmurs, No gallops or rubs. Respiratory: No rales, wheezing, rhonchi or rubs. GI: Soft, nondistended, has central abdominal tenderness, BS present. Has ventral hernia with swelling, erythema and tenderness      GU: No hematuria Ext: No pitting leg edema bilaterally. 1+DP/PT pulse bilaterally. Musculoskeletal: No joint deformities, No joint redness or warmth, no limitation of ROM in spin. Skin: No rashes.  Neuro: Confused, knows his own name, not orientated to time and place, cranial nerves II-XII grossly intact, moves all extremities  Psych: Patient is not psychotic, no suicidal or hemocidal ideation.  Labs on Admission: I have personally reviewed following labs and imaging studies  CBC: Recent Labs  Lab 02/24/2021 1126  WBC 17.1*  NEUTROABS 15.4*  HGB 13.2  HCT 39.3  MCV 92.9  PLT 382   Basic Metabolic Panel: Recent Labs  Lab 03/11/2021 1126  NA 135  K 3.5  CL 96*  CO2 24  GLUCOSE 92  BUN 38*  CREATININE 1.43*  CALCIUM 8.5*   GFR: Estimated Creatinine Clearance: 37.4 mL/min (A) (by C-G formula based on SCr of 1.43 mg/dL (H)). Liver Function Tests: Recent Labs  Lab 03/08/2021 1126  AST 332*  ALT 72*  ALKPHOS 82  BILITOT 1.3*  PROT 6.6  ALBUMIN 3.0*   No results for input(s): LIPASE, AMYLASE in the last 168 hours. Recent Labs  Lab 03/22/2021 1824  AMMONIA 20   Coagulation Profile: Recent Labs  Lab 03/06/2021 1126  INR 1.0   Cardiac Enzymes: No results for input(s): CKTOTAL, CKMB, CKMBINDEX, TROPONINI in the last 168 hours. BNP (last 3 results) No results for input(s): PROBNP in the last 8760 hours. HbA1C: No results for input(s): HGBA1C in the last 72 hours. CBG: Recent Labs  Lab 03/01/2021 1604  GLUCAP 86    Lipid Profile: No results for input(s): CHOL, HDL, LDLCALC, TRIG, CHOLHDL, LDLDIRECT in the last 72 hours. Thyroid Function Tests: No results  for input(s): TSH, T4TOTAL, FREET4, T3FREE, THYROIDAB in the last 72 hours. Anemia Panel: No results for input(s): VITAMINB12, FOLATE, FERRITIN, TIBC, IRON, RETICCTPCT in the last 72 hours. Urine analysis:    Component Value Date/Time   COLORURINE AMBER (A) 03/17/2021 1126   APPEARANCEUR HAZY (A) 03/12/2021 1126   LABSPEC 1.020 03/22/2021 1126   PHURINE 5.5 03/20/2021 1126   GLUCOSEU NEGATIVE 03/03/2021 1126   HGBUR LARGE (A) 03/18/2021 1126   BILIRUBINUR SMALL (A) 03/10/2021 1126   KETONESUR 15 (A) 03/10/2021 1126   PROTEINUR 100 (A) 03/23/2021 1126   NITRITE NEGATIVE 03/06/2021 1126   LEUKOCYTESUR NEGATIVE 03/02/2021 1126   Sepsis Labs: @LABRCNTIP (procalcitonin:4,lacticidven:4) ) Recent Results (from the past 240 hour(s))  Resp Panel by RT-PCR (Flu A&B, Covid) Nasopharyngeal Swab     Status: Abnormal   Collection Time: 03/02/2021  4:07 PM   Specimen: Nasopharyngeal Swab; Nasopharyngeal(NP) swabs in vial transport medium  Result Value Ref Range Status   SARS Coronavirus 2 by RT PCR POSITIVE (A) NEGATIVE Final    Comment: (NOTE) SARS-CoV-2 target nucleic acids are DETECTED.  The SARS-CoV-2 RNA is generally detectable in upper respiratory specimens during the acute phase of infection. Positive results are indicative of the presence of the identified virus, but do not rule out bacterial infection or co-infection with other pathogens not detected by the test. Clinical correlation with patient history and other diagnostic information is necessary to determine patient infection status. The expected result is Negative.  Fact Sheet for Patients: EntrepreneurPulse.com.au  Fact Sheet for Healthcare Providers: IncredibleEmployment.be  This test is not yet approved or cleared by the [place] FDA and   has been authorized for detection and/or diagnosis of SARS-CoV-2 by FDA under an Emergency Use Authorization (EUA).  This EUA will remain in effect (meaning this test can be used) for the duration of  the COVID-19 declaration under Section 564(b)(1) of the A ct, 21 U.S.C. section 360bbb-3(b)(1), unless the authorization is terminated or revoked sooner.     Influenza A by PCR NEGATIVE NEGATIVE Final   Influenza B by PCR NEGATIVE NEGATIVE Final    Comment: (NOTE) The Xpert Xpress SARS-CoV-2/FLU/RSV plus assay is intended as an aid in the diagnosis of influenza from Nasopharyngeal swab specimens and should not be used as a sole basis for treatment. Nasal washings and aspirates are unacceptable for Xpert Xpress SARS-CoV-2/FLU/RSV testing.  Fact Sheet for Patients: EntrepreneurPulse.com.au  Fact Sheet for Healthcare Providers: IncredibleEmployment.be  This test is not yet approved or cleared by the [place] FDA and has been authorized for detection and/or diagnosis of SARS-CoV-2 by FDA under an Emergency Use Authorization (EUA). This EUA will remain in effect (meaning this test can be used) for the duration of the COVID-19 declaration under Section 564(b)(1) of the Act, 21 U.S.C. section 360bbb-3(b)(1), unless the authorization is terminated or revoked.  Performed at [facility], [address] [place], [place] [address]      Radiological Exams on Admission: CT ABDOMEN PELVIS W CONTRAST  Result Date: 03/19/2021 CLINICAL DATA:  Abdominal distension, being treated for open wound from hernia surgery concern for bowel obstruction. EXAM: CT ABDOMEN AND PELVIS WITH CONTRAST TECHNIQUE: Multidetector CT imaging of the abdomen and pelvis was performed using the standard protocol following bolus administration of intravenous contrast. CONTRAST:  186m OMNIPAQUE IOHEXOL 300 MG/ML  SOLN COMPARISON:  None. FINDINGS: Lower chest: Mosaic  attenuation of the lung bases with geographic regions of ground-glass opacities and more nodular consolidative areas. Normal size heart. Three-vessel  coronary artery disease. Hepatobiliary: No suspicious hepatic lesion. Gallbladder is distended without wall thickening or pericholecystic fluid. No biliary ductal dilation. Pancreas: No pancreatic ductal dilation or evidence of acute inflammation. Diffuse pancreatic atrophy. Spleen: Within normal limits. Adrenals/Urinary Tract: Bilateral adrenal glands are unremarkable. No hydronephrosis. Left renal cysts measuring up to 3.2 cm. Urinary bladder is unremarkable for degree of distension. Stomach/Bowel: Large hiatal hernia. No pathologic dilation of large or small bowel. No evidence of acute bowel inflammation. Large fat and nonobstructed bowel containing ventral hernia with a 6.4 cm aperture width. Laxity of the ventral abdominal wall containing fat and bowel which appears to extend to the left paramedian cutaneous surface. Vascular/Lymphatic: Aortic and branch vessel atherosclerosis without abdominal aortic aneurysm. No pathologically enlarged abdominal or pelvic lymph nodes. Reproductive: Prostate is unremarkable. Other: No significant abdominopelvic free fluid. Musculoskeletal: Degenerative and postoperative changes of the spine. Similar appearance of the T9 and T11 vertebral compression deformities. Avascular necrosis of the left femoral head. IMPRESSION: 1. No evidence of bowel obstruction. 2. Large fat and nonobstructed bowel containing ventral hernia with a 6.4 cm aperture. 3. Laxity of the ventral abdominal wall with peritoneal fat and loops of nonobstructed bowel extending to the cutaneous skin surface. 4. Mosaic attenuation of the lung bases with geographic regions of ground-glass opacities and more nodular consolidative areas likely reflects infectious or inflammatory process. 5. Large hiatal hernia. 6. Avascular necrosis of the left femoral head. 7. Similar  appearance of the T9 and T11 vertebral compression deformities. 8.  Aortic Atherosclerosis (ICD10-I70.0). Electronically Signed   By: Dahlia Bailiff M.D.   On: 02/23/2021 13:46   DG Chest Port 1 View  Result Date: 03/19/2021 CLINICAL DATA:  Questionable sepsis.  Evaluate for abnormality. EXAM: PORTABLE CHEST 1 VIEW COMPARISON:  09/06/2020 FINDINGS: Patchy densities throughout the mid and lower right chest. Patchy densities in the left lower chest. These lung densities are new. Surgical hardware in the lower cervical spine. Heart size is grossly stable. IMPRESSION: Bilateral lung densities, right side greater than left. Findings are suggestive for bilateral pneumonia. Electronically Signed   By: Markus Daft M.D.   On: 03/07/2021 11:42     EKG: I have personally reviewed.  Sinus rhythm, QTC 529, poor R wave progression, poor quality of EKG strips, LAD   Assessment/Plan Principal Problem:   Acute respiratory disease due to COVID-19 virus Active Problems:   Essential hypertension   Hyperlipidemia   Rheumatoid arthritis (HCC)   History of non-Hodgkin's lymphoma   CAD (coronary artery disease)   Chronic combined systolic and diastolic CHF (congestive heart failure) (HCC)   Acute respiratory failure with hypoxia (HCC)   Severe sepsis (HCC)   Ventral hernia   Abdominal wall cellulitis   Abnormal LFTs   Acute metabolic encephalopathy   CKD (chronic kidney disease), stage IIIa   Acute respiratory failure with hypoxia due to acute respiratory disease due to COVID-19 virus: consulted Dr. Lanney Gins of PCCM  -will admit to SDU as inpt -Remdesivir per pharm (initially his son refused remdesivir, but then agreed to start remdesivir for patient) -Solumedrol 40 mg bid -Bronchodilators -PRN Mucinex for cough -f/u Blood culture -Gentle IV fluid -f/u inflammatory markers -Will ask the patient to maintain an awake prone position for 16+ hours a day, if possible, with a minimum of 2-3 hours at a  time -Will attempt to maintain euvolemia to a net negative fluid status -Palliative consult  Abdominal wall cellulitis related to ventral hernia: General surgeon, Dr. Derryl Harbor is consulted -  Empiric antimicrobial treatment with vancomycin Flagyl, cefepime - PRN Zofran for nausea, morphine for pain - Blood cultures x 2  - ESR and CRP  Severe sepsis due to abdominal wall cellulitis and COVID-19 infection: Patient meets criteria for severe sepsis with WBC 17.1, tachycardia with heart rate of 113, RR 37.  Lactic acid is elevated at 3.1.  Due to history of CHF with EF of 40-45% and COVID-19 pneumonia, will not give aggressive IV fluid resuscitation -IV fluid: Patient received 1 L LR, will discontinue IV fluid -will get Procalcitonin and trend lactic acid levels per sepsis protocol.  Essential hypertension: -Hold blood pressure medications since patient is at high risk of developing hypotension due to sepsis -IV hydralazine as needed  Hyperlipidemia -Hold statin, Lipitor due to abnormal liver function  Rheumatoid arthritis ([place]) -Hold home sulfasalazine  History of non-Hodgkin's lymphoma --Follow-up oncology  CAD (coronary artery disease) -Hold Lipitor due to abnormal liver function and AMS, also hold ASA until mental status improves   Abnormal LFTs: Likely due to sepsis and COVID-19 infection -Follow-up with CMP  Acute metabolic encephalopathy: likely multifactorial etiology, including hypoxia, sepsis, pain -Hold all oral medications until mental status improves -Frequent neuro check -CT head  CKD (chronic kidney disease), stage IIIa: Slightly worsening.  Baseline creatinine 1.1 to 10/06/2020.  His creatinine is 1.43, BUN 38 -Hold diuretics -IV fluid as above, 1 L LR was given  Chronic combined diastolic and systolic CHF: 2D echo on 0/05/7094 showed EF of 40-45% with grade 1 diastolic dysfunction.  Patient does not have leg edema or JVD.  Does not seem to have CHF  exacerbation -Hold diuretics due to sepsis -Check BMP    DVT ppx:  SQ Lovenox Code Status: partial code per  his son (OK for intubation, no CPR) Family Communication: Yes, patient's son    at bed side.  Disposition Plan:  to be determined Consults called:  Dr. Lanney Gins Admission status and Level of care: Stepdown:     SDU/inpation         Status is: Inpatient  Remains inpatient appropriate because: Patient has multiple comorbidities, now presents with acute respiratory failure with hypoxia due to COVID-19 infection, also has sepsis and abdominal wall cellulitis related to ventral hernia.  Patient also has abnormal liver function and altered mental status.  His presentation is highly complicated.  Patient is at high risk of deteriorating.  Need to be treated in hospital for at least 2 days            Date of Service 03/12/2021    [facility]   If 7PM-7AM, please contact night-coverage [email or web address] 03/08/2021, 7:38 PM

## 2021-03-09 NOTE — Progress Notes (Signed)
CODE SEPSIS - PHARMACY COMMUNICATION  **Broad Spectrum Antibiotics should be administered within 1 hour of Sepsis diagnosis**  Time Code Sepsis Called/Page Received: 1122  Antibiotics Ordered: vancomycin, cefepime, metronidazole  Time of 1st antibiotic administration: [place] ,PharmD Clinical Pharmacist  03/01/2021  11:23 AM

## 2021-03-09 NOTE — Progress Notes (Signed)
Remdesivir - Pharmacy Brief Note   O:  ALT: 72 CXR: Bilateral lung densities, right side greater than left. Findings are suggestive for bilateral pneumonia. SpO2: 95% on 15 L HFNC   A/P:  Remdesivir 200 mg IVPB once followed by 100 mg IVPB daily x 4 days.   Sherilyn Banker, PharmD Clinical Pharmacist  03/12/2021 3:31 PM

## 2021-03-09 NOTE — ED Provider Notes (Signed)
[facility] Emergency Department Provider Note    None    (approximate)  I have reviewed the triage vital signs and the nursing notes.   HISTORY  Chief Complaint Code Sepsis  Level V Caveat:  poor historian  HPI Travis Haynes is a 75 y.o. male extensive past medical history on immunosuppression for history of rheumatoid arthritis with recent diagnosis of COVID-19 at [facility] and recent placement on Keflex for cellulitis overlying ventral hernia presents to the ER for worsening cough shortness of breath confusion altered mental status and drainage coming from hernia area.  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   Wears dentures    Weight loss 11/2017   30 lb weight loss over 3 years   Family History  Problem Relation Age of Onset   Heart disease Mother        heart attack   Arthritis Mother    Arthritis Father    Past Surgical History:  Procedure Laterality Date   [place]   2 fusions then 1 surgery to fix the other two (lower)..plates and screws   CATARACT EXTRACTION W/PHACO Left 12/31/2018   Procedure: CATARACT EXTRACTION PHACO AND INTRAOCULAR LENS PLACEMENT ([place]) LEFT VISION BLUE;  Surgeon: Marchia Meiers, MD;  Location: [facility] ORS;  Service: Ophthalmology;  Laterality: Left;  Lot #[id number] H [place]: 01:51.7 CDE: 18.59   CYSTECTOMY     from buttocks    ESOPHAGEAL DILATION     multiple times   ESOPHAGOGASTRODUODENOSCOPY (EGD) WITH PROPOFOL N/A 12/05/2014   Procedure: ESOPHAGOGASTRODUODENOSCOPY (EGD) WITH PROPOFOL;  Surgeon: Hulen Luster, MD;  Location: [facility] ENDOSCOPY;  Service: Gastroenterology;  Laterality: N/A;   EXCISION PARTIAL PHALANX Right 12/09/2017   Procedure: EXCISION PARTIAL PHALANX-GREAT TOE;  Surgeon: Sharlotte Alamo, DPM;  Location: [facility] ORS;  Service: Podiatry;  Laterality: Right;   FOOT SURGERY     intestine repair  1998   during biopsy his intestines were nicked requiring repair   MULTIPLE TOOTH EXTRACTIONS     NECK SURGERY  1996   fused 2 vertebrae in neck (plate plus screws)   RADIOLOGY WITH ANESTHESIA N/A 03/09/2019   Procedure: MRI WITH ANESTHESIA LUMBAR WITHOUT CONTRAST;  Surgeon: Radiologist, Medication, MD;  Location: [place];  Service: Radiology;  Laterality: N/A;   RADIOLOGY WITH ANESTHESIA N/A 09/23/2019   Procedure: MRI WITH ANESTHESIA; LUMBAR SPINE WITHOUT CONTRAST;  Surgeon: Radiologist, Medication, MD;  Location: [place];  Service: Radiology;  Laterality: N/A;   Reduction masseter muscle/bone extraoral     Patient Active Problem List   Diagnosis Date Noted   Intestinal hernia 03/07/2021   CAD (coronary artery disease)    Chronic combined systolic and diastolic CHF (congestive heart failure) ([place])    History of compression fracture of spine 04/13/2020  Age related osteoporosis 07/07/2019   Chronic venous insufficiency 04/28/2019   Lymphedema 04/28/2019   Vitamin B12 deficiency 08/11/2018   History of non-Hodgkin's lymphoma 11/16/2017   Allergy to alpha-gal 10/08/2017   Nocturnal leg cramps 09/30/2016   Advanced care planning/counseling discussion 07/22/2016   Allergic rhinitis 01/23/2015   Arteriosclerosis of coronary artery 01/16/2015   Essential hypertension 01/16/2015   Hyperlipidemia 01/16/2015   Chronic pain 01/16/2015   GERD (gastroesophageal reflux disease) 01/16/2015   Eczema intertrigo 01/19/2013    Rheumatoid arthritis ([place]) 06/30/2012      Prior to Admission medications   Medication Sig Start Date End Date Taking? Authorizing Provider  acetaminophen (TYLENOL) 500 MG tablet Take 1,000 mg by mouth every 6 (six) hours as needed for mild pain or moderate pain.     [provider]  albuterol (VENTOLIN HFA) 108 (90 Base) MCG/ACT inhaler Inhale 2 puffs into the lungs every 6 (six) hours as needed for wheezing or shortness of breath. 10/06/20   Cannady, Henrine Screws T, NP  amLODipine (NORVASC) 5 MG tablet TAKE ONE (1) TABLET BY MOUTH EACH DAY 10/06/20   Marnee Guarneri T, NP  aspirin EC 81 MG tablet Take 1 tablet (81 mg total) by mouth daily. 07/22/16   Kathrine Haddock, NP  atorvastatin (LIPITOR) 10 MG tablet Take 1 tablet (10 mg total) by mouth daily. 10/06/20   Cannady, Henrine Screws T, NP  Cholecalciferol (VITAMIN D-3) 1000 units CAPS Take 1,000 Units by mouth daily.    [provider]  furosemide (LASIX) 40 MG tablet Take 1 tablet (40 mg total) by mouth as needed for edema (daily as needed only for edema). 04/13/20   Cannady, Henrine Screws T, NP  gabapentin (NEURONTIN) 100 MG capsule Take 100 mg by mouth 3 (three) times daily.    [provider]  hydrochlorothiazide (HYDRODIURIL) 12.5 MG tablet Take 1 tablet (12.5 mg total) by mouth daily. Stop taking 25 MG tablets. 10/07/20   Cannady, Henrine Screws T, NP  naproxen sodium (ALEVE) 220 MG tablet Take 220-440 mg by mouth See admin instructions. Take 440 mg by mouth in the morning at 12pm. and 220 mg in the evening 12 AM    [provider]  omeprazole (PRILOSEC) 20 MG capsule Take 2 capsules (40 mg total) by mouth daily. 03/27/20   Cannady, Henrine Screws T, NP  predniSONE (DELTASONE) 5 MG tablet Take 1.5 tablets by mouth daily. 08/30/19   [provider]  sulfaSALAzine (AZULFIDINE) 500 MG EC tablet Take by mouth. 02/21/21 03/07/21  [provider]  traMADol (ULTRAM) 50 MG tablet Take 1 tablet (50 mg total) by mouth daily as  needed. Patient taking differently: Take 50 mg by mouth every 4 (four) hours. 10/15/17   Kathrine Haddock, NP  vitamin B-12 (CYANOCOBALAMIN) 1000 MCG tablet Take 1,000 mcg by mouth daily.    [provider]    Allergies Ativan [lorazepam] and Gold au 198 [gold]    Social History Social History   Tobacco Use   Smoking status: Former    Types: Cigarettes    Quit date: 1998    Years since quitting: 24.9   Smokeless tobacco: Never  Vaping Use   Vaping Use: Never used  Substance Use Topics   Alcohol use: No    Comment: stopped d/t cluster headaches   Drug use: No    Review of Systems Patient denies headaches, rhinorrhea, blurry vision, numbness, shortness of breath, chest pain, edema, cough, abdominal pain, nausea, vomiting, diarrhea, dysuria, fevers, rashes or hallucinations unless otherwise  stated above in HPI. ____________________________________________   PHYSICAL EXAM:  VITAL SIGNS: Vitals:   02/27/2021 1200 03/18/2021 1230  BP: 122/85 (!) 121/98  Pulse: (!) 108 (!) 110  Resp: (!) 37 (!) 23  Temp:    SpO2: 93% 91%    Constitutional: Alert, chronically ill appearing Eyes: Conjunctivae are normal.  Head: Atraumatic. Nose: No congestion/rhinnorhea. Mouth/Throat: Mucous membranes are moist.   Neck: No stridor. Painless ROM.  Cardiovascular: tachycardic  regular rhythm. Grossly normal heart sounds.  Cap refill 3 sec Respiratory: tachypnea with diffuse rhonchi throughout Gastrointestinal: golf ball sized area of erythema with ulceration and skin breakdown and what appears to be omental or peritoneal tissue.    Genitourinary:  Musculoskeletal: No lower extremity tenderness nor edema.  No joint effusions. Neurologic:  Normal speech and language. No gross focal neurologic deficits are appreciated. No facial droop Skin:  as above Psychiatric: Mood and affect are normal. Speech and behavior are normal.  ____________________________________________   LABS (all labs  ordered are listed, but only abnormal results are displayed)  Results for orders placed or performed during the hospital encounter of 03/20/2021 (from the past 24 hour(s))  Lactic acid, plasma     Status: Abnormal   Collection Time: 03/18/2021 11:26 AM  Result Value Ref Range   Lactic Acid, Venous 3.1 (HH) 0.5 - 1.9 mmol/L  Comprehensive metabolic panel     Status: Abnormal   Collection Time: 02/24/2021 11:26 AM  Result Value Ref Range   Sodium 135 135 - 145 mmol/L   Potassium 3.5 3.5 - 5.1 mmol/L   Chloride 96 (L) 98 - 111 mmol/L   CO2 24 22 - 32 mmol/L   Glucose, Bld 92 70 - 99 mg/dL   BUN 38 (H) 8 - 23 mg/dL   Creatinine, Ser 1.43 (H) 0.61 - 1.24 mg/dL   Calcium 8.5 (L) 8.9 - 10.3 mg/dL   Total Protein 6.6 6.5 - 8.1 g/dL   Albumin 3.0 (L) 3.5 - 5.0 g/dL   AST 332 (H) 15 - 41 U/L   ALT 72 (H) 0 - 44 U/L   Alkaline Phosphatase 82 38 - 126 U/L   Total Bilirubin 1.3 (H) 0.3 - 1.2 mg/dL   GFR, Estimated 51 (L) >60 mL/min   Anion gap 15 5 - 15  CBC WITH DIFFERENTIAL     Status: Abnormal   Collection Time: 03/18/2021 11:26 AM  Result Value Ref Range   WBC 17.1 (H) 4.0 - 10.5 K/uL   RBC 4.23 4.22 - 5.81 MIL/uL   Hemoglobin 13.2 13.0 - 17.0 g/dL   HCT 39.3 39.0 - 52.0 %   MCV 92.9 80.0 - 100.0 fL   MCH 31.2 26.0 - 34.0 pg   MCHC 33.6 30.0 - 36.0 g/dL   RDW 14.3 11.5 - 15.5 %   Platelets 213 150 - 400 K/uL   nRBC 0.0 0.0 - 0.2 %   Neutrophils Relative % 91 %   Neutro Abs 15.4 (H) 1.7 - 7.7 K/uL   Lymphocytes Relative 3 %   Lymphs Abs 0.6 (L) 0.7 - 4.0 K/uL   Monocytes Relative 5 %   Monocytes Absolute 0.9 0.1 - 1.0 K/uL   Eosinophils Relative 0 %   Eosinophils Absolute 0.0 0.0 - 0.5 K/uL   Basophils Relative 0 %   Basophils Absolute 0.0 0.0 - 0.1 K/uL   Immature Granulocytes 1 %   Abs Immature Granulocytes 0.15 (H) 0.00 - 0.07 K/uL  Protime-INR     Status: None  Collection Time: 03/16/2021 11:26 AM  Result Value Ref Range   Prothrombin Time 13.0 11.4 - 15.2 seconds   INR 1.0  0.8 - 1.2  APTT     Status: None   Collection Time: 03/16/2021 11:26 AM  Result Value Ref Range   aPTT 26 24 - 36 seconds  Urinalysis, Complete w Microscopic     Status: Abnormal   Collection Time: 02/28/2021 11:26 AM  Result Value Ref Range   Color, Urine AMBER (A) YELLOW   APPearance HAZY (A) CLEAR   Specific Gravity, Urine 1.020 1.005 - 1.030   pH 5.5 5.0 - 8.0   Glucose, UA NEGATIVE NEGATIVE mg/dL   Hgb urine dipstick LARGE (A) NEGATIVE   Bilirubin Urine SMALL (A) NEGATIVE   Ketones, ur 15 (A) NEGATIVE mg/dL   Protein, ur 100 (A) NEGATIVE mg/dL   Nitrite NEGATIVE NEGATIVE   Leukocytes,Ua NEGATIVE NEGATIVE   RBC / HPF 0-5 0 - 5 RBC/hpf   WBC, UA 6-10 0 - 5 WBC/hpf   Bacteria, UA RARE (A) NONE SEEN   Squamous Epithelial / LPF 0-5 0 - 5   Mucus PRESENT    Hyaline Casts, UA PRESENT    Granular Casts, UA PRESENT    Sperm, UA PRESENT    ____________________________________________  EKG My review and personal interpretation at Time: 11:52   Indication: sepsis  Rate: 110  Rhythm: suspect sinus with motion artifact Axis: left Other: poor r wave progression, abnml ekg ____________________________________________  RADIOLOGY  I personally reviewed all radiographic images ordered to evaluate for the above acute complaints and reviewed radiology reports and findings.  These findings were personally discussed with the patient.  Please see medical record for radiology report.  ____________________________________________   PROCEDURES  Procedure(s) performed:  .Critical Care Performed by: Merlyn Lot, MD Authorized by: Merlyn Lot, MD   Critical care provider statement:    Critical care time (minutes):  40   Critical care was necessary to treat or prevent imminent or life-threatening deterioration of the following conditions:  Sepsis and respiratory failure   Critical care was time spent personally by me on the following activities:  Ordering and performing treatments  and interventions, ordering and review of laboratory studies, ordering and review of radiographic studies, pulse oximetry, re-evaluation of patient's condition, review of old charts, obtaining history from patient or surrogate, examination of patient, evaluation of patient's response to treatment, discussions with primary provider, discussions with consultants and development of treatment plan with patient or surrogate .1-3 Lead EKG Interpretation Performed by: Merlyn Lot, MD Authorized by: Merlyn Lot, MD     Interpretation: abnormal     ECG rate:  105   ECG rate assessment: tachycardic     Rhythm: sinus tachycardia      Critical Care performed: yes ____________________________________________   INITIAL IMPRESSION / ASSESSMENT AND PLAN / ED COURSE  Pertinent labs & imaging results that were available during my care of the patient were reviewed by me and considered in my medical decision making (see chart for details).   DDX: Sepsis pneumonia, COVID, peritonitis, strangulated hernia, perforation, CHF, ACS  Travis Haynes is a 75 y.o. who presents to the ED with this presents to the ER for evaluation of shortness of breath and evidence of acute respiratory failure with hypoxia.  He is on supplemental oxygen protecting his airway recently had COVID-19 illness family refused remdesivir.  On Keflex for area of cellulitis but patient has area of ulceration with what appears to be peritoneal  or omental tissue concerning for exposed hernia sac.  Will consult surgery.  Will order IV fluids as well as broad-spectrum antibiotics for suspected sepsis.  Clinical Course as of 02/22/2021 1357  Fri Mar 09, 2021  1147 Surgery at bedside requesting CT imaging to further evaluate given concerning presentation.  Patient is ill-appearing on nonrebreather satting in the mid 90s tachypnea chest x-ray with diffuse pulmonary infiltrates possibly secondary to recent covid 19 diagnosis possible multilobar  pneumonia or CHF. [PR]    Clinical Course User Index [PR] Merlyn Lot, MD    The patient was evaluated in Emergency Department today for the symptoms described in the history of present illness. He/she was evaluated in the context of the global COVID-19 pandemic, which necessitated consideration that the patient might be at risk for infection with the SARS-CoV-2 virus that causes COVID-19. Institutional protocols and algorithms that pertain to the evaluation of patients at risk for COVID-19 are in a state of rapid change based on information released by regulatory bodies including the CDC and federal and state organizations. These policies and algorithms were followed during the patient's care in the ED.  As part of my medical decision making, I reviewed the following data within the [place] notes reviewed and incorporated, Labs reviewed, notes from prior ED visits and [place] Controlled Substance Database   ____________________________________________   FINAL CLINICAL IMPRESSION(S) / ED DIAGNOSES  Final diagnoses:  Sepsis with acute hypoxic respiratory failure, due to unspecified organism, unspecified whether septic shock present ([place])      NEW MEDICATIONS STARTED DURING THIS VISIT:  New Prescriptions   No medications on file     Note:  This document was prepared using Dragon voice recognition software and may include unintentional dictation errors.    Merlyn Lot, MD 03/01/2021 1357

## 2021-03-09 NOTE — Progress Notes (Signed)
An USGPIV (ultrasound guided PIV) has been placed for short-term vasopressor infusion. A correctly placed ivWatch must be used when administering Vasopressors. Should this treatment be needed beyond 72 hours, central line access should be obtained.  It will be the responsibility of the bedside nurse to follow best practice to prevent extravasations.   ?

## 2021-03-09 NOTE — Consult Note (Signed)
CRITICAL CARE PROGRESS NOTE    Name: Travis Haynes MRN: [id number] DOB: Sep 29, 1945     LOS: 0   SUBJECTIVE FINDINGS & SIGNIFICANT EVENTS    Patient description:    This is a 75yo M with hx of HTN, dyslipidemia RHA, CKD3, CAD, lymphoma, CHF EF40%, GERD, who came in with DOE/SOB at rest with abd pain.  Family reported AMS x 3d, they denied dementia at baseline.  He does have recent medical acute issue of abdominal wall cellulitis.He has PCR + on 03/08/21 with cycle threshold value 17.  He is partial code with OK to intubate NO cpr advanced directive. PCCM consultation for acute hypoxemic respiratory failure due to [place] with concomitant abd wall cellulitis.   I met with son and granddaughter who is RN here and we reviewed findings and medical plan.   Lines/tubes :   Microbiology/Sepsis markers: Results for orders placed or performed during the hospital encounter of 09/07/20  Resp Panel by RT-PCR (Flu A&B, Covid) Nasopharyngeal Swab     Status: None   Collection Time: 09/07/20  3:30 AM   Specimen: Nasopharyngeal Swab; Nasopharyngeal(NP) swabs in vial transport medium  Result Value Ref Range Status   SARS Coronavirus 2 by RT PCR NEGATIVE NEGATIVE Final    Comment: (NOTE) SARS-CoV-2 target nucleic acids are NOT DETECTED.  The SARS-CoV-2 RNA is generally detectable in upper respiratory specimens during the acute phase of infection. The lowest concentration of SARS-CoV-2 viral copies this assay can detect is 138 copies/mL. A negative result does not preclude SARS-Cov-2 infection and should not be used as the sole basis for treatment or other patient management decisions. A negative result may occur with  improper specimen collection/handling, submission of specimen other than nasopharyngeal swab, presence of  viral mutation(s) within the areas targeted by this assay, and inadequate number of viral copies(<138 copies/mL). A negative result must be combined with clinical observations, patient history, and epidemiological information. The expected result is Negative.  Fact Sheet for Patients:  EntrepreneurPulse.com.au  Fact Sheet for Healthcare Providers:  IncredibleEmployment.be  This test is no t yet approved or cleared by the [place] FDA and  has been authorized for detection and/or diagnosis of SARS-CoV-2 by FDA under an Emergency Use Authorization (EUA). This EUA will remain  in effect (meaning this test can be used) for the duration of the COVID-19 declaration under Section 564(b)(1) of the Act, 21 U.S.C.section 360bbb-3(b)(1), unless the authorization is terminated  or revoked sooner.       Influenza A by PCR NEGATIVE NEGATIVE Final   Influenza B by PCR NEGATIVE NEGATIVE Final    Comment: (NOTE) The Xpert Xpress SARS-CoV-2/FLU/RSV plus assay is intended as an aid in the diagnosis of influenza from Nasopharyngeal swab specimens and should not be used as a sole basis for treatment. Nasal washings and aspirates are unacceptable for Xpert Xpress SARS-CoV-2/FLU/RSV testing.  Fact Sheet for Patients: EntrepreneurPulse.com.au  Fact Sheet for Healthcare Providers: IncredibleEmployment.be  This test is not yet approved or cleared by the [place] FDA and has been authorized for detection and/or diagnosis of SARS-CoV-2 by FDA under an Emergency Use Authorization (EUA). This EUA will remain in effect (meaning this test can be used) for the duration of the COVID-19 declaration under Section 564(b)(1) of the Act, 21 U.S.C. section 360bbb-3(b)(1), unless the authorization is terminated or revoked.  Performed at [facility], [address] [place], [place] [address]     Anti-infectives:   Anti-infectives (From admission, onward)  Start     Dose/Rate Route Frequency Ordered Stop   03/10/21 1200  vancomycin (VANCOREADY) IVPB 750 mg/150 mL        750 mg 150 mL/hr over 60 Minutes Intravenous Every 24 hours 02/27/2021 1439     03/10/21 1000  remdesivir 100 mg in sodium chloride 0.9 % 100 mL IVPB       See Hyperspace for full Linked Orders Report.   100 mg 200 mL/hr over 30 Minutes Intravenous Daily 03/08/2021 1529 03/14/21 0959   03/10/21 0000  ceFEPIme (MAXIPIME) 2 g in sodium chloride 0.9 % 100 mL IVPB        2 g 200 mL/hr over 30 Minutes Intravenous Every 12 hours 03/10/2021 1439     03/17/2021 2200  metroNIDAZOLE (FLAGYL) IVPB 500 mg        500 mg 100 mL/hr over 60 Minutes Intravenous Every 12 hours 03/22/2021 1425     03/04/2021 1700  remdesivir 200 mg in sodium chloride 0.9% 250 mL IVPB       See Hyperspace for full Linked Orders Report.   200 mg 580 mL/hr over 30 Minutes Intravenous Once 03/24/2021 1529     03/22/2021 1200  vancomycin (VANCOREADY) IVPB 1250 mg/250 mL        1,250 mg 166.7 mL/hr over 90 Minutes Intravenous  Once 03/11/2021 1125 03/16/2021 1457   03/19/2021 1130  ceFEPIme (MAXIPIME) 2 g in sodium chloride 0.9 % 100 mL IVPB        2 g 200 mL/hr over 30 Minutes Intravenous  Once 03/02/2021 1122 03/06/2021 1246   02/23/2021 1130  metroNIDAZOLE (FLAGYL) IVPB 500 mg        500 mg 100 mL/hr over 60 Minutes Intravenous  Once 03/23/2021 1122 03/16/2021 1246        Consults: Treatment Team:  Vicki Mallet, MD     PAST MEDICAL HISTORY   Past Medical History:  Diagnosis Date   Arthritis    rheumatoid arthritis, takes prednisone for this   Barrett's esophagus    Cancer ([place]) 1999   non hodgkins lymphoma   Chronic back pain    Chronic pain syndrome    Claustrophobia    Coronary artery disease    GERD (gastroesophageal reflux disease)    barretts esophagus that requires dilation every couple years   Hemorrhoids    Hernia of abdominal  cavity    History of hiatal hernia    Hyperlipidemia    Hypertension    Lumbago    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   Wears dentures    Weight loss 11/2017   30 lb weight loss over 3 years     SURGICAL HISTORY   Past Surgical History:  Procedure Laterality Date   Jay   2 fusions then 1 surgery to fix the other two (lower)..plates and screws   CATARACT EXTRACTION W/PHACO Left 12/31/2018   Procedure: CATARACT EXTRACTION PHACO AND INTRAOCULAR LENS PLACEMENT ([place]) LEFT VISION BLUE;  Surgeon: Marchia Meiers, MD;  Location: [facility] ORS;  Service: Ophthalmology;  Laterality: Left;  Lot #[id number] H [place]: 01:51.7 CDE: 18.59   CYSTECTOMY     from buttocks   ESOPHAGEAL DILATION     multiple times   ESOPHAGOGASTRODUODENOSCOPY (EGD) WITH PROPOFOL N/A 12/05/2014   Procedure: ESOPHAGOGASTRODUODENOSCOPY (EGD) WITH PROPOFOL;  Surgeon: Hulen Luster, MD;  Location: [facility] ENDOSCOPY;  Service: Gastroenterology;  Laterality: N/A;   EXCISION  PARTIAL PHALANX Right 12/09/2017   Procedure: EXCISION PARTIAL PHALANX-GREAT TOE;  Surgeon: Sharlotte Alamo, DPM;  Location: [facility] ORS;  Service: Podiatry;  Laterality: Right;   FOOT SURGERY     intestine repair  1998   during biopsy his intestines were nicked requiring repair   MULTIPLE TOOTH EXTRACTIONS     NECK SURGERY  1996   fused 2 vertebrae in neck (plate plus screws)   RADIOLOGY WITH ANESTHESIA N/A 03/09/2019   Procedure: MRI WITH ANESTHESIA LUMBAR WITHOUT CONTRAST;  Surgeon: Radiologist, Medication, MD;  Location: [facility];  Service: Radiology;  Laterality: N/A;   RADIOLOGY WITH ANESTHESIA N/A 09/23/2019   Procedure: MRI WITH ANESTHESIA; LUMBAR SPINE WITHOUT CONTRAST;  Surgeon: Radiologist, Medication, MD;  Location: [facility];  Service: Radiology;  Laterality: N/A;   Reduction masseter muscle/bone extraoral       FAMILY HISTORY   Family History  Problem Relation Age of Onset   Heart disease Mother        heart  attack   Arthritis Mother    Arthritis Father      SOCIAL HISTORY   Social History   Tobacco Use   Smoking status: Former    Types: Cigarettes    Quit date: 1998    Years since quitting: 24.9   Smokeless tobacco: Never  Vaping Use   Vaping Use: Never used  Substance Use Topics   Alcohol use: No    Comment: stopped d/t cluster headaches   Drug use: No     MEDICATIONS   Current Medication:  Current Facility-Administered Medications:    albuterol (PROVENTIL) (2.5 MG/3ML) 0.083% nebulizer solution 2.5 mg, 2.5 mg, Nebulization, Q4H PRN, Ivor Costa, MD   [START ON 03/10/2021] ceFEPIme (MAXIPIME) 2 g in sodium chloride 0.9 % 100 mL IVPB, 2 g, Intravenous, Q12H, Rauer, Samantha O, RPH   dextromethorphan-guaiFENesin (MUCINEX DM) 30-600 MG per 12 hr tablet 1 tablet, 1 tablet, Oral, BID PRN, Ivor Costa, MD   enoxaparin (LOVENOX) injection 40 mg, 40 mg, Subcutaneous, Q24H, Ivor Costa, MD   hydrALAZINE (APRESOLINE) injection 5 mg, 5 mg, Intravenous, Q2H PRN, Ivor Costa, MD   ipratropium-albuterol (DUONEB) 0.5-2.5 (3) MG/3ML nebulizer solution 3 mL, 3 mL, Nebulization, Q4H, Ivor Costa, MD   methylPREDNISolone sodium succinate (SOLU-MEDROL) 40 mg/mL injection 40 mg, 40 mg, Intravenous, Q12H, Ivor Costa, MD   metroNIDAZOLE (FLAGYL) IVPB 500 mg, 500 mg, Intravenous, Q12H, Ivor Costa, MD   remdesivir 200 mg in sodium chloride 0.9% 250 mL IVPB, 200 mg, Intravenous, Once **FOLLOWED BY** [START ON 03/10/2021] remdesivir 100 mg in sodium chloride 0.9 % 100 mL IVPB, 100 mg, Intravenous, Daily, Rauer, Samantha O, RPH   [START ON 03/10/2021] vancomycin (VANCOREADY) IVPB 750 mg/150 mL, 750 mg, Intravenous, Q24H, Rauer, Forde Dandy, RPH  Current Outpatient Medications:    cephALEXin (KEFLEX) 500 MG capsule, Take 500 mg by mouth in the morning, at noon, in the evening, and at bedtime., Disp: , Rfl:    predniSONE (DELTASONE) 5 MG tablet, Take 1.5 tablets by mouth daily., Disp: , Rfl:    acetaminophen  (TYLENOL) 500 MG tablet, Take 1,000 mg by mouth every 6 (six) hours as needed for mild pain or moderate pain. , Disp: , Rfl:    albuterol (VENTOLIN HFA) 108 (90 Base) MCG/ACT inhaler, Inhale 2 puffs into the lungs every 6 (six) hours as needed for wheezing or shortness of breath., Disp: 18 g, Rfl: 4   amLODipine (NORVASC) 5 MG tablet, TAKE ONE (1) TABLET BY MOUTH EACH DAY (  Patient taking differently: Take 5 mg by mouth daily. TAKE ONE (1) TABLET BY MOUTH EACH DAY), Disp: 90 tablet, Rfl: 4   aspirin EC 81 MG tablet, Take 1 tablet (81 mg total) by mouth daily., Disp: 90 tablet, Rfl: 12   atorvastatin (LIPITOR) 10 MG tablet, Take 1 tablet (10 mg total) by mouth daily., Disp: 90 tablet, Rfl: 4   Cholecalciferol (VITAMIN D-3) 1000 units CAPS, Take 1,000 Units by mouth daily., Disp: , Rfl:    furosemide (LASIX) 40 MG tablet, Take 1 tablet (40 mg total) by mouth as needed for edema (daily as needed only for edema)., Disp: 90 tablet, Rfl: 0   gabapentin (NEURONTIN) 100 MG capsule, Take 100 mg by mouth 3 (three) times daily., Disp: , Rfl:    hydrochlorothiazide (HYDRODIURIL) 12.5 MG tablet, Take 1 tablet (12.5 mg total) by mouth daily. Stop taking 25 MG tablets., Disp: 90 tablet, Rfl: 4   naproxen sodium (ALEVE) 220 MG tablet, Take 220-440 mg by mouth See admin instructions. Take 440 mg by mouth in the morning at 12pm. and 220 mg in the evening 12 AM, Disp: , Rfl:    omeprazole (PRILOSEC) 20 MG capsule, Take 2 capsules (40 mg total) by mouth daily., Disp: 180 capsule, Rfl: 4   sulfaSALAzine (AZULFIDINE) 500 MG EC tablet, Take by mouth., Disp: , Rfl:    traMADol (ULTRAM) 50 MG tablet, Take 1 tablet (50 mg total) by mouth daily as needed. (Patient taking differently: Take 50 mg by mouth every 4 (four) hours.), Disp: 20 tablet, Rfl: 0   vitamin B-12 (CYANOCOBALAMIN) 1000 MCG tablet, Take 1,000 mcg by mouth daily., Disp: , Rfl:     ALLERGIES   Ativan [lorazepam] and Gold au 198 [gold]    REVIEW OF SYSTEMS      10 point ROS done and patient is confused with encephalopathy  PHYSICAL EXAMINATION   Vital Signs: Temp:  [97.8 F (36.6 C)] 97.8 F (36.6 C) (12/16 1110) Pulse Rate:  [99-113] 99 (12/16 1530) Resp:  [23-37] 25 (12/16 1330) BP: (117-144)/(77-99) 144/99 (12/16 1530) SpO2:  [91 %-96 %] 95 % (12/16 1530) Weight:  [59 kg] 59 kg (12/16 1104)  GENERAL:Age appropriate HEAD: Normocephalic, atraumatic.  EYES: Pupils equal, round, reactive to light.  No scleral icterus.  MOUTH: Moist mucosal membrane. NECK: Supple. No thyromegaly. No nodules. No JVD.  PULMONARY: Rhonchi bilaterally  CARDIOVASCULAR: S1 and S2. Regular rate and rhythm. No murmurs, rubs, or gallops.  GASTROINTESTINAL: Soft +tender, +distended. No masses. Negative bowel sounds. No hepatosplenomegaly.  MUSCULOSKELETAL: No swelling, clubbing, or edema.  NEUROLOGIC: Mild distress due to acute illness GCS9 SKIN:intact,warm,dry   PERTINENT DATA     Infusions:  [START ON 03/10/2021] ceFEPime (MAXIPIME) IV     metronidazole     remdesivir 200 mg in sodium chloride 0.9% 250 mL IVPB     Followed by   Derrill Memo ON 03/10/2021] remdesivir 100 mg in NS 100 mL     [START ON 03/10/2021] vancomycin     Scheduled Medications:  enoxaparin (LOVENOX) injection  40 mg Subcutaneous Q24H   ipratropium-albuterol  3 mL Nebulization Q4H   methylPREDNISolone (SOLU-MEDROL) injection  40 mg Intravenous Q12H   PRN Medications: albuterol, dextromethorphan-guaiFENesin, hydrALAZINE Hemodynamic parameters:   Intake/Output: No intake/output data recorded.  Ventilator  Settings:     LAB RESULTS:  Basic Metabolic Panel: Recent Labs  Lab 03/21/2021 1126  NA 135  K 3.5  CL 96*  CO2 24  GLUCOSE 92  BUN 38*  CREATININE 1.43*  CALCIUM 8.5*   Liver Function Tests: Recent Labs  Lab 03/03/2021 1126  AST 332*  ALT 72*  ALKPHOS 82  BILITOT 1.3*  PROT 6.6  ALBUMIN 3.0*   No results for input(s): LIPASE, AMYLASE in the last 168  hours. No results for input(s): AMMONIA in the last 168 hours. CBC: Recent Labs  Lab 03/08/2021 1126  WBC 17.1*  NEUTROABS 15.4*  HGB 13.2  HCT 39.3  MCV 92.9  PLT 213   Cardiac Enzymes: No results for input(s): CKTOTAL, CKMB, CKMBINDEX, TROPONINI in the last 168 hours. BNP: Invalid input(s): POCBNP CBG: No results for input(s): GLUCAP in the last 168 hours.     IMAGING RESULTS:  Imaging: CT ABDOMEN PELVIS W CONTRAST  Result Date: 03/22/2021 CLINICAL DATA:  Abdominal distension, being treated for open wound from hernia surgery concern for bowel obstruction. EXAM: CT ABDOMEN AND PELVIS WITH CONTRAST TECHNIQUE: Multidetector CT imaging of the abdomen and pelvis was performed using the standard protocol following bolus administration of intravenous contrast. CONTRAST:  171m OMNIPAQUE IOHEXOL 300 MG/ML  SOLN COMPARISON:  None. FINDINGS: Lower chest: Mosaic attenuation of the lung bases with geographic regions of ground-glass opacities and more nodular consolidative areas. Normal size heart. Three-vessel coronary artery disease. Hepatobiliary: No suspicious hepatic lesion. Gallbladder is distended without wall thickening or pericholecystic fluid. No biliary ductal dilation. Pancreas: No pancreatic ductal dilation or evidence of acute inflammation. Diffuse pancreatic atrophy. Spleen: Within normal limits. Adrenals/Urinary Tract: Bilateral adrenal glands are unremarkable. No hydronephrosis. Left renal cysts measuring up to 3.2 cm. Urinary bladder is unremarkable for degree of distension. Stomach/Bowel: Large hiatal hernia. No pathologic dilation of large or small bowel. No evidence of acute bowel inflammation. Large fat and nonobstructed bowel containing ventral hernia with a 6.4 cm aperture width. Laxity of the ventral abdominal wall containing fat and bowel which appears to extend to the left paramedian cutaneous surface. Vascular/Lymphatic: Aortic and branch vessel atherosclerosis without  abdominal aortic aneurysm. No pathologically enlarged abdominal or pelvic lymph nodes. Reproductive: Prostate is unremarkable. Other: No significant abdominopelvic free fluid. Musculoskeletal: Degenerative and postoperative changes of the spine. Similar appearance of the T9 and T11 vertebral compression deformities. Avascular necrosis of the left femoral head. IMPRESSION: 1. No evidence of bowel obstruction. 2. Large fat and nonobstructed bowel containing ventral hernia with a 6.4 cm aperture. 3. Laxity of the ventral abdominal wall with peritoneal fat and loops of nonobstructed bowel extending to the cutaneous skin surface. 4. Mosaic attenuation of the lung bases with geographic regions of ground-glass opacities and more nodular consolidative areas likely reflects infectious or inflammatory process. 5. Large hiatal hernia. 6. Avascular necrosis of the left femoral head. 7. Similar appearance of the T9 and T11 vertebral compression deformities. 8.  Aortic Atherosclerosis (ICD10-I70.0). Electronically Signed   By: JDahlia BailiffM.D.   On: 03/15/2021 13:46   DG Chest Port 1 View  Result Date: 03/24/2021 CLINICAL DATA:  Questionable sepsis.  Evaluate for abnormality. EXAM: PORTABLE CHEST 1 VIEW COMPARISON:  09/06/2020 FINDINGS: Patchy densities throughout the mid and lower right chest. Patchy densities in the left lower chest. These lung densities are new. Surgical hardware in the lower cervical spine. Heart size is grossly stable. IMPRESSION: Bilateral lung densities, right side greater than left. Findings are suggestive for bilateral pneumonia. Electronically Signed   By: AMarkus DaftM.D.   On: 03/03/2021 11:42   @PROBHOSP @ CT ABDOMEN PELVIS W CONTRAST  Result Date: 03/05/2021 CLINICAL DATA:  Abdominal distension, being treated  for open wound from hernia surgery concern for bowel obstruction. EXAM: CT ABDOMEN AND PELVIS WITH CONTRAST TECHNIQUE: Multidetector CT imaging of the abdomen and pelvis was  performed using the standard protocol following bolus administration of intravenous contrast. CONTRAST:  134m OMNIPAQUE IOHEXOL 300 MG/ML  SOLN COMPARISON:  None. FINDINGS: Lower chest: Mosaic attenuation of the lung bases with geographic regions of ground-glass opacities and more nodular consolidative areas. Normal size heart. Three-vessel coronary artery disease. Hepatobiliary: No suspicious hepatic lesion. Gallbladder is distended without wall thickening or pericholecystic fluid. No biliary ductal dilation. Pancreas: No pancreatic ductal dilation or evidence of acute inflammation. Diffuse pancreatic atrophy. Spleen: Within normal limits. Adrenals/Urinary Tract: Bilateral adrenal glands are unremarkable. No hydronephrosis. Left renal cysts measuring up to 3.2 cm. Urinary bladder is unremarkable for degree of distension. Stomach/Bowel: Large hiatal hernia. No pathologic dilation of large or small bowel. No evidence of acute bowel inflammation. Large fat and nonobstructed bowel containing ventral hernia with a 6.4 cm aperture width. Laxity of the ventral abdominal wall containing fat and bowel which appears to extend to the left paramedian cutaneous surface. Vascular/Lymphatic: Aortic and branch vessel atherosclerosis without abdominal aortic aneurysm. No pathologically enlarged abdominal or pelvic lymph nodes. Reproductive: Prostate is unremarkable. Other: No significant abdominopelvic free fluid. Musculoskeletal: Degenerative and postoperative changes of the spine. Similar appearance of the T9 and T11 vertebral compression deformities. Avascular necrosis of the left femoral head. IMPRESSION: 1. No evidence of bowel obstruction. 2. Large fat and nonobstructed bowel containing ventral hernia with a 6.4 cm aperture. 3. Laxity of the ventral abdominal wall with peritoneal fat and loops of nonobstructed bowel extending to the cutaneous skin surface. 4. Mosaic attenuation of the lung bases with geographic regions of  ground-glass opacities and more nodular consolidative areas likely reflects infectious or inflammatory process. 5. Large hiatal hernia. 6. Avascular necrosis of the left femoral head. 7. Similar appearance of the T9 and T11 vertebral compression deformities. 8.  Aortic Atherosclerosis (ICD10-I70.0). Electronically Signed   By: JDahlia BailiffM.D.   On: 03/18/2021 13:46   DG Chest Port 1 View  Result Date: 03/08/2021 CLINICAL DATA:  Questionable sepsis.  Evaluate for abnormality. EXAM: PORTABLE CHEST 1 VIEW COMPARISON:  09/06/2020 FINDINGS: Patchy densities throughout the mid and lower right chest. Patchy densities in the left lower chest. These lung densities are new. Surgical hardware in the lower cervical spine. Heart size is grossly stable. IMPRESSION: Bilateral lung densities, right side greater than left. Findings are suggestive for bilateral pneumonia. Electronically Signed   By: AMarkus DaftM.D.   On: 02/28/2021 11:42        ASSESSMENT AND PLAN    -Multidisciplinary rounds held today  Acute COVID19 pneumonia -Remdesevir antiviral - pharmacy protocol 5 d -vitamin C -zinc -steroids - currently solumedrol 40 bid -Diuresis - Lasix 40 IV daily - monitor UOP - utilize external urinary catheter if possible -Self prone if patient can tolerate  -encourage to use IS and Acapella device for bronchopulmonary hygiene when able -d/c hepatotoxic medications while on remdesevir -supportive care with ICU telemetry monitoring -PT/OT when possible -procalcitonin, CRP and ferritin trending -on HFNC - reviewed with RT  Abdominal wall cellulitis -ventral hernia - surgery on case appreciate management and recommendations -CT abdomen- 1. No evidence of bowel obstruction. 2. Large fat and nonobstructed bowel containing ventral hernia with a 6.4 cm aperture. 3. Laxity of the ventral abdominal wall with peritoneal fat and loops of nonobstructed bowel extending to the cutaneous skin surface. ICU  monitoring-    Altered mental status with encephalopathy  Due to COVID19    - there is aggitation with hyperactive delerium and confusion - have initiated low dose precedex.     Renal Failure-most likely due to ATN -follow chem 7 -follow UO -continue Foley Catheter-assess need daily   Avascular necrosis of left femoral head    - chronicity unknown    - will address post recovery from COVID    -elevated risk for BM emobli -    Septic shock -use vasopressors to keep MAP>65 -follow ABG and LA -follow up cultures -emperic ABX -consider stress dose steroids   ID -continue IV abx as prescibed -follow up cultures   GI/Nutrition GI PROPHYLAXIS as indicated DIET-->TF's as tolerated Constipation protocol as indicated  ENDO - ICU hypoglycemic\Hyperglycemia protocol -check FSBS per protocol   ELECTROLYTES -follow labs as needed -replace as needed -pharmacy consultation   DVT/GI PRX ordered -SCDs  TRANSFUSIONS AS NEEDED MONITOR FSBS ASSESS the need for LABS as needed   Critical care provider statement:   Total critical care time: 109 minutes   Performed by: Lanney Gins MD   Critical care time was exclusive of separately billable procedures and treating other patients.   Critical care was necessary to treat or prevent imminent or life-threatening deterioration.   Critical care was time spent personally by me on the following activities: development of treatment plan with patient and/or surrogate as well as nursing, discussions with consultants, evaluation of patient's response to treatment, examination of patient, obtaining history from patient or surrogate, ordering and performing treatments and interventions, ordering and review of laboratory studies, ordering and review of radiographic studies, pulse oximetry and re-evaluation of patient's condition.    Ottie Glazier, M.D.  Pulmonary & Critical Care Medicine        Ottie Glazier, M.D.  Division of  [facility]

## 2021-03-09 NOTE — Progress Notes (Signed)
GOALS OF CARE FAMILY CONFERENCE   Current clinical status, hospital findings and medical plan was reviewed with family.   Updated and notified of patients ongoing immediate critical medical problems.   Patient remains with encephalopathy   Patient is unable to breathe independently, unable to protect airway and unable to mobilize secretions.    Explained to family course of therapy and the modalities   Patient with Progressive multiorgan failure with high risk of death.   Family is appreciative of care and relate understanding that patient is severely critically ill with severe comorbid status as well as acutely ill with COVID19  They have consented and agreed to DNR/DNI  Code status   Family are satisfied with Plan of action and management. All questions answered  Additional Critical Care time 35 mins    Travis Haynes, M.D.  Pulmonary & [place]

## 2021-03-09 NOTE — Progress Notes (Signed)
Pharmacy Antibiotic Note  Travis Haynes is a 75 y.o. male admitted on 03/08/2021 with abdominal wall cellulitis and sepsis.  Pharmacy has been consulted for vancomycin and cefepime dosing.  Plan: Cefepime 2 g IV q12h  Pt received vancomycin 1250 mg IV x 1 loading. Will continue with vancomycin 750 mg q24h  Est AUC: 493 Used: Scr 1.43, TBW, Vd 0.72 Obtain vanc levels around 4th or 5th dose if continued  Monitor renal function and adjust dose if clinically indicated  Height: 5\' 8"  (172.7 cm) Weight: 59 kg (130 lb) IBW/kg (Calculated) : 68.4  Temp (24hrs), Avg:97.8 F (36.6 C), Min:97.8 F (36.6 C), Max:97.8 F (36.6 C)  Recent Labs  Lab 02/24/2021 1126  WBC 17.1*  CREATININE 1.43*  LATICACIDVEN 3.1*    Estimated Creatinine Clearance: 37.8 mL/min (A) (by C-G formula based on SCr of 1.43 mg/dL (H)).    Allergies  Allergen Reactions   Ativan [Lorazepam] Other (See Comments)    Pt states medication made him hyper   Gold Au 198 [Gold] Other (See Comments)    Pt states platelet count was low. This was in 15.    Antimicrobials this admission: 12/16 cefepime >>  12/16 metronidazole >>  12/16 vancomycin >>   Microbiology results: 12/16 BCx: sent  Thank you for allowing pharmacy to be a part of this patients care.  Jasiri Hanawalt O Destynee Stringfellow 02/27/2021 2:26 PM

## 2021-03-09 NOTE — ED Notes (Signed)
Verbal report to Ethelene Hal RN ICU

## 2021-03-10 ENCOUNTER — Inpatient Hospital Stay: Payer: Medicare Other

## 2021-03-10 LAB — COMPREHENSIVE METABOLIC PANEL
ALT: 66 U/L — ABNORMAL HIGH (ref 0–44)
AST: 196 U/L — ABNORMAL HIGH (ref 15–41)
Albumin: 2.4 g/dL — ABNORMAL LOW (ref 3.5–5.0)
Alkaline Phosphatase: 68 U/L (ref 38–126)
Anion gap: 17 — ABNORMAL HIGH (ref 5–15)
BUN: 38 mg/dL — ABNORMAL HIGH (ref 8–23)
CO2: 21 mmol/L — ABNORMAL LOW (ref 22–32)
Calcium: 7.6 mg/dL — ABNORMAL LOW (ref 8.9–10.3)
Chloride: 103 mmol/L (ref 98–111)
Creatinine, Ser: 1.28 mg/dL — ABNORMAL HIGH (ref 0.61–1.24)
GFR, Estimated: 59 mL/min — ABNORMAL LOW (ref >60)
Glucose, Bld: 134 mg/dL — ABNORMAL HIGH (ref 70–99)
Potassium: 3.6 mmol/L (ref 3.5–5.1)
Sodium: 141 mmol/L (ref 135–145)
Total Bilirubin: 1.5 mg/dL — ABNORMAL HIGH (ref 0.3–1.2)
Total Protein: 5.8 g/dL — ABNORMAL LOW (ref 6.5–8.1)

## 2021-03-10 LAB — CBC WITH DIFFERENTIAL/PLATELET
Abs Immature Granulocytes: 0.06 10*3/uL (ref 0.00–0.07)
Basophils Absolute: 0 10*3/uL (ref 0.0–0.1)
Basophils Relative: 0 %
Eosinophils Absolute: 0 10*3/uL (ref 0.0–0.5)
Eosinophils Relative: 0 %
HCT: 35.2 % — ABNORMAL LOW (ref 39.0–52.0)
Hemoglobin: 11.6 g/dL — ABNORMAL LOW (ref 13.0–17.0)
Immature Granulocytes: 1 %
Lymphocytes Relative: 6 %
Lymphs Abs: 0.8 10*3/uL (ref 0.7–4.0)
MCH: 30.9 pg (ref 26.0–34.0)
MCHC: 33 g/dL (ref 30.0–36.0)
MCV: 93.6 fL (ref 80.0–100.0)
Monocytes Absolute: 0.9 10*3/uL (ref 0.1–1.0)
Monocytes Relative: 7 %
Neutro Abs: 11.4 10*3/uL — ABNORMAL HIGH (ref 1.7–7.7)
Neutrophils Relative %: 86 %
Platelets: 197 10*3/uL (ref 150–400)
RBC: 3.76 MIL/uL — ABNORMAL LOW (ref 4.22–5.81)
RDW: 14.4 % (ref 11.5–15.5)
WBC: 13.2 10*3/uL — ABNORMAL HIGH (ref 4.0–10.5)
nRBC: 0 % (ref 0.0–0.2)

## 2021-03-10 LAB — C-REACTIVE PROTEIN: CRP: 21.5 mg/dL — ABNORMAL HIGH (ref <1.0)

## 2021-03-10 LAB — D-DIMER, QUANTITATIVE: D-Dimer, Quant: 14.9 ug/mL-FEU — ABNORMAL HIGH (ref 0.00–0.50)

## 2021-03-10 LAB — PHOSPHORUS: Phosphorus: 5.1 mg/dL — ABNORMAL HIGH (ref 2.5–4.6)

## 2021-03-10 LAB — MAGNESIUM: Magnesium: 2.1 mg/dL (ref 1.7–2.4)

## 2021-03-10 LAB — FERRITIN: Ferritin: 395 ng/mL — ABNORMAL HIGH (ref 24–336)

## 2021-03-10 LAB — LACTIC ACID, PLASMA: Lactic Acid, Venous: 1.6 mmol/L (ref 0.5–1.9)

## 2021-03-10 MED ORDER — HALOPERIDOL LACTATE 5 MG/ML IJ SOLN
2.0000 mg | INTRAMUSCULAR | Status: AC
  Administered 2021-03-10: 2 mg via INTRAVENOUS

## 2021-03-10 MED ORDER — HALOPERIDOL LACTATE 5 MG/ML IJ SOLN: 2.0000 mg | Freq: Once | INTRAMUSCULAR | Status: DC

## 2021-03-10 MED ORDER — HALOPERIDOL LACTATE 5 MG/ML IJ SOLN
INTRAMUSCULAR | Status: AC
  Filled 2021-03-10: qty 1

## 2021-03-10 MED ORDER — VANCOMYCIN HCL IN DEXTROSE 1-5 GM/200ML-% IV SOLN
1000.0000 mg | INTRAVENOUS | Status: DC
Start: 2021-03-10 — End: 2021-03-12
  Administered 2021-03-10 – 2021-03-11 (×2): 1000 mg via INTRAVENOUS
  Filled 2021-03-10 (×3): qty 200

## 2021-03-10 MED ORDER — CHLORHEXIDINE GLUCONATE CLOTH 2 % EX PADS
6.0000 | MEDICATED_PAD | Freq: Every day | CUTANEOUS | Status: DC
  Administered 2021-03-10 – 2021-03-12 (×3): 6 via TOPICAL

## 2021-03-10 MED ORDER — HALOPERIDOL LACTATE 5 MG/ML IJ SOLN
5.0000 mg | Freq: Once | INTRAMUSCULAR | Status: AC
  Administered 2021-03-10: 5 mg via INTRAVENOUS

## 2021-03-10 NOTE — Progress Notes (Signed)
[place] [facility] Day(s): 1.   Interval History: Patient seen and examined, overnight he became agitated requiring a dose of Haldol.  Remains on high flow nasal cannula in the ICU.  He is slightly more alert this morning.  He has no complaints of abdominal pain, nausea, or vomiting.  No bowel movements documented overnight, and patient unsure if he passed flatus.  Leukocytosis improved from 17.1 to 13.2.  Creatinine also improved to 1.28 from 1.43  Review of Systems:  Constitutional: denies fever, chills  HEENT: denies cough or congestion  Respiratory: denies any shortness of breath  Cardiovascular: denies chest pain or palpitations  Gastrointestinal: denies abdominal pain, N/V, or diarrhea/ Genitourinary: denies burning with urination or urinary frequency Musculoskeletal: denies pain, decreased motor or sensation Integumentary: Confirms abdominal wall erythema  Vital signs in last 24 hours: [min-max] current  Temp:  [97.6 F (36.4 C)-98 F (36.7 C)] 97.8 F (36.6 C) (12/17 0800) Pulse Rate:  [70-113] 76 (12/17 0800) Resp:  [18-37] 19 (12/17 0900) BP: (72-160)/(48-141) 98/53 (12/17 0900) SpO2:  [87 %-100 %] 99 % (12/17 0800) FiO2 (%):  [100 %] 100 % (12/17 0745) Weight:  [58.3 kg-59 kg] 58.3 kg (12/16 1611)     Height: 5\' 8"  (172.7 cm) Weight: 58.3 kg BMI (Calculated): 19.55   Intake/Output last 2 shifts:  12/16 0701 - 12/17 0700 In: 1799.9 [I.V.:1251.9; IV Piggyback:548] Out: 1100 [Urine:1100]   Physical Exam:  Constitutional: alert, cooperative and no distress  HENT: normocephalic without obvious abnormality  Eyes: PERRL, EOM's grossly intact and symmetric  Respiratory: breathing non-labored at rest, on high flow nasal cannula Cardiovascular: regular rate and sinus rhythm  Gastrointestinal:, Nondistended, nontender, no percussion tenderness or peritoneal signs, no rigidity, guarding, rebound tenderness.  Numerous ventral wall  hernias, all easily reducible.  Area of erythema overlying inferior most ventral hernia slightly improved, 1 cm opening noted with underlying fat visible   Labs:  CBC Latest Ref Rng & Units 03/10/2021 02/22/2021 11/03/2020  WBC 4.0 - 10.5 K/uL 13.2(H) 17.1(H) 10.7  Hemoglobin 13.0 - 17.0 g/dL 11.6(L) 13.2 14.8  Hematocrit 39.0 - 52.0 % 35.2(L) 39.3 43.7  Platelets 150 - 400 K/uL 197 213 249   CMP Latest Ref Rng & Units 03/10/2021 02/24/2021 11/03/2020  Glucose 70 - 99 mg/dL 134(H) 92 -  BUN 8 - 23 mg/dL 38(H) 38(H) -  Creatinine 0.61 - 1.24 mg/dL 1.28(H) 1.43(H) -  Sodium 135 - 145 mmol/L 141 135 -  Potassium 3.5 - 5.1 mmol/L 3.6 3.5 3.5  Chloride 98 - 111 mmol/L 103 96(L) -  CO2 22 - 32 mmol/L 21(L) 24 -  Calcium 8.9 - 10.3 mg/dL 7.6(L) 8.5(L) -  Total Protein 6.5 - 8.1 g/dL 5.8(L) 6.6 -  Total Bilirubin 0.3 - 1.2 mg/dL 1.5(H) 1.3(H) -  Alkaline Phos 38 - 126 U/L 68 82 -  AST 15 - 41 U/L 196(H) 332(H) -  ALT 0 - 44 U/L 66(H) 72(H) -    Imaging studies: No new pertinent imaging studies   Assessment/Plan:  75 y.o. male who was admitted with shortness of breath and difficulty breathing and known to be COVID-positive.  General surgery following for ventral hernia with overlying erythema and skin defect, complicated by pertinent comorbidities including rheumatoid arthritis, COVID-positive status   -Patient's family remains in agreement with plan for conservative management for skin defect overlying hernia secondary to his current comorbidities including severe COVID 19 infection, rheumatoid arthritis on prednisone 7.5 mg.  -  Continue daily dressing changes with Xeroform or Vaseline gauze overlying skin defect  -This area begins to leak fluid, would recommend placement of ostomy appliance to prevent excoriation of surrounding skin  -Continue broad-spectrum antibiotics per medical team  -Remainder of care per primary and ICU teams  All of the above findings and recommendations were  discussed with the patient's family, and the medical team, and all of patient's family's questions were answered to their expressed satisfaction.   -- Graciella Freer, DO

## 2021-03-10 NOTE — Progress Notes (Signed)
Patient extremely agitated, son at patient bedside requesting restraints for the patient.  Precedex 1.71mcg infusing with minimal relief. MD notified orders received.

## 2021-03-10 NOTE — Progress Notes (Signed)
1430 Patients oxygen saturation dropped to 84%. Patient had been weaned to 75% but it had been at least an hour since wean. Had tolerated earlier wean of HFNC to 80% without issues. Granddaughter in. Agitation had increased. Dr Lanney Gins consulted and he talked to family. No escalation in care ie BiPAP. HFNC increased to 100%/50 L. Extra dose of Haldol given per order. Patient calmer and sleepy now. Granddaughter calmer as well.

## 2021-03-10 NOTE — Consult Note (Signed)
[facility] Nurse Consult Note: Reason for Consult:Simultaneously consulted with General Surgery for area of erythema and skin defect over ventral hernia. Surgery saw yesterday and provided guidance/recommendations for conservative management using xeroform gauze topped with dry gauze and secured with paper tape.   Wound type: surgical Pressure Injury POA: N/A Dressing procedure/placement/frequency: I am in agreement with the POC established by General Surgery yesterday for this area.  Additionally, I have added pressure injury prevention interventions for Nursing I.e., a sacral foam dressing, flotation of heels and while patient is on a mattress replacement in the ICU, have reinforced the importance of turning and repositioning.  [facility] nursing team will not follow, but will remain available to this patient, the nursing and medical teams.  Please re-consult if needed. Thanks, Maudie Flakes, MSN, RN, [place], Arther Abbott  Pager# [phone]

## 2021-03-10 NOTE — Progress Notes (Signed)
1900 Patient remains confused. Granddaughter still with patient. No real change. HFNC remains on 100%.

## 2021-03-10 NOTE — Progress Notes (Addendum)
Patient remains confused. Granddaughter in to sit with patient.

## 2021-03-10 NOTE — Consult Note (Signed)
PHARMACY CONSULT NOTE - FOLLOW UP  Pharmacy Consult for Electrolyte Monitoring and Replacement   Recent Labs: Potassium (mmol/L)  Date Value  03/10/2021 3.6   Magnesium (mg/dL)  Date Value  03/10/2021 2.1   Calcium (mg/dL)  Date Value  03/10/2021 7.6 (L)   Albumin (g/dL)  Date Value  03/10/2021 2.4 (L)  10/06/2020 4.4   Phosphorus (mg/dL)  Date Value  03/10/2021 5.1 (H)   Sodium (mmol/L)  Date Value  03/10/2021 141  10/06/2020 138     Assessment:  75yo M presented with acute hypoxemic respiratory failure due to COVID19 with concomitant abd wall cellulitis. Pharmacy has been consulted for monitoring and replacement of electrolytes.  Goal of Therapy:  Electrolytes WNL  Plan:  No additional electrolyte replacement warranted at this time Recheck electrolytes with AM labs  Oswald Hillock ,PharmD,BCPS Clinical Pharmacist 03/10/2021 11:27 AM

## 2021-03-10 NOTE — Progress Notes (Signed)
PROGRESS NOTE    Travis Haynes  ZDG:[id number] DOB: [birth year] DOA: 03/20/2021 PCP: Venita Lick, NP   Brief Narrative: Taken from H&P and prior notes.  Travis Haynes is a 75 y.o. male with medical history significant of hypertension, hyperlipidemia, GERD, rheumatoid arthritis, CKD stage IIIa, CAD, non-Hodgkin lymphoma, CHF with EF of 40-45%, TV (s/p of treatment 2015), GERD, who presents with altered mental status, shortness breath, abdominal pain.   Per his son at bedside, patient had positive COVID test on 12/15, and was noted to become confused.  Normally patient is alert oriented x3.  He was also found to have increased edema and erythema of a chronic ventral hernia, they have noticed that it has been protruded recently and becoming more painful and erythematous.  Patient was started on Keflex as an outpatient with no improvement. On arrival he had leukocytosis, lactic acidosis, AKI, transaminitis, tachycardia and tachypnea.  He was desaturating to 88%, started requiring higher level of oxygen, currently on 55 L of heated high flow at 100% FiO2. Family is refusing remdesivir as they were very concerned about his liver and kidneys. PCCM was consulted due to concern of persistent hypotension, initially vasopressor were ordered but never started.  Blood pressure improved with IV fluid only.  Patient met sepsis criteria with tachycardia, tachypnea, leukocytosis, lactic acidosis, AKI, transaminitis and hypotension.  Septic shock ruled out as he never required pressors.  Most likely secondary to infection at ventral hernia site, and COVID-19 pneumonia.  General surgery was also consulted.  CT abdomen with no evidence of bowel obstruction.  See the full report.  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  General surgery is recommending conservative management with antibiotics at this time.  Chest x-ray with bilateral lung densities, right greater than left,  suggestive of bilateral pneumonia.  Patient was started on empiric antibiotics with cefepime, Flagyl and vancomycin per sepsis protocol.  Patient was very agitated overnight, keeps repeating that I am dying.  Received Haldol overnight and started on low-dose Precedex infusion by PCCM in the morning.  Patient is high risk for deterioration and death.  Subjective: Patient was seen and examined today.  Appears somnolent.  He was very agitated overnight requiring Haldol.  Now started on low-dose Precedex by PCCM. Son at bedside.  Assessment & Plan:   Principal Problem:   Acute respiratory disease due to COVID-19 virus Active Problems:   Essential hypertension   Hyperlipidemia   Rheumatoid arthritis ([place])   History of non-Hodgkin's lymphoma   CAD (coronary artery disease)   Chronic combined systolic and diastolic CHF (congestive heart failure) (HCC)   Acute respiratory failure with hypoxia (HCC)   Severe sepsis (HCC)   Ventral hernia   Abdominal wall cellulitis   Abnormal LFTs   Acute metabolic encephalopathy   CKD (chronic kidney disease), stage IIIa  Acute respiratory failure with hypoxia secondary to COVID-19 pneumonia.  Family is reluctant for remdesivir due to his liver and kidney abnormalities. Currently requiring higher level of oxygen.  Patient is DNR and DNI.  Elevated inflammatory markers. D-dimer above 14, lower extremity venous Doppler was negative for DVT.  VQ scan ordered. -Continue with Solu-Medrol -Continue with supplemental oxygen-try weaning if tolerated -Continue with supportive care  Severe sepsis secondary to abdominal wall cellulitis and COVID-19 pneumonia.  Patient met sepsis criteria with leukocytosis, tachycardia and tachypnea with endorgan damage causing lactic acidosis, AKI and transaminitis.  Preliminary blood cultures negative. -Continue with empiric antibiotics, currently on  cefepime, Flagyl and vancomycin. -Follow-up blood  cultures  Agitation/delirium.  Patient requiring multiple doses of Haldol overnight. -Started on low-dose Precedex by PCCM  Essential hypertension.  Blood pressure currently within goal.  He was hypotensive before. -Keep holding home antihypertensives -Monitor blood pressure  Hyperlipidemia. -Holding home dose of Lipitor due to transaminitis  Rheumatoid arthritis. -Holding home sulfasalazine  History of non-Hodgkin's lymphoma --Follow-up oncology   CAD (coronary artery disease) -Hold Lipitor due to abnormal liver function and AMS, also hold ASA until mental status improves  AKI with CKD stage IIIa.  Some improvement in creatinine with IV fluid.,  Currently at 1.28 with baseline of 1.1. -Monitor renal function -Avoid nephrotoxins  Chronic combined diastolic and systolic CHF: 2D echo on 05/28/4654 showed EF of 40-45% with grade 1 diastolic dysfunction.   -PCCM started on IV Lasix 40 mg daily -Daily BMP and weight -Strict intake and output   Objective: Vitals:   03/10/21 0900 03/10/21 1000 03/10/21 1100 03/10/21 1200  BP: (!) 98/53 127/70 99/62 112/61  Pulse:  76 67 67  Resp: 19 (!) 35 19 19  Temp:    98.2 F (36.8 C)  TempSrc:    Axillary  SpO2:  94% 98% 97%  Weight:      Height:        Intake/Output Summary (Last 24 hours) at 03/10/2021 1324 Last data filed at 03/10/2021 1200 Gross per 24 hour  Intake 1799.85 ml  Output 2560 ml  Net -760.15 ml   Filed Weights   02/24/2021 1104 03/15/2021 1611  Weight: 59 kg 58.3 kg    Examination:  General exam: Frail and lethargic elderly man, appears somnolent. Respiratory system: Bilateral crackles, respiratory effort normal. Cardiovascular system: S1 & S2 heard, RRR.  Gastrointestinal system: Soft, nontender, nondistended, bowel sounds positive. Central nervous system: Lethargic, not following much commands. Extremities: No edema, no cyanosis, pulses intact and symmetrical. Skin: Ventral hernia with some edema, erythema  and tenderness. Psychiatry: Judgement and insight appear impaired   DVT prophylaxis: Lovenox Code Status: DNR Family Communication: Discussed with son at bedside Disposition Plan:  Status is: Inpatient  Remains inpatient appropriate because: Severity of illness   Level of care: Stepdown  All the records are reviewed and case discussed with Care Management/Social Worker. Management plans discussed with the patient, nursing and they are in agreement.  Consultants:  General surgery PCCM  Procedures:  Antimicrobials:  Cefepime Vancomycin Flagyl  Data Reviewed: I have personally reviewed following labs and imaging studies  CBC: Recent Labs  Lab 02/28/2021 1126 03/10/21 0752  WBC 17.1* 13.2*  NEUTROABS 15.4* 11.4*  HGB 13.2 11.6*  HCT 39.3 35.2*  MCV 92.9 93.6  PLT 213 812   Basic Metabolic Panel: Recent Labs  Lab 03/01/2021 1126 03/10/21 0752  NA 135 141  K 3.5 3.6  CL 96* 103  CO2 24 21*  GLUCOSE 92 134*  BUN 38* 38*  CREATININE 1.43* 1.28*  CALCIUM 8.5* 7.6*  MG  --  2.1  PHOS  --  5.1*   GFR: Estimated Creatinine Clearance: 41.8 mL/min (A) (by C-G formula based on SCr of 1.28 mg/dL (H)). Liver Function Tests: Recent Labs  Lab 03/02/2021 1126 03/10/21 0752  AST 332* 196*  ALT 72* 66*  ALKPHOS 82 68  BILITOT 1.3* 1.5*  PROT 6.6 5.8*  ALBUMIN 3.0* 2.4*   No results for input(s): LIPASE, AMYLASE in the last 168 hours. Recent Labs  Lab 02/25/2021 1824  AMMONIA 20   Coagulation Profile: Recent Labs  Lab 03/05/2021 1126  INR 1.0   Cardiac Enzymes: No results for input(s): CKTOTAL, CKMB, CKMBINDEX, TROPONINI in the last 168 hours. BNP (last 3 results) No results for input(s): PROBNP in the last 8760 hours. HbA1C: No results for input(s): HGBA1C in the last 72 hours. CBG: Recent Labs  Lab 02/26/2021 1604  GLUCAP 86   Lipid Profile: No results for input(s): CHOL, HDL, LDLCALC, TRIG, CHOLHDL, LDLDIRECT in the last 72 hours. Thyroid Function  Tests: No results for input(s): TSH, T4TOTAL, FREET4, T3FREE, THYROIDAB in the last 72 hours. Anemia Panel: Recent Labs    03/11/2021 1612 03/10/21 0752  FERRITIN 396* 395*   Sepsis Labs: Recent Labs  Lab 02/28/2021 1126 02/24/2021 1533 03/22/2021 1612 03/10/21 1041  PROCALCITON  --   --  5.97  --   LATICACIDVEN 3.1* 2.3*  --  1.6    Recent Results (from the past 240 hour(s))  Blood Culture (routine x 2)     Status: None (Preliminary result)   Collection Time: 03/20/2021 11:26 AM   Specimen: BLOOD RIGHT HAND  Result Value Ref Range Status   Specimen Description BLOOD RIGHT HAND  Final   Special Requests   Final    BOTTLES DRAWN AEROBIC AND ANAEROBIC Blood Culture results may not be optimal due to an inadequate volume of blood received in culture bottles   Culture   Final    NO GROWTH < 24 HOURS Performed at [facility], [address], [place], [place] [address]    Report Status PENDING  Incomplete  Resp Panel by RT-PCR (Flu A&B, Covid) Nasopharyngeal Swab     Status: Abnormal   Collection Time: 03/19/2021  4:07 PM   Specimen: Nasopharyngeal Swab; Nasopharyngeal(NP) swabs in vial transport medium  Result Value Ref Range Status   SARS Coronavirus 2 by RT PCR POSITIVE (A) NEGATIVE Final    Comment: (NOTE) SARS-CoV-2 target nucleic acids are DETECTED.  The SARS-CoV-2 RNA is generally detectable in upper respiratory specimens during the acute phase of infection. Positive results are indicative of the presence of the identified virus, but do not rule out bacterial infection or co-infection with other pathogens not detected by the test. Clinical correlation with patient history and other diagnostic information is necessary to determine patient infection status. The expected result is Negative.  Fact Sheet for Patients: EntrepreneurPulse.com.au  Fact Sheet for Healthcare Providers: IncredibleEmployment.be  This test is not yet approved  or cleared by the [place] FDA and  has been authorized for detection and/or diagnosis of SARS-CoV-2 by FDA under an Emergency Use Authorization (EUA).  This EUA will remain in effect (meaning this test can be used) for the duration of  the COVID-19 declaration under Section 564(b)(1) of the A ct, 21 U.S.C. section 360bbb-3(b)(1), unless the authorization is terminated or revoked sooner.     Influenza A by PCR NEGATIVE NEGATIVE Final   Influenza B by PCR NEGATIVE NEGATIVE Final    Comment: (NOTE) The Xpert Xpress SARS-CoV-2/FLU/RSV plus assay is intended as an aid in the diagnosis of influenza from Nasopharyngeal swab specimens and should not be used as a sole basis for treatment. Nasal washings and aspirates are unacceptable for Xpert Xpress SARS-CoV-2/FLU/RSV testing.  Fact Sheet for Patients: EntrepreneurPulse.com.au  Fact Sheet for Healthcare Providers: IncredibleEmployment.be  This test is not yet approved or cleared by the [place] FDA and has been authorized for detection and/or diagnosis of SARS-CoV-2 by FDA under an Emergency Use Authorization (EUA). This EUA will remain in effect (  meaning this test can be used) for the duration of the COVID-19 declaration under Section 564(b)(1) of the Act, 21 U.S.C. section 360bbb-3(b)(1), unless the authorization is terminated or revoked.  Performed at [facility], [address] [place], [place] [address]   Blood Culture (routine x 2)     Status: None (Preliminary result)   Collection Time: 03/05/2021  4:10 PM   Specimen: BLOOD  Result Value Ref Range Status   Specimen Description BLOOD [facility]  Final   Special Requests BOTTLES DRAWN AEROBIC AND ANAEROBIC BCAV  Final   Culture   Final    NO GROWTH < 24 HOURS Performed at [facility], [address], [place], [place] [address]    Report Status PENDING  Incomplete     Radiology Studies: CT HEAD WO CONTRAST  (5MM)  Result Date: 03/19/2021 CLINICAL DATA:  Mental status change EXAM: CT HEAD WITHOUT CONTRAST TECHNIQUE: Contiguous axial images were obtained from the base of the skull through the vertex without intravenous contrast. COMPARISON:  None. FINDINGS: Brain: Hypodensity in the right anterior frontal lobe (series 2, image 19), of indeterminate acuity but favored to be subacute to chronic. No evidence of hemorrhage, cerebral edema, mass, mass effect, or midline shift. No hydrocephalus or extra-axial fluid collection. Periventricular white matter changes, likely the sequela of chronic small vessel ischemic disease. Lacunar infarct in the left basal ganglia. Vascular: No hyperdense vessel. Skull: Normal. Negative for fracture or focal lesion. Sinuses/Orbits: No acute finding. Status post left lens replacement. Other: The mastoid air cells are well aerated. IMPRESSION: IMPRESSION Hypodensity in the right anterior frontal lobe, of indeterminate acuity but favored to be subacute to chronic. Electronically Signed   By: Merilyn Baba M.D.   On: 02/24/2021 22:29   CT ABDOMEN PELVIS W CONTRAST  Result Date: 03/05/2021 CLINICAL DATA:  Abdominal distension, being treated for open wound from hernia surgery concern for bowel obstruction. EXAM: CT ABDOMEN AND PELVIS WITH CONTRAST TECHNIQUE: Multidetector CT imaging of the abdomen and pelvis was performed using the standard protocol following bolus administration of intravenous contrast. CONTRAST:  18m OMNIPAQUE IOHEXOL 300 MG/ML  SOLN COMPARISON:  None. FINDINGS: Lower chest: Mosaic attenuation of the lung bases with geographic regions of ground-glass opacities and more nodular consolidative areas. Normal size heart. Three-vessel coronary artery disease. Hepatobiliary: No suspicious hepatic lesion. Gallbladder is distended without wall thickening or pericholecystic fluid. No biliary ductal dilation. Pancreas: No pancreatic ductal dilation or evidence of acute  inflammation. Diffuse pancreatic atrophy. Spleen: Within normal limits. Adrenals/Urinary Tract: Bilateral adrenal glands are unremarkable. No hydronephrosis. Left renal cysts measuring up to 3.2 cm. Urinary bladder is unremarkable for degree of distension. Stomach/Bowel: Large hiatal hernia. No pathologic dilation of large or small bowel. No evidence of acute bowel inflammation. Large fat and nonobstructed bowel containing ventral hernia with a 6.4 cm aperture width. Laxity of the ventral abdominal wall containing fat and bowel which appears to extend to the left paramedian cutaneous surface. Vascular/Lymphatic: Aortic and branch vessel atherosclerosis without abdominal aortic aneurysm. No pathologically enlarged abdominal or pelvic lymph nodes. Reproductive: Prostate is unremarkable. Other: No significant abdominopelvic free fluid. Musculoskeletal: Degenerative and postoperative changes of the spine. Similar appearance of the T9 and T11 vertebral compression deformities. Avascular necrosis of the left femoral head. IMPRESSION: 1. No evidence of bowel obstruction. 2. Large fat and nonobstructed bowel containing ventral hernia with a 6.4 cm aperture. 3. Laxity of the ventral abdominal wall with peritoneal fat and loops of nonobstructed bowel extending to  the cutaneous skin surface. 4. Mosaic attenuation of the lung bases with geographic regions of ground-glass opacities and more nodular consolidative areas likely reflects infectious or inflammatory process. 5. Large hiatal hernia. 6. Avascular necrosis of the left femoral head. 7. Similar appearance of the T9 and T11 vertebral compression deformities. 8.  Aortic Atherosclerosis (ICD10-I70.0). Electronically Signed   By: Dahlia Bailiff M.D.   On: 03/08/2021 13:46   US Venous Img Lower Bilateral (DVT)  Result Date: 03/10/2021 CLINICAL DATA:  75 year old male with lower extremity swelling. EXAM: BILATERAL LOWER EXTREMITY VENOUS DOPPLER ULTRASOUND TECHNIQUE:  Gray-scale sonography with graded compression, as well as color Doppler and duplex ultrasound were performed to evaluate the lower extremity deep venous systems from the level of the common femoral vein and including the common femoral, femoral, profunda femoral, popliteal and calf veins including the posterior tibial, peroneal and gastrocnemius veins when visible. The superficial great saphenous vein was also interrogated. Spectral Doppler was utilized to evaluate flow at rest and with distal augmentation maneuvers in the common femoral, femoral and popliteal veins. COMPARISON:  None. FINDINGS: RIGHT LOWER EXTREMITY Common Femoral Vein: No evidence of thrombus. Normal compressibility, respiratory phasicity and response to augmentation. Saphenofemoral Junction: No evidence of thrombus. Normal compressibility and flow on color Doppler imaging. Profunda Femoral Vein: No evidence of thrombus. Normal compressibility and flow on color Doppler imaging. Femoral Vein: No evidence of thrombus. Normal compressibility, respiratory phasicity and response to augmentation. Popliteal Vein: No evidence of thrombus. Normal compressibility, respiratory phasicity and response to augmentation. Calf Veins: Limited evaluation, however no evidence of thrombus. Other Findings:  None. LEFT LOWER EXTREMITY Common Femoral Vein: No evidence of thrombus. Normal compressibility, respiratory phasicity and response to augmentation. Saphenofemoral Junction: No evidence of thrombus. Normal compressibility and flow on color Doppler imaging. Profunda Femoral Vein: No evidence of thrombus. Normal compressibility and flow on color Doppler imaging. Femoral Vein: No evidence of thrombus. Normal compressibility, respiratory phasicity and response to augmentation. Popliteal Vein: No evidence of thrombus. Normal compressibility, respiratory phasicity and response to augmentation. Calf Veins: Limited evaluation, however no evidence of thrombus. Other  Findings:  None. IMPRESSION: No evidence of bilateral lower extremity deep vein thrombosis. Limited evaluation of the calf veins. Ruthann Cancer, MD [facility] Electronically Signed   By: Ruthann Cancer M.D.   On: 03/10/2021 09:23   DG Chest Port 1 View  Result Date: 02/25/2021 CLINICAL DATA:  Questionable sepsis.  Evaluate for abnormality. EXAM: PORTABLE CHEST 1 VIEW COMPARISON:  09/06/2020 FINDINGS: Patchy densities throughout the mid and lower right chest. Patchy densities in the left lower chest. These lung densities are new. Surgical hardware in the lower cervical spine. Heart size is grossly stable. IMPRESSION: Bilateral lung densities, right side greater than left. Findings are suggestive for bilateral pneumonia. Electronically Signed   By: Markus Daft M.D.   On: 03/05/2021 11:42    Scheduled Meds:  Chlorhexidine Gluconate Cloth  6 each Topical Daily   enoxaparin (LOVENOX) injection  40 mg Subcutaneous Q24H   furosemide  40 mg Intravenous Daily   ipratropium-albuterol  3 mL Nebulization Q6H   methylPREDNISolone (SOLU-MEDROL) injection  40 mg Intravenous Q12H   Continuous Infusions:  sodium chloride 250 mL (03/18/2021 2243)   ceFEPime (MAXIPIME) IV 2 g (03/10/21 1037)   dexmedetomidine (PRECEDEX) IV infusion 1.2 mcg/kg/hr (03/10/21 0851)   metronidazole 500 mg (03/10/21 1100)   norepinephrine (LEVOPHED) Adult infusion Stopped (03/06/2021 2200)   vancomycin 1,000 mg (03/10/21 1308)  LOS: 1 day   Time spent: 45 minutes. More than 50% of the time was spent in counseling/coordination of care  Lorella Nimrod, MD [facility]  If 7PM-7AM, please contact night-coverage [email or web address]  03/10/2021, 1:24 PM   This record has been created using Systems analyst. Errors have been sought and corrected,but may not always be located. Such creation errors do not reflect on the standard of care.

## 2021-03-10 NOTE — Progress Notes (Signed)
Pharmacy Antibiotic Note  Travis Haynes is a 75 y.o. male admitted on 03/22/2021 with abdominal wall cellulitis and sepsis.  Pharmacy has been consulted for vancomycin and cefepime dosing.  Plan: Continue cefepime 2 g q12H   Pt received vancomycin 1250 mg x 1 loading dose. Vancomycin dose adjusted from 750 mg q24H to 1000 mg q24H due to improvement in Scr. Predicted AUC of 528. Goal AUC 400-550. Obtain vancomycin level after 4th or 5th dose. Scr 1.28.    Height: 5\' 8"  (172.7 cm) Weight: 58.3 kg (128 lb 8.5 oz) IBW/kg (Calculated) : 68.4  Temp (24hrs), Avg:97.8 F (36.6 C), Min:97.6 F (36.4 C), Max:98 F (36.7 C)  Recent Labs  Lab 03/05/2021 1126 02/22/2021 1533 03/10/21 0752 03/10/21 1041  WBC 17.1*  --  13.2*  --   CREATININE 1.43*  --  1.28*  --   LATICACIDVEN 3.1* 2.3*  --  1.6     Estimated Creatinine Clearance: 41.8 mL/min (A) (by C-G formula based on SCr of 1.28 mg/dL (H)).    Allergies  Allergen Reactions   Ativan [Lorazepam] Other (See Comments)    Pt states medication made him hyper   Gold Au 198 [Gold] Other (See Comments)    Pt states platelet count was low. This was in 51.    Antimicrobials this admission: 12/16 cefepime >>  12/16 metronidazole >>  12/16 vancomycin >>   Microbiology results: None  Thank you for allowing pharmacy to be a part of this patients care.  Oswald Hillock 03/10/2021 11:28 AM

## 2021-03-10 NOTE — Progress Notes (Signed)
CRITICAL CARE PROGRESS NOTE    Name: Travis Haynes MRN: [id number] DOB: [birth year]     LOS: 1   SUBJECTIVE FINDINGS & SIGNIFICANT EVENTS    Patient description:    This is a 75yo M with hx of HTN, dyslipidemia RHA, CKD3, CAD, lymphoma, CHF EF40%, GERD, who came in with DOE/SOB at rest with abd pain.  Family reported AMS x 3d, they denied dementia at baseline.  He does have recent medical acute issue of abdominal wall cellulitis.He has PCR + on 03/08/21 with cycle threshold value 17.  He is partial code with OK to intubate NO cpr advanced directive. PCCM consultation for acute hypoxemic respiratory failure due to [place] with concomitant abd wall cellulitis.   I met with son and granddaughter who is RN here and we reviewed findings and medical plan.   03/10/21- patient required haldol overnight.  Family declined antiviral therapy for COVID due to renal/hepatic impairment. His creatining and LFTs are better this am and he is normoxic on HFNC. Family at bedside this morning. He had CT head done with old CVA noted, will have neurology evaluation after COVID recovery.   Lines/tubes :   Microbiology/Sepsis markers: Results for orders placed or performed during the hospital encounter of 03/02/2021  Blood Culture (routine x 2)     Status: None (Preliminary result)   Collection Time: 03/22/2021 11:26 AM   Specimen: BLOOD RIGHT HAND  Result Value Ref Range Status   Specimen Description BLOOD RIGHT HAND  Final   Special Requests   Final    BOTTLES DRAWN AEROBIC AND ANAEROBIC Blood Culture results may not be optimal due to an inadequate volume of blood received in culture bottles   Culture   Final    NO GROWTH < 24 HOURS Performed at [facility], [address] [place], [place] [address]    Report Status  PENDING  Incomplete  Resp Panel by RT-PCR (Flu A&B, Covid) Nasopharyngeal Swab     Status: Abnormal   Collection Time: 02/27/2021  4:07 PM   Specimen: Nasopharyngeal Swab; Nasopharyngeal(NP) swabs in vial transport medium  Result Value Ref Range Status   SARS Coronavirus 2 by RT PCR POSITIVE (A) NEGATIVE Final    Comment: (NOTE) SARS-CoV-2 target nucleic acids are DETECTED.  The SARS-CoV-2 RNA is generally detectable in upper respiratory specimens during the acute phase of infection. Positive results are indicative of the presence of the identified virus, but do not rule out bacterial infection or co-infection with other pathogens not detected by the test. Clinical correlation with patient history and other diagnostic information is necessary to determine patient infection status. The expected result is Negative.  Fact Sheet for Patients: EntrepreneurPulse.com.au  Fact Sheet for Healthcare Providers: IncredibleEmployment.be  This test is not yet approved or cleared by the [place] FDA and  has been authorized for detection and/or diagnosis of SARS-CoV-2 by FDA under an Emergency Use Authorization (EUA).  This EUA will remain in effect (meaning this test can be used) for the duration of  the COVID-19 declaration under Section 564(b)(1) of the A ct, 21 U.S.C. section 360bbb-3(b)(1), unless the authorization is terminated or revoked sooner.     Influenza A by PCR NEGATIVE NEGATIVE Final   Influenza B by PCR NEGATIVE NEGATIVE Final    Comment: (NOTE) The Xpert Xpress SARS-CoV-2/FLU/RSV plus assay is intended as an aid in the diagnosis of influenza from Nasopharyngeal swab specimens and should not be used as a sole basis for treatment. Nasal  washings and aspirates are unacceptable for Xpert Xpress SARS-CoV-2/FLU/RSV testing.  Fact Sheet for Patients: EntrepreneurPulse.com.au  Fact Sheet for Healthcare  Providers: IncredibleEmployment.be  This test is not yet approved or cleared by the [place] FDA and has been authorized for detection and/or diagnosis of SARS-CoV-2 by FDA under an Emergency Use Authorization (EUA). This EUA will remain in effect (meaning this test can be used) for the duration of the COVID-19 declaration under Section 564(b)(1) of the Act, 21 U.S.C. section 360bbb-3(b)(1), unless the authorization is terminated or revoked.  Performed at [facility], [address] [place], [place] [address]   Blood Culture (routine x 2)     Status: None (Preliminary result)   Collection Time: 02/27/2021  4:10 PM   Specimen: BLOOD  Result Value Ref Range Status   Specimen Description BLOOD [facility]  Final   Special Requests BOTTLES DRAWN AEROBIC AND ANAEROBIC BCAV  Final   Culture   Final    NO GROWTH < 24 HOURS Performed at [facility], [address], [place], [place] [address]    Report Status PENDING  Incomplete    Anti-infectives:  Anti-infectives (From admission, onward)    Start     Dose/Rate Route Frequency Ordered Stop   03/10/21 1200  vancomycin (VANCOREADY) IVPB 750 mg/150 mL        750 mg 150 mL/hr over 60 Minutes Intravenous Every 24 hours 03/01/2021 1439     03/10/21 1000  remdesivir 100 mg in sodium chloride 0.9 % 100 mL IVPB       See Hyperspace for full Linked Orders Report.   100 mg 200 mL/hr over 30 Minutes Intravenous Daily 02/25/2021 1529 03/14/21 0959   03/10/21 0000  ceFEPIme (MAXIPIME) 2 g in sodium chloride 0.9 % 100 mL IVPB        2 g 200 mL/hr over 30 Minutes Intravenous Every 12 hours 03/04/2021 1439     03/18/2021 2200  metroNIDAZOLE (FLAGYL) IVPB 500 mg        500 mg 100 mL/hr over 60 Minutes Intravenous Every 12 hours 03/18/2021 1425     03/08/2021 1700  remdesivir 200 mg in sodium chloride 0.9% 250 mL IVPB       See Hyperspace for full Linked Orders Report.   200 mg 580 mL/hr over 30 Minutes Intravenous Once  03/10/2021 1529     02/23/2021 1200  vancomycin (VANCOREADY) IVPB 1250 mg/250 mL        1,250 mg 166.7 mL/hr over 90 Minutes Intravenous  Once 03/04/2021 1125 03/04/2021 1457   02/22/2021 1130  ceFEPIme (MAXIPIME) 2 g in sodium chloride 0.9 % 100 mL IVPB        2 g 200 mL/hr over 30 Minutes Intravenous  Once 03/01/2021 1122 02/23/2021 1246   03/22/2021 1130  metroNIDAZOLE (FLAGYL) IVPB 500 mg        500 mg 100 mL/hr over 60 Minutes Intravenous  Once 03/01/2021 1122 02/24/2021 1246        Consults: Treatment Team:  Vicki Mallet, MD     PAST MEDICAL HISTORY   Past Medical History:  Diagnosis Date   Arthritis    rheumatoid arthritis, takes prednisone for this   Barrett's esophagus    Cancer ([place]) 1999   non hodgkins lymphoma   Chronic back pain    Chronic pain syndrome    Claustrophobia    Coronary artery disease    GERD (gastroesophageal reflux disease)    barretts esophagus  that requires dilation every couple years   Hemorrhoids    Hernia of abdominal cavity    History of hiatal hernia    Hyperlipidemia    Hypertension    Lumbago    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   Wears dentures    Weight loss 11/2017   30 lb weight loss over 3 years     SURGICAL HISTORY   Past Surgical History:  Procedure Laterality Date   Walker   2 fusions then 1 surgery to fix the other two (lower)..plates and screws   CATARACT EXTRACTION W/PHACO Left 12/31/2018   Procedure: CATARACT EXTRACTION PHACO AND INTRAOCULAR LENS PLACEMENT ([place]) LEFT VISION BLUE;  Surgeon: Marchia Meiers, MD;  Location: [facility] ORS;  Service: Ophthalmology;  Laterality: Left;  Lot #[id number] H [place]: 01:51.7 CDE: 18.59   CYSTECTOMY     from buttocks   ESOPHAGEAL DILATION     multiple times   ESOPHAGOGASTRODUODENOSCOPY (EGD) WITH PROPOFOL N/A 12/05/2014   Procedure: ESOPHAGOGASTRODUODENOSCOPY (EGD) WITH PROPOFOL;  Surgeon: Hulen Luster, MD;  Location:  [facility];  Service: Gastroenterology;  Laterality: N/A;   EXCISION PARTIAL PHALANX Right 12/09/2017   Procedure: EXCISION PARTIAL PHALANX-GREAT TOE;  Surgeon: Sharlotte Alamo, DPM;  Location: [facility] ORS;  Service: Podiatry;  Laterality: Right;   FOOT SURGERY     intestine repair  1998   during biopsy his intestines were nicked requiring repair   MULTIPLE TOOTH EXTRACTIONS     NECK SURGERY  1996   fused 2 vertebrae in neck (plate plus screws)   RADIOLOGY WITH ANESTHESIA N/A 03/09/2019   Procedure: MRI WITH ANESTHESIA LUMBAR WITHOUT CONTRAST;  Surgeon: Radiologist, Medication, MD;  Location: [place];  Service: Radiology;  Laterality: N/A;   RADIOLOGY WITH ANESTHESIA N/A 09/23/2019   Procedure: MRI WITH ANESTHESIA; LUMBAR SPINE WITHOUT CONTRAST;  Surgeon: Radiologist, Medication, MD;  Location: [place];  Service: Radiology;  Laterality: N/A;   Reduction masseter muscle/bone extraoral       FAMILY HISTORY   Family History  Problem Relation Age of Onset   Heart disease Mother        heart attack   Arthritis Mother    Arthritis Father      SOCIAL HISTORY   Social History   Tobacco Use   Smoking status: Former    Types: Cigarettes    Quit date: 1998    Years since quitting: 24.9   Smokeless tobacco: Never  Vaping Use   Vaping Use: Never used  Substance Use Topics   Alcohol use: No    Comment: stopped d/t cluster headaches   Drug use: No     MEDICATIONS   Current Medication:  Current Facility-Administered Medications:    0.9 %  sodium chloride infusion, 250 mL, Intravenous, Continuous, Darel Hong D, NP, Last Rate: 10 mL/hr at 03/06/2021 2243, 250 mL at 03/21/2021 2243   albuterol (PROVENTIL) (2.5 MG/3ML) 0.083% nebulizer solution 2.5 mg, 2.5 mg, Nebulization, Q4H PRN, Ivor Costa, MD   ceFEPIme (MAXIPIME) 2 g in sodium chloride 0.9 % 100 mL IVPB, 2 g, Intravenous, Q12H, Rauer, Samantha O, RPH, Last Rate: 200 mL/hr at 03/10/21 0124, 2 g at 03/10/21 0124   Chlorhexidine  Gluconate Cloth 2 % PADS 6 each, 6 each, Topical, Daily, Charlean Carneal, MD   dexmedetomidine (PRECEDEX) 400 MCG/100ML (4 mcg/mL) infusion, 0.4-1.2 mcg/kg/hr, Intravenous, Titrated, Tearra Ouk, MD, Last Rate: 17.49 mL/hr at 03/10/21 0242, 1.2 mcg/kg/hr at 03/10/21  65   dextromethorphan-guaiFENesin (MUCINEX DM) 30-600 MG per 12 hr tablet 1 tablet, 1 tablet, Oral, BID PRN, Ivor Costa, MD   diphenhydrAMINE (BENADRYL) injection 12.5 mg, 12.5 mg, Intravenous, Q8H PRN, Ivor Costa, MD   enoxaparin (LOVENOX) injection 40 mg, 40 mg, Subcutaneous, Q24H, Ivor Costa, MD, 40 mg at 02/24/2021 1728   furosemide (LASIX) injection 40 mg, 40 mg, Intravenous, Daily, Ottie Glazier, MD, 40 mg at 03/04/2021 1728   haloperidol lactate (HALDOL) injection 2 mg, 2 mg, Intravenous, Q6H PRN, Darel Hong D, NP, 2 mg at 03/10/21 0324   hydrALAZINE (APRESOLINE) injection 5 mg, 5 mg, Intravenous, Q2H PRN, Ivor Costa, MD   HYDROmorphone (DILAUDID) injection 0.5 mg, 0.5 mg, Intravenous, Q4H PRN, Darel Hong D, NP, 0.5 mg at 03/10/21 0348   ipratropium-albuterol (DUONEB) 0.5-2.5 (3) MG/3ML nebulizer solution 3 mL, 3 mL, Nebulization, Q6H, Darel Hong D, NP, 3 mL at 03/10/21 0744   methylPREDNISolone sodium succinate (SOLU-MEDROL) 40 mg/mL injection 40 mg, 40 mg, Intravenous, Q12H, Ivor Costa, MD, 40 mg at 03/10/21 0242   metroNIDAZOLE (FLAGYL) IVPB 500 mg, 500 mg, Intravenous, Q12H, Ivor Costa, MD, Last Rate: 100 mL/hr at 03/06/2021 2244, 500 mg at 03/01/2021 2244   norepinephrine (LEVOPHED) 69m in 2536m(0.016 mg/mL) premix infusion, 2-10 mcg/min, Intravenous, Titrated, KeDarel Hong, NP, Stopping Infusion hung by another clincian at 03/20/2021 2200   remdesivir 200 mg in sodium chloride 0.9% 250 mL IVPB, 200 mg, Intravenous, Once **FOLLOWED BY** remdesivir 100 mg in sodium chloride 0.9 % 100 mL IVPB, 100 mg, Intravenous, Daily, Rauer, Samantha O, RPH   vancomycin (VANCOREADY) IVPB 750 mg/150 mL, 750 mg, Intravenous,  Q24H, Rauer, Samantha O, RPH    ALLERGIES   Ativan [lorazepam] and Gold au 198 [gold]    REVIEW OF SYSTEMS     10 point ROS done and patient is confused with encephalopathy  PHYSICAL EXAMINATION   Vital Signs: Temp:  [97.6 F (36.4 C)-98 F (36.7 C)] 98 F (36.7 C) (12/17 0400) Pulse Rate:  [77-113] 77 (12/17 0400) Resp:  [18-37] 20 (12/17 0400) BP: (72-160)/(58-141) 105/60 (12/17 0400) SpO2:  [87 %-100 %] 90 % (12/17 0745) FiO2 (%):  [100 %] 100 % (12/17 0745) Weight:  [58.3 kg-59 kg] 58.3 kg (12/16 1611)  GENERAL:Age appropriate HEAD: Normocephalic, atraumatic.  EYES: Pupils equal, round, reactive to light.  No scleral icterus.  MOUTH: Moist mucosal membrane. NECK: Supple. No thyromegaly. No nodules. No JVD.  PULMONARY: Rhonchi bilaterally  CARDIOVASCULAR: S1 and S2. Regular rate and rhythm. No murmurs, rubs, or gallops.  GASTROINTESTINAL: Soft +tender, +distended. No masses. Negative bowel sounds. No hepatosplenomegaly.  MUSCULOSKELETAL: No swelling, clubbing, or edema.  NEUROLOGIC: Mild distress due to acute illness GCS9 SKIN:intact,warm,dry   PERTINENT DATA     Infusions:  sodium chloride 250 mL (03/08/2021 2243)   ceFEPime (MAXIPIME) IV 2 g (03/10/21 0124)   dexmedetomidine (PRECEDEX) IV infusion 1.2 mcg/kg/hr (03/10/21 0242)   metronidazole 500 mg (02/23/2021 2244)   norepinephrine (LEVOPHED) Adult infusion Stopped (03/18/2021 2200)   remdesivir 200 mg in sodium chloride 0.9% 250 mL IVPB     Followed by   remdesivir 100 mg in NS 100 mL     vancomycin     Scheduled Medications:  Chlorhexidine Gluconate Cloth  6 each Topical Daily   enoxaparin (LOVENOX) injection  40 mg Subcutaneous Q24H   furosemide  40 mg Intravenous Daily   ipratropium-albuterol  3 mL Nebulization Q6H   methylPREDNISolone (SOLU-MEDROL) injection  40 mg Intravenous Q12H  PRN Medications: albuterol, dextromethorphan-guaiFENesin, diphenhydrAMINE, haloperidol lactate, hydrALAZINE,  HYDROmorphone (DILAUDID) injection Hemodynamic parameters:   Intake/Output: 12/16 0701 - 12/17 0700 In: 1799.9 [I.V.:1251.9; IV Piggyback:548] Out: 1100 [Urine:1100]  Ventilator  Settings: FiO2 (%):  [100 %] 100 %   LAB RESULTS:  Basic Metabolic Panel: Recent Labs  Lab 03/17/2021 1126  NA 135  K 3.5  CL 96*  CO2 24  GLUCOSE 92  BUN 38*  CREATININE 1.43*  CALCIUM 8.5*    Liver Function Tests: Recent Labs  Lab 03/14/2021 1126  AST 332*  ALT 72*  ALKPHOS 82  BILITOT 1.3*  PROT 6.6  ALBUMIN 3.0*    No results for input(s): LIPASE, AMYLASE in the last 168 hours. Recent Labs  Lab 02/22/2021 1824  AMMONIA 20   CBC: Recent Labs  Lab 03/11/2021 1126  WBC 17.1*  NEUTROABS 15.4*  HGB 13.2  HCT 39.3  MCV 92.9  PLT 213    Cardiac Enzymes: No results for input(s): CKTOTAL, CKMB, CKMBINDEX, TROPONINI in the last 168 hours. BNP: Invalid input(s): POCBNP CBG: Recent Labs  Lab 03/10/2021 1604  GLUCAP 86       IMAGING RESULTS:  Imaging: CT HEAD WO CONTRAST (5MM)  Result Date: 03/05/2021 CLINICAL DATA:  Mental status change EXAM: CT HEAD WITHOUT CONTRAST TECHNIQUE: Contiguous axial images were obtained from the base of the skull through the vertex without intravenous contrast. COMPARISON:  None. FINDINGS: Brain: Hypodensity in the right anterior frontal lobe (series 2, image 19), of indeterminate acuity but favored to be subacute to chronic. No evidence of hemorrhage, cerebral edema, mass, mass effect, or midline shift. No hydrocephalus or extra-axial fluid collection. Periventricular white matter changes, likely the sequela of chronic small vessel ischemic disease. Lacunar infarct in the left basal ganglia. Vascular: No hyperdense vessel. Skull: Normal. Negative for fracture or focal lesion. Sinuses/Orbits: No acute finding. Status post left lens replacement. Other: The mastoid air cells are well aerated. IMPRESSION: IMPRESSION Hypodensity in the right anterior frontal  lobe, of indeterminate acuity but favored to be subacute to chronic. Electronically Signed   By: Merilyn Baba M.D.   On: 03/08/2021 22:29   CT ABDOMEN PELVIS W CONTRAST  Result Date: 03/05/2021 CLINICAL DATA:  Abdominal distension, being treated for open wound from hernia surgery concern for bowel obstruction. EXAM: CT ABDOMEN AND PELVIS WITH CONTRAST TECHNIQUE: Multidetector CT imaging of the abdomen and pelvis was performed using the standard protocol following bolus administration of intravenous contrast. CONTRAST:  162m OMNIPAQUE IOHEXOL 300 MG/ML  SOLN COMPARISON:  None. FINDINGS: Lower chest: Mosaic attenuation of the lung bases with geographic regions of ground-glass opacities and more nodular consolidative areas. Normal size heart. Three-vessel coronary artery disease. Hepatobiliary: No suspicious hepatic lesion. Gallbladder is distended without wall thickening or pericholecystic fluid. No biliary ductal dilation. Pancreas: No pancreatic ductal dilation or evidence of acute inflammation. Diffuse pancreatic atrophy. Spleen: Within normal limits. Adrenals/Urinary Tract: Bilateral adrenal glands are unremarkable. No hydronephrosis. Left renal cysts measuring up to 3.2 cm. Urinary bladder is unremarkable for degree of distension. Stomach/Bowel: Large hiatal hernia. No pathologic dilation of large or small bowel. No evidence of acute bowel inflammation. Large fat and nonobstructed bowel containing ventral hernia with a 6.4 cm aperture width. Laxity of the ventral abdominal wall containing fat and bowel which appears to extend to the left paramedian cutaneous surface. Vascular/Lymphatic: Aortic and branch vessel atherosclerosis without abdominal aortic aneurysm. No pathologically enlarged abdominal or pelvic lymph nodes. Reproductive: Prostate is unremarkable. Other: No significant abdominopelvic free  fluid. Musculoskeletal: Degenerative and postoperative changes of the spine. Similar appearance of the T9  and T11 vertebral compression deformities. Avascular necrosis of the left femoral head. IMPRESSION: 1. No evidence of bowel obstruction. 2. Large fat and nonobstructed bowel containing ventral hernia with a 6.4 cm aperture. 3. Laxity of the ventral abdominal wall with peritoneal fat and loops of nonobstructed bowel extending to the cutaneous skin surface. 4. Mosaic attenuation of the lung bases with geographic regions of ground-glass opacities and more nodular consolidative areas likely reflects infectious or inflammatory process. 5. Large hiatal hernia. 6. Avascular necrosis of the left femoral head. 7. Similar appearance of the T9 and T11 vertebral compression deformities. 8.  Aortic Atherosclerosis (ICD10-I70.0). Electronically Signed   By: Dahlia Bailiff M.D.   On: 03/14/2021 13:46   DG Chest Port 1 View  Result Date: 03/08/2021 CLINICAL DATA:  Questionable sepsis.  Evaluate for abnormality. EXAM: PORTABLE CHEST 1 VIEW COMPARISON:  09/06/2020 FINDINGS: Patchy densities throughout the mid and lower right chest. Patchy densities in the left lower chest. These lung densities are new. Surgical hardware in the lower cervical spine. Heart size is grossly stable. IMPRESSION: Bilateral lung densities, right side greater than left. Findings are suggestive for bilateral pneumonia. Electronically Signed   By: Markus Daft M.D.   On: 02/22/2021 11:42   @PROBHOSP @ CT HEAD WO CONTRAST (5MM)  Result Date: 03/04/2021 CLINICAL DATA:  Mental status change EXAM: CT HEAD WITHOUT CONTRAST TECHNIQUE: Contiguous axial images were obtained from the base of the skull through the vertex without intravenous contrast. COMPARISON:  None. FINDINGS: Brain: Hypodensity in the right anterior frontal lobe (series 2, image 19), of indeterminate acuity but favored to be subacute to chronic. No evidence of hemorrhage, cerebral edema, mass, mass effect, or midline shift. No hydrocephalus or extra-axial fluid collection. Periventricular white  matter changes, likely the sequela of chronic small vessel ischemic disease. Lacunar infarct in the left basal ganglia. Vascular: No hyperdense vessel. Skull: Normal. Negative for fracture or focal lesion. Sinuses/Orbits: No acute finding. Status post left lens replacement. Other: The mastoid air cells are well aerated. IMPRESSION: IMPRESSION Hypodensity in the right anterior frontal lobe, of indeterminate acuity but favored to be subacute to chronic. Electronically Signed   By: Merilyn Baba M.D.   On: 03/07/2021 22:29   CT ABDOMEN PELVIS W CONTRAST  Result Date: 03/20/2021 CLINICAL DATA:  Abdominal distension, being treated for open wound from hernia surgery concern for bowel obstruction. EXAM: CT ABDOMEN AND PELVIS WITH CONTRAST TECHNIQUE: Multidetector CT imaging of the abdomen and pelvis was performed using the standard protocol following bolus administration of intravenous contrast. CONTRAST:  136m OMNIPAQUE IOHEXOL 300 MG/ML  SOLN COMPARISON:  None. FINDINGS: Lower chest: Mosaic attenuation of the lung bases with geographic regions of ground-glass opacities and more nodular consolidative areas. Normal size heart. Three-vessel coronary artery disease. Hepatobiliary: No suspicious hepatic lesion. Gallbladder is distended without wall thickening or pericholecystic fluid. No biliary ductal dilation. Pancreas: No pancreatic ductal dilation or evidence of acute inflammation. Diffuse pancreatic atrophy. Spleen: Within normal limits. Adrenals/Urinary Tract: Bilateral adrenal glands are unremarkable. No hydronephrosis. Left renal cysts measuring up to 3.2 cm. Urinary bladder is unremarkable for degree of distension. Stomach/Bowel: Large hiatal hernia. No pathologic dilation of large or small bowel. No evidence of acute bowel inflammation. Large fat and nonobstructed bowel containing ventral hernia with a 6.4 cm aperture width. Laxity of the ventral abdominal wall containing fat and bowel which appears to extend  to the left paramedian  cutaneous surface. Vascular/Lymphatic: Aortic and branch vessel atherosclerosis without abdominal aortic aneurysm. No pathologically enlarged abdominal or pelvic lymph nodes. Reproductive: Prostate is unremarkable. Other: No significant abdominopelvic free fluid. Musculoskeletal: Degenerative and postoperative changes of the spine. Similar appearance of the T9 and T11 vertebral compression deformities. Avascular necrosis of the left femoral head. IMPRESSION: 1. No evidence of bowel obstruction. 2. Large fat and nonobstructed bowel containing ventral hernia with a 6.4 cm aperture. 3. Laxity of the ventral abdominal wall with peritoneal fat and loops of nonobstructed bowel extending to the cutaneous skin surface. 4. Mosaic attenuation of the lung bases with geographic regions of ground-glass opacities and more nodular consolidative areas likely reflects infectious or inflammatory process. 5. Large hiatal hernia. 6. Avascular necrosis of the left femoral head. 7. Similar appearance of the T9 and T11 vertebral compression deformities. 8.  Aortic Atherosclerosis (ICD10-I70.0). Electronically Signed   By: Dahlia Bailiff M.D.   On: 03/08/2021 13:46   DG Chest Port 1 View  Result Date: 03/11/2021 CLINICAL DATA:  Questionable sepsis.  Evaluate for abnormality. EXAM: PORTABLE CHEST 1 VIEW COMPARISON:  09/06/2020 FINDINGS: Patchy densities throughout the mid and lower right chest. Patchy densities in the left lower chest. These lung densities are new. Surgical hardware in the lower cervical spine. Heart size is grossly stable. IMPRESSION: Bilateral lung densities, right side greater than left. Findings are suggestive for bilateral pneumonia. Electronically Signed   By: Markus Daft M.D.   On: 03/08/2021 11:42        ASSESSMENT AND PLAN    -Multidisciplinary rounds held today  Acute COVID19 pneumonia -Remdesevir antiviral - pharmacy protocol 5 d -vitamin C -zinc -steroids - currently  solumedrol 40 bid -Diuresis - Lasix 40 IV daily - monitor UOP - utilize external urinary catheter if possible -Self prone if patient can tolerate  -encourage to use IS and Acapella device for bronchopulmonary hygiene when able -d/c hepatotoxic medications while on remdesevir -supportive care with ICU telemetry monitoring -PT/OT when possible -procalcitonin, CRP and ferritin trending -on HFNC - reviewed with RT  Abdominal wall cellulitis -ventral hernia - surgery on case appreciate management and recommendations -CT abdomen- 1. No evidence of bowel obstruction. 2. Large fat and nonobstructed bowel containing ventral hernia with a 6.4 cm aperture. 3. Laxity of the ventral abdominal wall with peritoneal fat and loops of nonobstructed bowel extending to the cutaneous skin surface. ICU monitoring-    Altered mental status with encephalopathy  Due to COVID19    - there is aggitation with hyperactive delerium and confusion - have initiated low dose precedex.     Renal Failure-most likely due to ATN -follow chem 7 -follow UO -continue Foley Catheter-assess need daily   Avascular necrosis of left femoral head    - chronicity unknown    - will address post recovery from COVID    -elevated risk for BM emobli -    Septic shock- RULED OUT -use vasopressors to keep MAP>65 -follow ABG and LA -follow up cultures -emperic ABX -consider stress dose steroids   ID -continue IV abx as prescibed -follow up cultures   GI/Nutrition GI PROPHYLAXIS as indicated DIET-->TF's as tolerated Constipation protocol as indicated  ENDO - ICU hypoglycemic\Hyperglycemia protocol -check FSBS per protocol   ELECTROLYTES -follow labs as needed -replace as needed -pharmacy consultation   DVT/GI PRX ordered -SCDs  TRANSFUSIONS AS NEEDED MONITOR FSBS ASSESS the need for LABS as needed   Critical care provider statement:   Total critical care time: 33 minutes  Performed by:  Lanney Gins MD   Critical care time was exclusive of separately billable procedures and treating other patients.   Critical care was necessary to treat or prevent imminent or life-threatening deterioration.   Critical care was time spent personally by me on the following activities: development of treatment plan with patient and/or surrogate as well as nursing, discussions with consultants, evaluation of patient's response to treatment, examination of patient, obtaining history from patient or surrogate, ordering and performing treatments and interventions, ordering and review of laboratory studies, ordering and review of radiographic studies, pulse oximetry and re-evaluation of patient's condition.    Ottie Glazier, M.D.  Pulmonary & Critical Care Medicine        Ottie Glazier, M.D.  Division of [place]

## 2021-03-11 ENCOUNTER — Inpatient Hospital Stay: Payer: Medicare Other

## 2021-03-11 LAB — COMPREHENSIVE METABOLIC PANEL
ALT: 63 U/L — ABNORMAL HIGH (ref 0–44)
AST: 132 U/L — ABNORMAL HIGH (ref 15–41)
Albumin: 2.4 g/dL — ABNORMAL LOW (ref 3.5–5.0)
Alkaline Phosphatase: 66 U/L (ref 38–126)
Anion gap: 14 (ref 5–15)
BUN: 41 mg/dL — ABNORMAL HIGH (ref 8–23)
CO2: 23 mmol/L (ref 22–32)
Calcium: 7.7 mg/dL — ABNORMAL LOW (ref 8.9–10.3)
Chloride: 104 mmol/L (ref 98–111)
Creatinine, Ser: 1.26 mg/dL — ABNORMAL HIGH (ref 0.61–1.24)
GFR, Estimated: 60 mL/min — ABNORMAL LOW (ref >60)
Glucose, Bld: 119 mg/dL — ABNORMAL HIGH (ref 70–99)
Potassium: 3.4 mmol/L — ABNORMAL LOW (ref 3.5–5.1)
Sodium: 141 mmol/L (ref 135–145)
Total Bilirubin: 1.3 mg/dL — ABNORMAL HIGH (ref 0.3–1.2)
Total Protein: 5.7 g/dL — ABNORMAL LOW (ref 6.5–8.1)

## 2021-03-11 LAB — URINALYSIS, COMPLETE (UACMP) WITH MICROSCOPIC
Bacteria, UA: NONE SEEN
Bilirubin Urine: NEGATIVE
Glucose, UA: NEGATIVE mg/dL
Ketones, ur: 20 mg/dL — AB
Leukocytes,Ua: NEGATIVE
Nitrite: NEGATIVE
Protein, ur: 30 mg/dL — AB
Specific Gravity, Urine: >1.046 — ABNORMAL HIGH (ref 1.005–1.030)
pH: 6 (ref 5.0–8.0)

## 2021-03-11 LAB — CBC WITH DIFFERENTIAL/PLATELET
Abs Immature Granulocytes: 0.06 10*3/uL (ref 0.00–0.07)
Basophils Absolute: 0 10*3/uL (ref 0.0–0.1)
Basophils Relative: 0 %
Eosinophils Absolute: 0 10*3/uL (ref 0.0–0.5)
Eosinophils Relative: 0 %
HCT: 33.8 % — ABNORMAL LOW (ref 39.0–52.0)
Hemoglobin: 11.2 g/dL — ABNORMAL LOW (ref 13.0–17.0)
Immature Granulocytes: 1 %
Lymphocytes Relative: 1 %
Lymphs Abs: 0.1 10*3/uL — ABNORMAL LOW (ref 0.7–4.0)
MCH: 31 pg (ref 26.0–34.0)
MCHC: 33.1 g/dL (ref 30.0–36.0)
MCV: 93.6 fL (ref 80.0–100.0)
Monocytes Absolute: 0.7 10*3/uL (ref 0.1–1.0)
Monocytes Relative: 6 %
Neutro Abs: 9.6 10*3/uL — ABNORMAL HIGH (ref 1.7–7.7)
Neutrophils Relative %: 92 %
Platelets: 160 10*3/uL (ref 150–400)
RBC: 3.61 MIL/uL — ABNORMAL LOW (ref 4.22–5.81)
RDW: 14.4 % (ref 11.5–15.5)
WBC: 10.5 10*3/uL (ref 4.0–10.5)
nRBC: 0 % (ref 0.0–0.2)

## 2021-03-11 LAB — C-REACTIVE PROTEIN: CRP: 17.6 mg/dL — ABNORMAL HIGH (ref <1.0)

## 2021-03-11 LAB — LACTIC ACID, PLASMA
Lactic Acid, Venous: 4.9 mmol/L (ref 0.5–1.9)
Lactic Acid, Venous: 1.9 mmol/L (ref 0.5–1.9)

## 2021-03-11 LAB — FIBRINOGEN: Fibrinogen: 604 mg/dL — ABNORMAL HIGH (ref 210–475)

## 2021-03-11 LAB — MAGNESIUM: Magnesium: 2 mg/dL (ref 1.7–2.4)

## 2021-03-11 LAB — PHOSPHORUS: Phosphorus: 2.3 mg/dL — ABNORMAL LOW (ref 2.5–4.6)

## 2021-03-11 LAB — FERRITIN: Ferritin: 345 ng/mL — ABNORMAL HIGH (ref 24–336)

## 2021-03-11 LAB — D-DIMER, QUANTITATIVE: D-Dimer, Quant: 8.83 ug/mL-FEU — ABNORMAL HIGH (ref 0.00–0.50)

## 2021-03-11 MED ORDER — LACTATED RINGERS IV SOLN: INTRAVENOUS | Status: DC

## 2021-03-11 MED ORDER — HALOPERIDOL LACTATE 5 MG/ML IJ SOLN
5.0000 mg | Freq: Once | INTRAMUSCULAR | Status: AC
  Administered 2021-03-11: 13:00:00 5 mg via INTRAVENOUS

## 2021-03-11 MED ORDER — MORPHINE SULFATE (PF) 2 MG/ML IV SOLN
2.0000 mg | Freq: Once | INTRAVENOUS | Status: AC
  Administered 2021-03-11: 03:00:00 2 mg via INTRAVENOUS

## 2021-03-11 MED ORDER — MORPHINE SULFATE (PF) 2 MG/ML IV SOLN: 2.0000 mg | INTRAVENOUS | Status: DC | PRN

## 2021-03-11 MED ORDER — POLYVINYL ALCOHOL 1.4 % OP SOLN
2.0000 [drp] | OPHTHALMIC | Status: DC | PRN
  Administered 2021-03-12: 08:00:00 2 [drp] via OPHTHALMIC
  Filled 2021-03-11: qty 15

## 2021-03-11 MED ORDER — IOHEXOL 300 MG/ML  SOLN
100.0000 mL | Freq: Once | INTRAMUSCULAR | Status: AC | PRN
  Administered 2021-03-11: 02:00:00 100 mL via INTRAVENOUS

## 2021-03-11 MED ORDER — MORPHINE SULFATE (PF) 2 MG/ML IV SOLN
2.0000 mg | Freq: Once | INTRAVENOUS | Status: AC
  Administered 2021-03-11: 14:00:00 2 mg via INTRAVENOUS

## 2021-03-11 MED ORDER — MORPHINE SULFATE (PF) 2 MG/ML IV SOLN
2.0000 mg | INTRAVENOUS | Status: DC | PRN
  Administered 2021-03-11 (×2): 2 mg via INTRAVENOUS
  Filled 2021-03-11 (×2): qty 1

## 2021-03-11 MED ORDER — SENNOSIDES-DOCUSATE SODIUM 8.6-50 MG PO TABS: 1.0000 | ORAL_TABLET | Freq: Two times a day (BID) | ORAL | Status: DC

## 2021-03-11 MED ORDER — MORPHINE SULFATE (PF) 2 MG/ML IV SOLN
2.0000 mg | INTRAVENOUS | Status: DC | PRN
  Filled 2021-03-11: qty 1

## 2021-03-11 MED ORDER — HYDROMORPHONE HCL 1 MG/ML IJ SOLN
1.0000 mg | INTRAMUSCULAR | Status: DC | PRN
  Administered 2021-03-11 – 2021-03-12 (×7): 1 mg via INTRAVENOUS
  Filled 2021-03-11 (×7): qty 1

## 2021-03-11 MED ORDER — POTASSIUM CHLORIDE CRYS ER 20 MEQ PO TBCR: 20.0000 meq | EXTENDED_RELEASE_TABLET | Freq: Once | ORAL | Status: DC

## 2021-03-11 MED ORDER — HYDROMORPHONE HCL 1 MG/ML IJ SOLN
1.0000 mg | INTRAMUSCULAR | Status: DC | PRN
  Administered 2021-03-11: 20:00:00 1 mg via INTRAVENOUS
  Filled 2021-03-11: qty 1

## 2021-03-11 MED ORDER — HALOPERIDOL LACTATE 5 MG/ML IJ SOLN
5.0000 mg | Freq: Four times a day (QID) | INTRAMUSCULAR | Status: DC | PRN
  Administered 2021-03-11 – 2021-03-12 (×3): 5 mg via INTRAVENOUS
  Filled 2021-03-11 (×3): qty 1

## 2021-03-11 MED ORDER — SODIUM CHLORIDE 0.9 % IV BOLUS
1000.0000 mL | Freq: Once | INTRAVENOUS | Status: AC
  Administered 2021-03-11: 05:00:00 1000 mL via INTRAVENOUS

## 2021-03-11 MED ORDER — MORPHINE SULFATE (PF) 2 MG/ML IV SOLN
INTRAVENOUS | Status: AC
  Filled 2021-03-11: qty 1

## 2021-03-11 MED ORDER — BISACODYL 10 MG RE SUPP: 10.0000 mg | Freq: Once | RECTAL | Status: DC

## 2021-03-11 NOTE — Progress Notes (Signed)
CRITICAL CARE PROGRESS NOTE    Name: WILBERTH DAMON MRN: [id number] DOB: 01-23-1946     LOS: 2   SUBJECTIVE FINDINGS & SIGNIFICANT EVENTS    Patient description:    This is a 75yo M with hx of HTN, dyslipidemia RHA, CKD3, CAD, lymphoma, CHF EF40%, GERD, who came in with DOE/SOB at rest with abd pain.  Family reported AMS x 3d, they denied dementia at baseline.  He does have recent medical acute issue of abdominal wall cellulitis.He has PCR + on 03/08/21 with cycle threshold value 17.  He is partial code with OK to intubate NO cpr advanced directive. PCCM consultation for acute hypoxemic respiratory failure due to [place] with concomitant abd wall cellulitis.   I met with son and granddaughter who is RN here and we reviewed findings and medical plan.   03/11/21- reviewed care plan with son today. Patient on HFNC with intermittent desaturation. 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  Overall prognosis is poor and son wants to keep maximal medical management for now.   Lines/tubes :   Microbiology/Sepsis markers: Results for orders placed or performed during the hospital encounter of 03/08/2021  Blood Culture (routine x 2)     Status: None (Preliminary result)   Collection Time: 03/18/2021 11:26 AM   Specimen: BLOOD RIGHT HAND  Result Value Ref Range Status   Specimen Description BLOOD RIGHT HAND  Final   Special Requests   Final    BOTTLES DRAWN AEROBIC AND ANAEROBIC Blood Culture results may not be optimal due to an inadequate volume of blood received in culture bottles   Culture   Final    NO GROWTH 2 DAYS Performed at [facility], [address] [place],  [address]    Report Status PENDING  Incomplete  Resp Panel by RT-PCR (Flu A&B, Covid) Nasopharyngeal  Swab     Status: Abnormal   Collection Time: 03/10/2021  4:07 PM   Specimen: Nasopharyngeal Swab; Nasopharyngeal(NP) swabs in vial transport medium  Result Value Ref Range Status   SARS Coronavirus 2 by RT PCR POSITIVE (A) NEGATIVE Final    Comment: (NOTE) SARS-CoV-2 target nucleic acids are DETECTED.  The SARS-CoV-2 RNA is generally detectable in upper respiratory specimens during the acute phase of infection. Positive results are indicative of the presence of the identified virus, but do not rule out bacterial infection or co-infection with other pathogens not detected by the test. Clinical correlation with patient history and other diagnostic information is necessary to determine patient infection status. The expected result is Negative.  Fact Sheet for Patients: EntrepreneurPulse.com.au  Fact Sheet for Healthcare Providers: IncredibleEmployment.be  This test is not yet approved or cleared by the [place] FDA and  has been authorized for detection and/or diagnosis of SARS-CoV-2 by FDA under an Emergency Use Authorization (EUA).  This EUA will remain in effect (meaning this test can be used) for the duration of  the COVID-19 declaration under Section 564(b)(1) of the A ct, 21 U.S.C. section 360bbb-3(b)(1), unless the authorization is terminated or revoked sooner.     Influenza A by PCR NEGATIVE NEGATIVE Final   Influenza B by PCR NEGATIVE NEGATIVE Final    Comment: (NOTE) The Xpert Xpress SARS-CoV-2/FLU/RSV plus assay is intended as an aid in the diagnosis of influenza from Nasopharyngeal swab specimens and should not be used as a sole basis for treatment. Nasal washings and aspirates are unacceptable for Xpert Xpress SARS-CoV-2/FLU/RSV testing.  Fact Sheet for Patients: EntrepreneurPulse.com.au  Fact Sheet for Healthcare Providers: IncredibleEmployment.be  This test is not yet approved or cleared  by the [place] FDA and has been authorized for detection and/or diagnosis of SARS-CoV-2 by FDA under an Emergency Use Authorization (EUA). This EUA will remain in effect (meaning this test can be used) for the duration of the COVID-19 declaration under Section 564(b)(1) of the Act, 21 U.S.C. section 360bbb-3(b)(1), unless the authorization is terminated or revoked.  Performed at [facility], [address] [place], [place] [address]   Blood Culture (routine x 2)     Status: None (Preliminary result)   Collection Time: 03/18/2021  4:10 PM   Specimen: BLOOD  Result Value Ref Range Status   Specimen Description BLOOD [facility]  Final   Special Requests BOTTLES DRAWN AEROBIC AND ANAEROBIC BCAV  Final   Culture   Final    NO GROWTH 2 DAYS Performed at [facility], [address], [place], [place] [address]    Report Status PENDING  Incomplete    Anti-infectives:  Anti-infectives (From admission, onward)    Start     Dose/Rate Route Frequency Ordered Stop   03/10/21 1215  vancomycin (VANCOCIN) IVPB 1000 mg/200 mL premix        1,000 mg 200 mL/hr over 60 Minutes Intravenous Every 24 hours 03/10/21 1127     03/10/21 1200  vancomycin (VANCOREADY) IVPB 750 mg/150 mL  Status:  Discontinued        750 mg 150 mL/hr over 60 Minutes Intravenous Every 24 hours 03/01/2021 1439 03/10/21 1127   03/10/21 1000  remdesivir 100 mg in sodium chloride 0.9 % 100 mL IVPB  Status:  Discontinued       See Hyperspace for full Linked Orders Report.   100 mg 200 mL/hr over 30 Minutes Intravenous Daily 03/22/2021 1529 03/10/21 1004   03/10/21 0000  ceFEPIme (MAXIPIME) 2 g in sodium chloride 0.9 % 100 mL IVPB        2 g 200 mL/hr over 30 Minutes Intravenous Every 12 hours 03/05/2021 1439     02/23/2021 2200  metroNIDAZOLE (FLAGYL) IVPB 500 mg        500 mg 100 mL/hr over 60 Minutes Intravenous Every 12 hours 02/27/2021 1425     03/17/2021 1700  remdesivir 200 mg in sodium chloride 0.9% 250 mL IVPB   Status:  Discontinued       See Hyperspace for full Linked Orders Report.   200 mg 580 mL/hr over 30 Minutes Intravenous Once 03/15/2021 1529 03/10/21 1004   02/22/2021 1200  vancomycin (VANCOREADY) IVPB 1250 mg/250 mL        1,250 mg 166.7 mL/hr over 90 Minutes Intravenous  Once 03/24/2021 1125 02/24/2021 1457   03/24/2021 1130  ceFEPIme (MAXIPIME) 2 g in sodium chloride 0.9 % 100 mL IVPB        2 g 200 mL/hr over 30 Minutes Intravenous  Once 03/19/2021 1122 03/18/2021 1246   02/24/2021 1130  metroNIDAZOLE (FLAGYL) IVPB 500 mg        500 mg 100 mL/hr over 60 Minutes Intravenous  Once 03/03/2021 1122 03/16/2021 1246        Consults: Treatment Team:  PappayliouWorthy Keeler, MD     PAST MEDICAL HISTORY   Past Medical History:  Diagnosis Date   Arthritis    rheumatoid arthritis, takes prednisone for this   Barrett's esophagus    Cancer ([place]) 1999   non hodgkins  lymphoma   Chronic back pain    Chronic pain syndrome    Claustrophobia    Coronary artery disease    GERD (gastroesophageal reflux disease)    barretts esophagus that requires dilation every couple years   Hemorrhoids    Hernia of abdominal cavity    History of hiatal hernia    Hyperlipidemia    Hypertension    Lumbago    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   Wears dentures    Weight loss 11/2017   30 lb weight loss over 3 years     SURGICAL HISTORY   Past Surgical History:  Procedure Laterality Date   [place]   2 fusions then 1 surgery to fix the other two (lower)..plates and screws   CATARACT EXTRACTION W/PHACO Left 12/31/2018   Procedure: CATARACT EXTRACTION PHACO AND INTRAOCULAR LENS PLACEMENT ([place]) LEFT VISION BLUE;  Surgeon: Marchia Meiers, MD;  Location: [facility] ORS;  Service: Ophthalmology;  Laterality: Left;  Lot #[id number] H [place]: 01:51.7 CDE: 18.59   CYSTECTOMY     from buttocks   ESOPHAGEAL DILATION     multiple times    ESOPHAGOGASTRODUODENOSCOPY (EGD) WITH PROPOFOL N/A 12/05/2014   Procedure: ESOPHAGOGASTRODUODENOSCOPY (EGD) WITH PROPOFOL;  Surgeon: Hulen Luster, MD;  Location: [facility] ENDOSCOPY;  Service: Gastroenterology;  Laterality: N/A;   EXCISION PARTIAL PHALANX Right 12/09/2017   Procedure: EXCISION PARTIAL PHALANX-GREAT TOE;  Surgeon: Sharlotte Alamo, DPM;  Location: [facility] ORS;  Service: Podiatry;  Laterality: Right;   FOOT SURGERY     intestine repair  1998   during biopsy his intestines were nicked requiring repair   MULTIPLE TOOTH EXTRACTIONS     NECK SURGERY  1996   fused 2 vertebrae in neck (plate plus screws)   RADIOLOGY WITH ANESTHESIA N/A 03/09/2019   Procedure: MRI WITH ANESTHESIA LUMBAR WITHOUT CONTRAST;  Surgeon: Radiologist, Medication, MD;  Location: [place];  Service: Radiology;  Laterality: N/A;   RADIOLOGY WITH ANESTHESIA N/A 09/23/2019   Procedure: MRI WITH ANESTHESIA; LUMBAR SPINE WITHOUT CONTRAST;  Surgeon: Radiologist, Medication, MD;  Location: [place];  Service: Radiology;  Laterality: N/A;   Reduction masseter muscle/bone extraoral       FAMILY HISTORY   Family History  Problem Relation Age of Onset   Heart disease Mother        heart attack   Arthritis Mother    Arthritis Father      SOCIAL HISTORY   Social History   Tobacco Use   Smoking status: Former    Types: Cigarettes    Quit date: 1998    Years since quitting: 24.9   Smokeless tobacco: Never  Vaping Use   Vaping Use: Never used  Substance Use Topics   Alcohol use: No    Comment: stopped d/t cluster headaches   Drug use: No     MEDICATIONS   Current Medication:  Current Facility-Administered Medications:    0.9 %  sodium chloride infusion, 250 mL, Intravenous, Continuous, Darel Hong D, NP, Stopped at 03/11/21 0445   albuterol (PROVENTIL) (2.5 MG/3ML) 0.083% nebulizer solution 2.5 mg, 2.5 mg, Nebulization, Q4H PRN, Ivor Costa, MD   bisacodyl (DULCOLAX) suppository 10 mg, 10 mg, Rectal, Once, Pappayliou,  Catherine A, DO   ceFEPIme (MAXIPIME) 2 g in sodium chloride 0.9 % 100 mL IVPB, 2 g, Intravenous, Q12H, Rauer, Samantha O, RPH, Last Rate: 200 mL/hr at 03/11/21 0002, 2 g at 03/11/21 0002   Chlorhexidine  Gluconate Cloth 2 % PADS 6 each, 6 each, Topical, Daily, Ottie Glazier, MD, 6 each at 03/10/21 1021   dexmedetomidine (PRECEDEX) 400 MCG/100ML (4 mcg/mL) infusion, 0.4-1.2 mcg/kg/hr, Intravenous, Titrated, Ottie Glazier, MD, Last Rate: 17.49 mL/hr at 03/11/21 0441, 1.2 mcg/kg/hr at 03/11/21 0441   dextromethorphan-guaiFENesin ([place] DM) 30-600 MG per 12 hr tablet 1 tablet, 1 tablet, Oral, BID PRN, Ivor Costa, MD   enoxaparin (LOVENOX) injection 40 mg, 40 mg, Subcutaneous, Q24H, Ivor Costa, MD, 40 mg at 03/10/21 1836   haloperidol lactate (HALDOL) injection 2 mg, 2 mg, Intravenous, Q6H PRN, Darel Hong D, NP, 2 mg at 03/10/21 1837   hydrALAZINE (APRESOLINE) injection 5 mg, 5 mg, Intravenous, Q2H PRN, Ivor Costa, MD   HYDROmorphone (DILAUDID) injection 0.5 mg, 0.5 mg, Intravenous, Q4H PRN, Darel Hong D, NP, 0.5 mg at 03/11/21 0615   ipratropium-albuterol (DUONEB) 0.5-2.5 (3) MG/3ML nebulizer solution 3 mL, 3 mL, Nebulization, Q6H, Darel Hong D, NP, 3 mL at 03/11/21 0752   methylPREDNISolone sodium succinate (SOLU-MEDROL) 40 mg/mL injection 40 mg, 40 mg, Intravenous, Q12H, Ivor Costa, MD, 40 mg at 03/11/21 0242   metroNIDAZOLE (FLAGYL) IVPB 500 mg, 500 mg, Intravenous, Q12H, Ivor Costa, MD, Last Rate: 100 mL/hr at 03/10/21 2133, 500 mg at 03/10/21 2133   morphine 2 MG/ML injection 2 mg, 2 mg, Intravenous, Q4H PRN, Lang Snow, NP   norepinephrine (LEVOPHED) 20m in 2529m(0.016 mg/mL) premix infusion, 2-10 mcg/min, Intravenous, Titrated, KeDarel Hong, NP, Stopping Infusion hung by another clincian at 03/16/2021 2200   senna-docusate (Senokot-S) tablet 1 tablet, 1 tablet, Oral, BID, Pappayliou, Catherine A, DO   vancomycin (VANCOCIN) IVPB 1000 mg/200 mL premix, 1,000 mg,  Intravenous, Q24H, PaOswald HillockRPH, Last Rate: 200 mL/hr at 03/10/21 1308, 1,000 mg at 03/10/21 1308    ALLERGIES   Ativan [lorazepam] and Gold au 198 [gold]    REVIEW OF SYSTEMS     10 point ROS done and patient is confused with encephalopathy  PHYSICAL EXAMINATION   Vital Signs: Temp:  [98.2 F (36.8 C)-98.6 F (37 C)] 98.6 F (37 C) (12/18 0446) Pulse Rate:  [67-128] 98 (12/18 0600) Resp:  [18-35] 33 (12/18 0600) BP: (98-144)/(53-110) 121/84 (12/18 0600) SpO2:  [82 %-99 %] 90 % (12/18 0752) FiO2 (%):  [70 %-100 %] 100 % (12/18 0752)  GENERAL:Age appropriate HEAD: Normocephalic, atraumatic.  EYES: Pupils equal, round, reactive to light.  No scleral icterus.  MOUTH: Moist mucosal membrane. NECK: Supple. No thyromegaly. No nodules. No JVD.  PULMONARY: Rhonchi bilaterally  CARDIOVASCULAR: S1 and S2. Regular rate and rhythm. No murmurs, rubs, or gallops.  GASTROINTESTINAL: Soft +tender, +distended. No masses. Negative bowel sounds. No hepatosplenomegaly.  MUSCULOSKELETAL: No swelling, clubbing, or edema.  NEUROLOGIC: Mild distress due to acute illness GCS9 SKIN:intact,warm,dry   PERTINENT DATA     Infusions:  sodium chloride Stopped (03/11/21 0445)   ceFEPime (MAXIPIME) IV 2 g (03/11/21 0002)   dexmedetomidine (PRECEDEX) IV infusion 1.2 mcg/kg/hr (03/11/21 0441)   metronidazole 500 mg (03/10/21 2133)   norepinephrine (LEVOPHED) Adult infusion Stopped (02/24/2021 2200)   vancomycin 1,000 mg (03/10/21 1308)   Scheduled Medications:  bisacodyl  10 mg Rectal Once   Chlorhexidine Gluconate Cloth  6 each Topical Daily   enoxaparin (LOVENOX) injection  40 mg Subcutaneous Q24H   ipratropium-albuterol  3 mL Nebulization Q6H   methylPREDNISolone (SOLU-MEDROL) injection  40 mg Intravenous Q12H   senna-docusate  1 tablet Oral BID   PRN Medications: albuterol, dextromethorphan-guaiFENesin, haloperidol lactate,  hydrALAZINE, HYDROmorphone (DILAUDID) injection,  morphine injection Hemodynamic parameters:   Intake/Output: 12/17 0701 - 12/18 0700 In: 1157.4 [I.V.:394.9; IV Piggyback:662.5] Out: 2070 [Urine:2070]  Ventilator  Settings: FiO2 (%):  [70 %-100 %] 100 %   LAB RESULTS:  Basic Metabolic Panel: Recent Labs  Lab 03/17/2021 1126 03/10/21 0752 03/11/21 0251  NA 135 141 141  K 3.5 3.6 3.4*  CL 96* 103 104  CO2 24 21* 23  GLUCOSE 92 134* 119*  BUN 38* 38* 41*  CREATININE 1.43* 1.28* 1.26*  CALCIUM 8.5* 7.6* 7.7*  MG  --  2.1  --   PHOS  --  5.1*  --     Liver Function Tests: Recent Labs  Lab 03/15/2021 1126 03/10/21 0752 03/11/21 0251  AST 332* 196* 132*  ALT 72* 66* 63*  ALKPHOS 82 68 66  BILITOT 1.3* 1.5* 1.3*  PROT 6.6 5.8* 5.7*  ALBUMIN 3.0* 2.4* 2.4*    No results for input(s): LIPASE, AMYLASE in the last 168 hours. Recent Labs  Lab 03/22/2021 1824  AMMONIA 20    CBC: Recent Labs  Lab 03/08/2021 1126 03/10/21 0752 03/11/21 0251  WBC 17.1* 13.2* 10.5  NEUTROABS 15.4* 11.4* 9.6*  HGB 13.2 11.6* 11.2*  HCT 39.3 35.2* 33.8*  MCV 92.9 93.6 93.6  PLT 213 197 160    Cardiac Enzymes: No results for input(s): CKTOTAL, CKMB, CKMBINDEX, TROPONINI in the last 168 hours. BNP: Invalid input(s): POCBNP CBG: Recent Labs  Lab 02/28/2021 1604  GLUCAP 86        IMAGING RESULTS:  Imaging: CT HEAD WO CONTRAST (5MM)  Result Date: 02/23/2021 CLINICAL DATA:  Mental status change EXAM: CT HEAD WITHOUT CONTRAST TECHNIQUE: Contiguous axial images were obtained from the base of the skull through the vertex without intravenous contrast. COMPARISON:  None. FINDINGS: Brain: Hypodensity in the right anterior frontal lobe (series 2, image 19), of indeterminate acuity but favored to be subacute to chronic. No evidence of hemorrhage, cerebral edema, mass, mass effect, or midline shift. No hydrocephalus or extra-axial fluid collection. Periventricular white matter changes, likely the sequela of chronic small vessel ischemic  disease. Lacunar infarct in the left basal ganglia. Vascular: No hyperdense vessel. Skull: Normal. Negative for fracture or focal lesion. Sinuses/Orbits: No acute finding. Status post left lens replacement. Other: The mastoid air cells are well aerated. IMPRESSION: IMPRESSION Hypodensity in the right anterior frontal lobe, of indeterminate acuity but favored to be subacute to chronic. Electronically Signed   By: Merilyn Baba M.D.   On: 03/05/2021 22:29   CT ABDOMEN PELVIS W CONTRAST  Result Date: 03/11/2021 CLINICAL DATA:  Abdominal pain, acute, nonlocalized. COVID pneumonia. EXAM: CT ABDOMEN AND PELVIS WITH CONTRAST TECHNIQUE: Multidetector CT imaging of the abdomen and pelvis was performed using the standard protocol following bolus administration of intravenous contrast. CONTRAST:  158m OMNIPAQUE IOHEXOL 300 MG/ML  SOLN COMPARISON:  03/04/2021 FINDINGS: Lower chest: There is extensive bibasilar ground-glass pulmonary infiltrate and focal consolidation within the right lower lobe in keeping with multifocal pneumonic infiltrate. No pleural effusion. Extensive multi-vessel coronary artery calcification. Global cardiac size within normal limits. Moderate hiatal hernia. Hepatobiliary: No focal liver abnormality is seen. No gallstones, gallbladder wall thickening, or biliary dilatation. High attenuation material within the gallbladder lumen likely reflects vicarious excretion of contrast. Pancreas: Unremarkable Spleen: Unremarkable Adrenals/Urinary Tract: The adrenal glands are unremarkable. The kidneys are normal in size and position. Multiple simple cortical cyst noted within the left kidney. There is progressive perinephric soft tissue infiltration surrounding the  upper pole the right kidney of unclear significance. This may represent mild progressive hemorrhage or urine in the setting of trauma. No significant subcapsular hematoma, however, identified. No hydronephrosis. No intrarenal or ureteral calculi.  Foley catheter balloon is seen within a decompressed bladder lumen. Stomach/Bowel: Diastasis of the rectus abdominus musculature and laxity of the anterior abdominal wall again noted with marked attenuation of the overlying subcutaneous fat. The stomach, small bowel, and large bowel are unremarkable. No free intraperitoneal gas or fluid. Vascular/Lymphatic: Aortic atherosclerosis. No enlarged abdominal or pelvic lymph nodes. Reproductive: Prostate is unremarkable. Other: None Musculoskeletal: L4-S1 lumbar fusion with instrumentation is again noted. Advanced degenerative changes are noted throughout the visualized thoracolumbar spine. Remote T9 and T11 compression deformities are again noted. 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 No acute bone abnormality. IMPRESSION: Extensive bibasilar pulmonary infiltrate, likely infectious or inflammatory, and in keeping with the given history of acute COVID-19 pneumonia. Extensive multi-vessel coronary artery calcification. Slight interval increase in mild perinephric infiltrative fluid surrounding the upper pole the right kidney. Correlation for history of local trauma may be helpful as this may represent minimal perinephric hemorrhage or urine. Moderate hiatal hernia. Stable remote compression deformities of T9 and T11. 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 Electronically Signed   By: Fidela Salisbury M.D.   On: 03/11/2021 02:57   CT ABDOMEN PELVIS W CONTRAST  Result Date: 03/10/2021 CLINICAL DATA:  Abdominal distension, being treated for open wound from hernia surgery concern for bowel obstruction. EXAM: CT ABDOMEN AND PELVIS WITH CONTRAST TECHNIQUE: Multidetector CT imaging of the abdomen and pelvis was performed using the standard protocol following bolus administration of intravenous contrast. CONTRAST:  169m OMNIPAQUE IOHEXOL 300 MG/ML  SOLN COMPARISON:  None. FINDINGS: Lower chest: Mosaic attenuation of the lung bases with  geographic regions of ground-glass opacities and more nodular consolidative areas. Normal size heart. Three-vessel coronary artery disease. Hepatobiliary: No suspicious hepatic lesion. Gallbladder is distended without wall thickening or pericholecystic fluid. No biliary ductal dilation. Pancreas: No pancreatic ductal dilation or evidence of acute inflammation. Diffuse pancreatic atrophy. Spleen: Within normal limits. Adrenals/Urinary Tract: Bilateral adrenal glands are unremarkable. No hydronephrosis. Left renal cysts measuring up to 3.2 cm. Urinary bladder is unremarkable for degree of distension. Stomach/Bowel: Large hiatal hernia. No pathologic dilation of large or small bowel. No evidence of acute bowel inflammation. Large fat and nonobstructed bowel containing ventral hernia with a 6.4 cm aperture width. Laxity of the ventral abdominal wall containing fat and bowel which appears to extend to the left paramedian cutaneous surface. Vascular/Lymphatic: Aortic and branch vessel atherosclerosis without abdominal aortic aneurysm. No pathologically enlarged abdominal or pelvic lymph nodes. Reproductive: Prostate is unremarkable. Other: No significant abdominopelvic free fluid. Musculoskeletal: Degenerative and postoperative changes of the spine. Similar appearance of the T9 and T11 vertebral compression deformities. Avascular necrosis of the left femoral head. IMPRESSION: 1. No evidence of bowel obstruction. 2. Large fat and nonobstructed bowel containing ventral hernia with a 6.4 cm aperture. 3. Laxity of the ventral abdominal wall with peritoneal fat and loops of nonobstructed bowel extending to the cutaneous skin surface. 4. Mosaic attenuation of the lung bases with geographic regions of ground-glass opacities and more nodular consolidative areas likely reflects infectious or inflammatory process. 5. Large hiatal hernia. 6. Avascular necrosis of the left femoral head. 7. Similar appearance of the T9 and T11  vertebral compression deformities. 8.  Aortic Atherosclerosis (ICD10-I70.0). Electronically Signed   By: JAndree MoroD.  On: 03/05/2021 13:46   US Venous Img Lower Bilateral (DVT)  Result Date: 03/10/2021 CLINICAL DATA:  75 year old male with lower extremity swelling. EXAM: BILATERAL LOWER EXTREMITY VENOUS DOPPLER ULTRASOUND TECHNIQUE: Gray-scale sonography with graded compression, as well as color Doppler and duplex ultrasound were performed to evaluate the lower extremity deep venous systems from the level of the common femoral vein and including the common femoral, femoral, profunda femoral, popliteal and calf veins including the posterior tibial, peroneal and gastrocnemius veins when visible. The superficial great saphenous vein was also interrogated. Spectral Doppler was utilized to evaluate flow at rest and with distal augmentation maneuvers in the common femoral, femoral and popliteal veins. COMPARISON:  None. FINDINGS: RIGHT LOWER EXTREMITY Common Femoral Vein: No evidence of thrombus. Normal compressibility, respiratory phasicity and response to augmentation. Saphenofemoral Junction: No evidence of thrombus. Normal compressibility and flow on color Doppler imaging. Profunda Femoral Vein: No evidence of thrombus. Normal compressibility and flow on color Doppler imaging. Femoral Vein: No evidence of thrombus. Normal compressibility, respiratory phasicity and response to augmentation. Popliteal Vein: No evidence of thrombus. Normal compressibility, respiratory phasicity and response to augmentation. Calf Veins: Limited evaluation, however no evidence of thrombus. Other Findings:  None. LEFT LOWER EXTREMITY Common Femoral Vein: No evidence of thrombus. Normal compressibility, respiratory phasicity and response to augmentation. Saphenofemoral Junction: No evidence of thrombus. Normal compressibility and flow on color Doppler imaging. Profunda Femoral Vein: No evidence of thrombus. Normal  compressibility and flow on color Doppler imaging. Femoral Vein: No evidence of thrombus. Normal compressibility, respiratory phasicity and response to augmentation. Popliteal Vein: No evidence of thrombus. Normal compressibility, respiratory phasicity and response to augmentation. Calf Veins: Limited evaluation, however no evidence of thrombus. Other Findings:  None. IMPRESSION: No evidence of bilateral lower extremity deep vein thrombosis. Limited evaluation of the calf veins. Ruthann Cancer, MD [facility] Electronically Signed   By: Ruthann Cancer M.D.   On: 03/10/2021 09:23   DG Chest Port 1 View  Result Date: 03/05/2021 CLINICAL DATA:  Questionable sepsis.  Evaluate for abnormality. EXAM: PORTABLE CHEST 1 VIEW COMPARISON:  09/06/2020 FINDINGS: Patchy densities throughout the mid and lower right chest. Patchy densities in the left lower chest. These lung densities are new. Surgical hardware in the lower cervical spine. Heart size is grossly stable. IMPRESSION: Bilateral lung densities, right side greater than left. Findings are suggestive for bilateral pneumonia. Electronically Signed   By: Markus Daft M.D.   On: 03/17/2021 11:42   _0 @ CT ABDOMEN PELVIS W CONTRAST  Result Date: 03/11/2021 CLINICAL DATA:  Abdominal pain, acute, nonlocalized. COVID pneumonia. EXAM: CT ABDOMEN AND PELVIS WITH CONTRAST TECHNIQUE: Multidetector CT imaging of the abdomen and pelvis was performed using the standard protocol following bolus administration of intravenous contrast. CONTRAST:  111m OMNIPAQUE IOHEXOL 300 MG/ML  SOLN COMPARISON:  02/28/2021 FINDINGS: Lower chest: There is extensive bibasilar ground-glass pulmonary infiltrate and focal consolidation within the right lower lobe in keeping with multifocal pneumonic infiltrate. No pleural effusion. Extensive multi-vessel coronary artery calcification. Global cardiac size within normal limits. Moderate  hiatal hernia. Hepatobiliary: No focal liver abnormality is seen. No gallstones, gallbladder wall thickening, or biliary dilatation. High attenuation material within the gallbladder lumen likely reflects vicarious excretion of contrast. Pancreas: Unremarkable Spleen: Unremarkable Adrenals/Urinary Tract: The adrenal glands are unremarkable. The kidneys are normal in size and position. Multiple simple cortical cyst noted within the left kidney. There is progressive perinephric soft tissue infiltration surrounding the upper pole the right kidney  of unclear significance. This may represent mild progressive hemorrhage or urine in the setting of trauma. No significant subcapsular hematoma, however, identified. No hydronephrosis. No intrarenal or ureteral calculi. Foley catheter balloon is seen within a decompressed bladder lumen. Stomach/Bowel: Diastasis of the rectus abdominus musculature and laxity of the anterior abdominal wall again noted with marked attenuation of the overlying subcutaneous fat. The stomach, small bowel, and large bowel are unremarkable. No free intraperitoneal gas or fluid. Vascular/Lymphatic: Aortic atherosclerosis. No enlarged abdominal or pelvic lymph nodes. Reproductive: Prostate is unremarkable. Other: None Musculoskeletal: L4-S1 lumbar fusion with instrumentation is again noted. Advanced degenerative changes are noted throughout the visualized thoracolumbar spine. Remote T9 and T11 compression deformities are again noted. 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 No acute bone abnormality. IMPRESSION: Extensive bibasilar pulmonary infiltrate, likely infectious or inflammatory, and in keeping with the given history of acute COVID-19 pneumonia. Extensive multi-vessel coronary artery calcification. Slight interval increase in mild perinephric infiltrative fluid surrounding the upper pole the right kidney. Correlation for history of local trauma may be helpful as this may  represent minimal perinephric hemorrhage or urine. Moderate hiatal hernia. Stable remote compression deformities of T9 and T11. 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 Electronically Signed   By: Fidela Salisbury M.D.   On: 03/11/2021 02:57        ASSESSMENT AND PLAN    -Multidisciplinary rounds held today   Acute COVID19 pneumonia -Remdesevir declined by family  -steroids - currently solumedrol 40 bid -Diuresis - Lasix 40 IV daily - monitor UOP - utilize external urinary catheter if possible -Self prone if patient can tolerate  -encourage to use IS and Acapella device for bronchopulmonary hygiene when able -d/c hepatotoxic medications while on remdesevir -supportive care with ICU telemetry monitoring -PT/OT when possible -procalcitonin, CRP and ferritin trending -on HFNC - reviewed with RT    Abdominal wall cellulitis -ventral hernia - surgery on case appreciate management and recommendations -CT abdomen- 1. No evidence of bowel obstruction. 2. Large fat and nonobstructed bowel containing ventral hernia with a 6.4 cm aperture. 3. Laxity of the ventral abdominal wall with peritoneal fat and loops of nonobstructed bowel extending to the cutaneous skin surface. ICU monitoring-    Altered mental status with encephalopathy  Due to COVID19    - there is aggitation with hyperactive delerium and confusion - have initiated low dose precedex.    Rheumatoid arthritis With severe deforming arthropathy of hands and feet bilaterally   - signficant source of pain and discomfort -continue solumedrol bid 52m iv  Renal Failure-most likely due to ATN -follow chem 7 -follow UO -continue Foley Catheter-assess need daily   Avascular necrosis of left femoral head    - chronicity unknown    - will address post recovery from COVID    -elevated risk for BM emobli    Septic shock- RULED OUT -use vasopressors to keep MAP>65 -follow ABG and LA -follow up  cultures -emperic ABX -consider stress dose steroids   ID -continue IV abx as prescibed -follow up cultures   GI/Nutrition GI PROPHYLAXIS as indicated DIET-->TF's as tolerated Constipation protocol as indicated  ENDO - ICU hypoglycemic\Hyperglycemia protocol -check FSBS per protocol   ELECTROLYTES -follow labs as needed -replace as needed -pharmacy consultation   DVT/GI PRX ordered -SCDs  TRANSFUSIONS AS NEEDED MONITOR FSBS ASSESS the need for LABS as needed   Critical care provider statement:   Total critical care time: 33 minutes   Performed  byLanney Gins MD   Critical care time was exclusive of separately billable procedures and treating other patients.   Critical care was necessary to treat or prevent imminent or life-threatening deterioration.   Critical care was time spent personally by me on the following activities: development of treatment plan with patient and/or surrogate as well as nursing, discussions with consultants, evaluation of patient's response to treatment, examination of patient, obtaining history from patient or surrogate, ordering and performing treatments and interventions, ordering and review of laboratory studies, ordering and review of radiographic studies, pulse oximetry and re-evaluation of patient's condition.    Ottie Glazier, M.D.  Pulmonary & Critical Care Medicine

## 2021-03-11 NOTE — Progress Notes (Signed)
PROGRESS NOTE    Travis Haynes  GMW:[id number] DOB: 1945/10/13 DOA: 02/27/2021 PCP: Venita Lick, NP   Brief Narrative: Taken from H&P and prior notes.  Travis Haynes is a 75 y.o. male with medical history significant of hypertension, hyperlipidemia, GERD, rheumatoid arthritis, CKD stage IIIa, CAD, non-Hodgkin lymphoma, CHF with EF of 40-45%, TV (s/p of treatment 2015), GERD, who presents with altered mental status, shortness breath, abdominal pain.   Per his son at bedside, patient had positive COVID test on 12/15, and was noted to become confused.  Normally patient is alert oriented x3.  He was also found to have increased edema and erythema of a chronic ventral hernia, they have noticed that it has been protruded recently and becoming more painful and erythematous.  Patient was started on Keflex as an outpatient with no improvement. On arrival he had leukocytosis, lactic acidosis, AKI, transaminitis, tachycardia and tachypnea.  He was desaturating to 88%, started requiring higher level of oxygen, currently on 55 L of heated high flow at 100% FiO2. Family is refusing remdesivir as they were very concerned about his liver and kidneys. PCCM was consulted due to concern of persistent hypotension, initially vasopressor were ordered but never started.  Blood pressure improved with IV fluid only.  Patient met sepsis criteria with tachycardia, tachypnea, leukocytosis, lactic acidosis, AKI, transaminitis and hypotension.  Septic shock ruled out as he never required pressors.  Most likely secondary to infection at ventral hernia site, and COVID-19 pneumonia.  General surgery was also consulted.  CT abdomen with no evidence of bowel obstruction.  See the full report.  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  General surgery is recommending conservative management with antibiotics at this time.  Chest x-ray with bilateral lung densities, right greater than left,  suggestive of bilateral pneumonia.  Patient was started on empiric antibiotics with cefepime, Flagyl and vancomycin per sepsis protocol.  Patient was very agitated overnight, keeps repeating that I am dying.  Received Haldol overnight and started on low-dose Precedex infusion by PCCM in the morning.  Patient is high risk for deterioration and death, appropriate for comfort measures only.  Family is little reluctant and need few more days to decide.  Patient might not survive.  12/18: Patient was desaturating on maximum setting of heated high flow to 60s.  Appears little agitated, not following any commands.  PCCM started the discussion about comfort measures but family is not ready yet.  We added nonrebreather with some improvement in oxygenation. CT abdomen was obtained overnight due to worsening abdominal pain which shows perinephric fluid collection, urology was consulted and they were recommending observation for now. His diuretic was held by nighttime provider due to worsening lactic acidosis.  Subjective: Patient was seen and examined today.  Appears little agitated at times, concern of some pain.  He was saturating in 60s on maximum setting of heated high flow with FiO2 of 100% and 60 L. Added nonrebreather with improvement in saturation.  Assessment & Plan:   Principal Problem:   Acute respiratory disease due to COVID-19 virus Active Problems:   Essential hypertension   Hyperlipidemia   Rheumatoid arthritis ([place])   History of non-Hodgkin's lymphoma   CAD (coronary artery disease)   Chronic combined systolic and diastolic CHF (congestive heart failure) (HCC)   Acute respiratory failure with hypoxia (HCC)   Severe sepsis (HCC)   Ventral hernia   Abdominal wall cellulitis   Abnormal LFTs   Acute metabolic  encephalopathy   CKD (chronic kidney disease), stage IIIa  Acute respiratory failure with hypoxia secondary to COVID-19 pneumonia.  Family is reluctant for remdesivir due to  his liver and kidney abnormalities. Currently requiring higher level of oxygen.  Patient is DNR and DNI.  Elevated inflammatory markers. D-dimer above 14>>8.83, lower extremity venous Doppler was negative for DVT.  VQ scan ordered, still pending.  Desaturating on maximum setting of heated high flow.  Worsening lactic acidosis. -Add nonrebreather -Continue with Solu-Medrol -Continue with supplemental oxygen-try weaning if tolerated -Continue with supportive care -Palliative care consult-patient is very high risk for death.  Severe sepsis secondary to abdominal wall cellulitis and COVID-19 pneumonia.  Patient met sepsis criteria with leukocytosis, tachycardia and tachypnea with endorgan damage causing lactic acidosis, AKI and transaminitis.  Blood cultures negative in 2 days. Worsening lactic acidosis secondary to hypoxia most likely. -Continue with empiric antibiotics, currently on cefepime, Flagyl and vancomycin.  Agitation/delirium.  Patient requiring multiple doses of Haldol overnight. -Started on low-dose Precedex by PCCM -Add low-dose morphine  Essential hypertension.  Blood pressure currently within goal.  He was hypotensive before. -Keep holding home antihypertensives -Monitor blood pressure  Hyperlipidemia. -Holding home dose of Lipitor due to transaminitis  Rheumatoid arthritis. -Holding home sulfasalazine  History of non-Hodgkin's lymphoma --Follow-up oncology   CAD (coronary artery disease) -Hold Lipitor due to abnormal liver function and AMS, also hold ASA until mental status improves  AKI with CKD stage IIIa.  Some improvement in creatinine with IV fluid.,  Currently at 1.26 with baseline of 1.1. -Monitor renal function -Avoid nephrotoxins  Chronic combined diastolic and systolic CHF: 2D echo on 0/04/6376 showed EF of 40-45% with grade 1 diastolic dysfunction.   -Holding IV Lasix due to worsening lactic acidosis. -Daily BMP and weight -Strict intake and  output   Objective: Vitals:   03/11/21 0900 03/11/21 1000 03/11/21 1100 03/11/21 1200  BP: 127/87 110/64 108/66 114/75  Pulse: (!) 108 97 82 (!) 102  Resp: 17 (!) 25 (!) 29 (!) 30  Temp:      TempSrc:      SpO2: (!) 81% 91% (!) 75% (!) 74%  Weight:      Height:        Intake/Output Summary (Last 24 hours) at 03/11/2021 1434 Last data filed at 03/11/2021 0945 Gross per 24 hour  Intake 1157.39 ml  Output 735 ml  Net 422.39 ml    Filed Weights   03/07/2021 1104 02/24/2021 1611  Weight: 59 kg 58.3 kg    Examination:  General.  Frail and lethargic elderly man, appears little agitated.  Not following any commands Pulmonary.  Bilateral dry crackles, mildly increased work of breathing CV.  Regular rate and rhythm, no JVD, rub or murmur. Abdomen.  Soft, nontender, nondistended, BS positive. CNS.  Lethargic, not following any commands Extremities.  No edema, no cyanosis, pulses intact and symmetrical. Psychiatry.  Judgment and insight appears impaired  DVT prophylaxis: Lovenox Code Status: DNR Family Communication: Discussed with son at bedside Disposition Plan:  Status is: Inpatient  Remains inpatient appropriate because: Severity of illness   Level of care: Stepdown  All the records are reviewed and case discussed with Care Management/Social Worker. Management plans discussed with the patient, nursing and they are in agreement.  Consultants:  General surgery PCCM  Procedures:  Antimicrobials:  Cefepime Vancomycin Flagyl  Data Reviewed: I have personally reviewed following labs and imaging studies  CBC: Recent Labs  Lab 03/24/2021 1126 03/10/21 0752 03/11/21 0251  WBC 17.1* 13.2* 10.5  NEUTROABS 15.4* 11.4* 9.6*  HGB 13.2 11.6* 11.2*  HCT 39.3 35.2* 33.8*  MCV 92.9 93.6 93.6  PLT 213 197 128    Basic Metabolic Panel: Recent Labs  Lab 03/18/2021 1126 03/10/21 0752 03/11/21 0251 03/11/21 1015  NA 135 141 141  --   K 3.5 3.6 3.4*  --   CL 96* 103  104  --   CO2 24 21* 23  --   GLUCOSE 92 134* 119*  --   BUN 38* 38* 41*  --   CREATININE 1.43* 1.28* 1.26*  --   CALCIUM 8.5* 7.6* 7.7*  --   MG  --  2.1  --  2.0  PHOS  --  5.1*  --  2.3*    GFR: Estimated Creatinine Clearance: 42.4 mL/min (A) (by C-G formula based on SCr of 1.26 mg/dL (H)). Liver Function Tests: Recent Labs  Lab 02/26/2021 1126 03/10/21 0752 03/11/21 0251  AST 332* 196* 132*  ALT 72* 66* 63*  ALKPHOS 82 68 66  BILITOT 1.3* 1.5* 1.3*  PROT 6.6 5.8* 5.7*  ALBUMIN 3.0* 2.4* 2.4*    No results for input(s): LIPASE, AMYLASE in the last 168 hours. Recent Labs  Lab 03/20/2021 1824  AMMONIA 20    Coagulation Profile: Recent Labs  Lab 03/23/2021 1126  INR 1.0    Cardiac Enzymes: No results for input(s): CKTOTAL, CKMB, CKMBINDEX, TROPONINI in the last 168 hours. BNP (last 3 results) No results for input(s): PROBNP in the last 8760 hours. HbA1C: No results for input(s): HGBA1C in the last 72 hours. CBG: Recent Labs  Lab 03/21/2021 1604  GLUCAP 86    Lipid Profile: No results for input(s): CHOL, HDL, LDLCALC, TRIG, CHOLHDL, LDLDIRECT in the last 72 hours. Thyroid Function Tests: No results for input(s): TSH, T4TOTAL, FREET4, T3FREE, THYROIDAB in the last 72 hours. Anemia Panel: Recent Labs    03/10/21 0752 03/11/21 0251  FERRITIN 395* 345*    Sepsis Labs: Recent Labs  Lab 03/06/2021 1533 03/21/2021 1612 03/10/21 1041 03/11/21 0251 03/11/21 1015  PROCALCITON  --  5.97  --   --   --   LATICACIDVEN 2.3*  --  1.6 4.9* 1.9     Recent Results (from the past 240 hour(s))  Blood Culture (routine x 2)     Status: None (Preliminary result)   Collection Time: 03/11/2021 11:26 AM   Specimen: BLOOD RIGHT HAND  Result Value Ref Range Status   Specimen Description BLOOD RIGHT HAND  Final   Special Requests   Final    BOTTLES DRAWN AEROBIC AND ANAEROBIC Blood Culture results may not be optimal due to an inadequate volume of blood received in culture  bottles   Culture   Final    NO GROWTH 2 DAYS Performed at [facility], [address], [place], [place] [address]    Report Status PENDING  Incomplete  Resp Panel by RT-PCR (Flu A&B, Covid) Nasopharyngeal Swab     Status: Abnormal   Collection Time: 03/17/2021  4:07 PM   Specimen: Nasopharyngeal Swab; Nasopharyngeal(NP) swabs in vial transport medium  Result Value Ref Range Status   SARS Coronavirus 2 by RT PCR POSITIVE (A) NEGATIVE Final    Comment: (NOTE) SARS-CoV-2 target nucleic acids are DETECTED.  The SARS-CoV-2 RNA is generally detectable in upper respiratory specimens during the acute phase of infection. Positive results are indicative of the presence of the identified virus, but do not rule out bacterial infection or  co-infection with other pathogens not detected by the test. Clinical correlation with patient history and other diagnostic information is necessary to determine patient infection status. The expected result is Negative.  Fact Sheet for Patients: EntrepreneurPulse.com.au  Fact Sheet for Healthcare Providers: IncredibleEmployment.be  This test is not yet approved or cleared by the [place] FDA and  has been authorized for detection and/or diagnosis of SARS-CoV-2 by FDA under an Emergency Use Authorization (EUA).  This EUA will remain in effect (meaning this test can be used) for the duration of  the COVID-19 declaration under Section 564(b)(1) of the A ct, 21 U.S.C. section 360bbb-3(b)(1), unless the authorization is terminated or revoked sooner.     Influenza A by PCR NEGATIVE NEGATIVE Final   Influenza B by PCR NEGATIVE NEGATIVE Final    Comment: (NOTE) The Xpert Xpress SARS-CoV-2/FLU/RSV plus assay is intended as an aid in the diagnosis of influenza from Nasopharyngeal swab specimens and should not be used as a sole basis for treatment. Nasal washings and aspirates are unacceptable for Xpert Xpress  SARS-CoV-2/FLU/RSV testing.  Fact Sheet for Patients: EntrepreneurPulse.com.au  Fact Sheet for Healthcare Providers: IncredibleEmployment.be  This test is not yet approved or cleared by the [place] FDA and has been authorized for detection and/or diagnosis of SARS-CoV-2 by FDA under an Emergency Use Authorization (EUA). This EUA will remain in effect (meaning this test can be used) for the duration of the COVID-19 declaration under Section 564(b)(1) of the Act, 21 U.S.C. section 360bbb-3(b)(1), unless the authorization is terminated or revoked.  Performed at [facility], [address] [place], [place] [address]   Blood Culture (routine x 2)     Status: None (Preliminary result)   Collection Time: 03/22/2021  4:10 PM   Specimen: BLOOD  Result Value Ref Range Status   Specimen Description BLOOD [employer]  Final   Special Requests BOTTLES DRAWN AEROBIC AND ANAEROBIC BCAV  Final   Culture   Final    NO GROWTH 2 DAYS Performed at [facility], [address], [place], [place] [address]    Report Status PENDING  Incomplete      Radiology Studies: CT HEAD WO CONTRAST (5MM)  Result Date: 02/24/2021 CLINICAL DATA:  Mental status change EXAM: CT HEAD WITHOUT CONTRAST TECHNIQUE: Contiguous axial images were obtained from the base of the skull through the vertex without intravenous contrast. COMPARISON:  None. FINDINGS: Brain: Hypodensity in the right anterior frontal lobe (series 2, image 19), of indeterminate acuity but favored to be subacute to chronic. No evidence of hemorrhage, cerebral edema, mass, mass effect, or midline shift. No hydrocephalus or extra-axial fluid collection. Periventricular white matter changes, likely the sequela of chronic small vessel ischemic disease. Lacunar infarct in the left basal ganglia. Vascular: No hyperdense vessel. Skull: Normal. Negative for fracture or focal lesion. Sinuses/Orbits: No acute  finding. Status post left lens replacement. Other: The mastoid air cells are well aerated. IMPRESSION: IMPRESSION Hypodensity in the right anterior frontal lobe, of indeterminate acuity but favored to be subacute to chronic. Electronically Signed   By: Merilyn Baba M.D.   On: 03/24/2021 22:29   CT ABDOMEN PELVIS W CONTRAST  Result Date: 03/11/2021 CLINICAL DATA:  Abdominal pain, acute, nonlocalized. COVID pneumonia. EXAM: CT ABDOMEN AND PELVIS WITH CONTRAST TECHNIQUE: Multidetector CT imaging of the abdomen and pelvis was performed using the standard protocol following bolus administration of intravenous contrast. CONTRAST:  130m OMNIPAQUE IOHEXOL 300 MG/ML  SOLN COMPARISON:  03/08/2021 FINDINGS: Lower chest: There  is extensive bibasilar ground-glass pulmonary infiltrate and focal consolidation within the right lower lobe in keeping with multifocal pneumonic infiltrate. No pleural effusion. Extensive multi-vessel coronary artery calcification. Global cardiac size within normal limits. Moderate hiatal hernia. Hepatobiliary: No focal liver abnormality is seen. No gallstones, gallbladder wall thickening, or biliary dilatation. High attenuation material within the gallbladder lumen likely reflects vicarious excretion of contrast. Pancreas: Unremarkable Spleen: Unremarkable Adrenals/Urinary Tract: The adrenal glands are unremarkable. The kidneys are normal in size and position. Multiple simple cortical cyst noted within the left kidney. There is progressive perinephric soft tissue infiltration surrounding the upper pole the right kidney of unclear significance. This may represent mild progressive hemorrhage or urine in the setting of trauma. No significant subcapsular hematoma, however, identified. No hydronephrosis. No intrarenal or ureteral calculi. Foley catheter balloon is seen within a decompressed bladder lumen. Stomach/Bowel: Diastasis of the rectus abdominus musculature and laxity of the anterior abdominal  wall again noted with marked attenuation of the overlying subcutaneous fat. The stomach, small bowel, and large bowel are unremarkable. No free intraperitoneal gas or fluid. Vascular/Lymphatic: Aortic atherosclerosis. No enlarged abdominal or pelvic lymph nodes. Reproductive: Prostate is unremarkable. Other: None Musculoskeletal: L4-S1 lumbar fusion with instrumentation is again noted. Advanced degenerative changes are noted throughout the visualized thoracolumbar spine. Remote T9 and T11 compression deformities are again noted. 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 No acute bone abnormality. IMPRESSION: Extensive bibasilar pulmonary infiltrate, likely infectious or inflammatory, and in keeping with the given history of acute COVID-19 pneumonia. Extensive multi-vessel coronary artery calcification. Slight interval increase in mild perinephric infiltrative fluid surrounding the upper pole the right kidney. Correlation for history of local trauma may be helpful as this may represent minimal perinephric hemorrhage or urine. Moderate hiatal hernia. Stable remote compression deformities of T9 and T11. 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 Electronically Signed   By: Fidela Salisbury M.D.   On: 03/11/2021 02:57   US Venous Img Lower Bilateral (DVT)  Result Date: 03/10/2021 CLINICAL DATA:  75 year old male with lower extremity swelling. EXAM: BILATERAL LOWER EXTREMITY VENOUS DOPPLER ULTRASOUND TECHNIQUE: Gray-scale sonography with graded compression, as well as color Doppler and duplex ultrasound were performed to evaluate the lower extremity deep venous systems from the level of the common femoral vein and including the common femoral, femoral, profunda femoral, popliteal and calf veins including the posterior tibial, peroneal and gastrocnemius veins when visible. The superficial great saphenous vein was also interrogated. Spectral Doppler was utilized to evaluate flow at rest  and with distal augmentation maneuvers in the common femoral, femoral and popliteal veins. COMPARISON:  None. FINDINGS: RIGHT LOWER EXTREMITY Common Femoral Vein: No evidence of thrombus. Normal compressibility, respiratory phasicity and response to augmentation. Saphenofemoral Junction: No evidence of thrombus. Normal compressibility and flow on color Doppler imaging. Profunda Femoral Vein: No evidence of thrombus. Normal compressibility and flow on color Doppler imaging. Femoral Vein: No evidence of thrombus. Normal compressibility, respiratory phasicity and response to augmentation. Popliteal Vein: No evidence of thrombus. Normal compressibility, respiratory phasicity and response to augmentation. Calf Veins: Limited evaluation, however no evidence of thrombus. Other Findings:  None. LEFT LOWER EXTREMITY Common Femoral Vein: No evidence of thrombus. Normal compressibility, respiratory phasicity and response to augmentation. Saphenofemoral Junction: No evidence of thrombus. Normal compressibility and flow on color Doppler imaging. Profunda Femoral Vein: No evidence of thrombus. Normal compressibility and flow on color Doppler imaging. Femoral Vein: No evidence of thrombus. Normal compressibility, respiratory phasicity and response  to augmentation. Popliteal Vein: No evidence of thrombus. Normal compressibility, respiratory phasicity and response to augmentation. Calf Veins: Limited evaluation, however no evidence of thrombus. Other Findings:  None. IMPRESSION: No evidence of bilateral lower extremity deep vein thrombosis. Limited evaluation of the calf veins. Ruthann Cancer, MD [facility] Electronically Signed   By: Ruthann Cancer M.D.   On: 03/10/2021 09:23    Scheduled Meds:  bisacodyl  10 mg Rectal Once   Chlorhexidine Gluconate Cloth  6 each Topical Daily   enoxaparin (LOVENOX) injection  40 mg Subcutaneous Q24H   ipratropium-albuterol  3 mL  Nebulization Q6H   methylPREDNISolone (SOLU-MEDROL) injection  40 mg Intravenous Q12H   potassium chloride  20 mEq Oral Once   senna-docusate  1 tablet Oral BID   Continuous Infusions:  sodium chloride Stopped (03/11/21 0445)   ceFEPime (MAXIPIME) IV 2 g (03/11/21 1217)   dexmedetomidine (PRECEDEX) IV infusion 1.2 mcg/kg/hr (03/11/21 0945)   metronidazole 500 mg (03/11/21 0943)   norepinephrine (LEVOPHED) Adult infusion Stopped (03/08/2021 2200)   vancomycin 1,000 mg (03/11/21 1357)     LOS: 2 days   Time spent: 43 minutes. More than 50% of the time was spent in counseling/coordination of care  Lorella Nimrod, MD [facility]  If 7PM-7AM, please contact night-coverage [email or web address]  03/11/2021, 2:34 PM   This record has been created using Systems analyst. Errors have been sought and corrected,but may not always be located. Such creation errors do not reflect on the standard of care.

## 2021-03-11 NOTE — Progress Notes (Signed)
Dr. Hal Hope at bedside assessing the patient.

## 2021-03-11 NOTE — Progress Notes (Signed)
Lactic 4.9 per [facility] in lab. Relayed message to primary nurse Baldo Ash, RN at 03:37. Erling Conte, RN

## 2021-03-11 NOTE — Progress Notes (Signed)
Lactic 4.9 and D dimer 8.83. Notified Dr. Hal Hope and received orders for it.

## 2021-03-11 NOTE — Progress Notes (Signed)
New orders received  as seen in epic.

## 2021-03-11 NOTE — Progress Notes (Signed)
[facility] Day(s): 2.   Interval History: Patient seen and examined, he is altered at this time speaking gibberish.  He is desaturating into the 70s on high flow nasal cannula.  Lactic acid overnight measured 4.9 and repeat CT abdomen and pelvis demonstrated no acute process with ventral hernias and bowel, however did demonstrate increased perinephric infiltrative fluid.  Review of Systems:  Unable to obtain secondary to patient mentation  Vital signs in last 24 hours: [min-max] current  Temp:  [97.8 F (36.6 C)-98.6 F (37 C)] 98.6 F (37 C) (12/18 0446) Pulse Rate:  [67-128] 98 (12/18 0600) Resp:  [18-35] 33 (12/18 0600) BP: (98-144)/(53-110) 121/84 (12/18 0600) SpO2:  [82 %-99 %] 91 % (12/18 0600) FiO2 (%):  [70 %-100 %] 100 % (12/18 0321)     Height: 5\' 8"  (172.7 cm) Weight: 58.3 kg BMI (Calculated): 19.55   Intake/Output last 2 shifts:  12/17 0701 - 12/18 0700 In: 1157.4 [I.V.:394.9; IV Piggyback:662.5] Out: 2070 [Urine:2070]   Physical Exam:  Constitutional: Altered, confused Respiratory: Labored breathing on high flow nasal cannula Cardiovascular: regular rate and sinus rhythm  Gastrointestinal: soft, non-tender, and non-distended; ventral hernias remain reducible; skin opening overlying inferior ventral hernia with underlying fat, no significant change Musculoskeletal: Rheumatic changes to bilateral hands   Labs:  CBC Latest Ref Rng & Units 03/11/2021 03/10/2021 03/03/2021  WBC 4.0 - 10.5 K/uL 10.5 13.2(H) 17.1(H)  Hemoglobin 13.0 - 17.0 g/dL 11.2(L) 11.6(L) 13.2  Hematocrit 39.0 - 52.0 % 33.8(L) 35.2(L) 39.3  Platelets 150 - 400 K/uL 160 197 213   CMP Latest Ref Rng & Units 03/11/2021 03/10/2021 03/17/2021  Glucose 70 - 99 mg/dL 119(H) 134(H) 92  BUN 8 - 23 mg/dL 41(H) 38(H) 38(H)  Creatinine 0.61 - 1.24 mg/dL 1.26(H) 1.28(H) 1.43(H)  Sodium 135 - 145 mmol/L 141 141 135  Potassium 3.5 - 5.1 mmol/L 3.4(L) 3.6  3.5  Chloride 98 - 111 mmol/L 104 103 96(L)  CO2 22 - 32 mmol/L 23 21(L) 24  Calcium 8.9 - 10.3 mg/dL 7.7(L) 7.6(L) 8.5(L)  Total Protein 6.5 - 8.1 g/dL 5.7(L) 5.8(L) 6.6  Total Bilirubin 0.3 - 1.2 mg/dL 1.3(H) 1.5(H) 1.3(H)  Alkaline Phos 38 - 126 U/L 66 68 82  AST 15 - 41 U/L 132(H) 196(H) 332(H)  ALT 0 - 44 U/L 63(H) 66(H) 72(H)    Imaging studies: No new pertinent imaging studies   Assessment/Plan:  75 y.o. male who was admitted with shortness of breath and difficulty breathing and known to be COVID-positive.  General surgery following for ventral hernia with overlying erythema and skin defect, complicated by pertinent comorbidities including rheumatoid arthritis, COVID-positive status   -Given patient's deteriorating respiratory status, will continue with conservative management of skin defect overlying ventral hernia  -Continue daily dressing changes with Xeroform or Vaseline gauze overlying skin defect  -If this area begins to leak fluid, recommend placement of ostomy appliance to prevent excoriation to her surrounding skin  -Antibiotics per primary team  -Care per primary and ICU teams  All of the above findings and recommendations were discussed with the patient's family, and the medical team, and all of patient's family's questions were answered to their expressed satisfaction.  -- Graciella Freer, DO

## 2021-03-11 NOTE — Progress Notes (Signed)
The patient was moving his hands eradically and hit his elbow on the side rail sustaining a skin tear to the right lateral elbow. Small amount of bright red blood observed. Dressing aplied. Son at bedside aware . Dr. Hal Hope notified without any new order. Will continue to monitor.

## 2021-03-11 NOTE — Progress Notes (Addendum)
0800 Discussed patients poor prognosis with son..Suggested comfort care. Son was not accepting to the idea. 0930 Son left hospital to check on mother. 1000 Oxygen saturations up and down. 1100 Dr Reesa Chew in to assess patient. Verbal order for addition of 100% NRB ,placed on patient. Oxygen saturation increased to 91% with both on. Dr. Reesa Chew stated she would contact family. 1130 Patient very calm without sedation. Oxygen saturations in low 60% 1200 Granddaughter in to sit with patient. Son is next of kin for medical decisions.Son,Charly Jr.,contacted per Dr Lanney Gins concerning care. 52 Dr. Lanney Gins in to assess patient. Granddaughter ask for additional sedation. 1256 Additional Morphine given as requested without success in decreasing patients pain or agitation. 1400 Discussed sedation and haldol schedule dis with granddaughter and Dr. Lanney Gins. Morphine discontinued. Dilaudid increased to 1 mg. Haldol increased to 5 mg. but interval for administration not changed. Son is still adamant that he is not ready for comfort care. 1700 Oxygen saturations have been from 96% down to 48%. Patient remains confused,crying out in pain and agitated. See MAR for adminstration times.Patient has remained in pain all day with a few minutes of calm. Dr. Lanney Gins aware but unable to address pain without son's consent. Granddaughter still present and trying to soothe patient. Ice and water sponge given for dry mouth.Patient still crying out in pain.

## 2021-03-11 NOTE — Progress Notes (Signed)
Morphine 2 mg IV x one dose received from [facility] NP

## 2021-03-11 NOTE — Significant Event (Signed)
Patient's nurse notified me that patient has been getting increasingly agitated despite being on Precedex and getting Haldol.  On exam at bedside patient is oriented to his name and states that he is increasingly having pain on the abdominal area.  On exam patient blood pressure is 117/80 pulse 90/min respiration 24/min temperature 97.4.  Chest has bilateral air entry and abdomen a small hernia with surrounding inflammatory changes.  Labs and medications and notes.  Given the worsening pain and the agitation CT abdomen pelvis was ordered with contrast which shows features concerning for perinephric stranding concerning for perinephric hemorrhage or urine.  Discussed with Dr. Erlene Quan on-call urologist who at this time feels patient's symptoms are likely from inflammatory process likely infection advised getting UA and continue antibiotics.  If pain and WBC worsens then may rescan to make sure there is no abscess following.  Patient lactic acid came elevated at around 4.9.  I am holding the patient's diuretic and gently hydrating cautiously given the history of CHF.  Patient's son was at the bedside.  Patient's nurse updated.   Travis Haynes

## 2021-03-12 ENCOUNTER — Other Ambulatory Visit: Payer: Medicare Other

## 2021-03-12 ENCOUNTER — Inpatient Hospital Stay: Payer: Medicare Other

## 2021-03-12 DIAGNOSIS — Z66 Do not resuscitate: Secondary | ICD-10-CM

## 2021-03-12 DIAGNOSIS — R0603 Acute respiratory distress: Secondary | ICD-10-CM

## 2021-03-12 DIAGNOSIS — Z515 Encounter for palliative care: Secondary | ICD-10-CM

## 2021-03-12 DIAGNOSIS — Z8572 Personal history of non-Hodgkin lymphomas: Secondary | ICD-10-CM

## 2021-03-12 LAB — COMPREHENSIVE METABOLIC PANEL
ALT: 50 U/L — ABNORMAL HIGH (ref 0–44)
AST: 59 U/L — ABNORMAL HIGH (ref 15–41)
Albumin: 2.1 g/dL — ABNORMAL LOW (ref 3.5–5.0)
Alkaline Phosphatase: 64 U/L (ref 38–126)
Anion gap: 9 (ref 5–15)
BUN: 36 mg/dL — ABNORMAL HIGH (ref 8–23)
CO2: 25 mmol/L (ref 22–32)
Calcium: 7.8 mg/dL — ABNORMAL LOW (ref 8.9–10.3)
Chloride: 110 mmol/L (ref 98–111)
Creatinine, Ser: 0.81 mg/dL (ref 0.61–1.24)
GFR, Estimated: >60 mL/min (ref >60)
Glucose, Bld: 153 mg/dL — ABNORMAL HIGH (ref 70–99)
Potassium: 3.3 mmol/L — ABNORMAL LOW (ref 3.5–5.1)
Sodium: 144 mmol/L (ref 135–145)
Total Bilirubin: 1.5 mg/dL — ABNORMAL HIGH (ref 0.3–1.2)
Total Protein: 5.4 g/dL — ABNORMAL LOW (ref 6.5–8.1)

## 2021-03-12 LAB — CBC WITH DIFFERENTIAL/PLATELET
Abs Immature Granulocytes: 0.11 10*3/uL — ABNORMAL HIGH (ref 0.00–0.07)
Basophils Absolute: 0 10*3/uL (ref 0.0–0.1)
Basophils Relative: 0 %
Eosinophils Absolute: 0 10*3/uL (ref 0.0–0.5)
Eosinophils Relative: 0 %
HCT: 33.3 % — ABNORMAL LOW (ref 39.0–52.0)
Hemoglobin: 11.3 g/dL — ABNORMAL LOW (ref 13.0–17.0)
Immature Granulocytes: 1 %
Lymphocytes Relative: 4 %
Lymphs Abs: 0.4 10*3/uL — ABNORMAL LOW (ref 0.7–4.0)
MCH: 32.1 pg (ref 26.0–34.0)
MCHC: 33.9 g/dL (ref 30.0–36.0)
MCV: 94.6 fL (ref 80.0–100.0)
Monocytes Absolute: 0.6 10*3/uL (ref 0.1–1.0)
Monocytes Relative: 6 %
Neutro Abs: 7.8 10*3/uL — ABNORMAL HIGH (ref 1.7–7.7)
Neutrophils Relative %: 89 %
Platelets: 126 10*3/uL — ABNORMAL LOW (ref 150–400)
RBC: 3.52 MIL/uL — ABNORMAL LOW (ref 4.22–5.81)
RDW: 14.6 % (ref 11.5–15.5)
WBC: 8.9 10*3/uL (ref 4.0–10.5)
nRBC: 0 % (ref 0.0–0.2)

## 2021-03-12 LAB — C-REACTIVE PROTEIN: CRP: 17 mg/dL — ABNORMAL HIGH (ref <1.0)

## 2021-03-12 LAB — D-DIMER, QUANTITATIVE: D-Dimer, Quant: 4.38 ug/mL-FEU — ABNORMAL HIGH (ref 0.00–0.50)

## 2021-03-12 LAB — FERRITIN: Ferritin: 321 ng/mL (ref 24–336)

## 2021-03-12 MED ORDER — HYDROMORPHONE HCL 1 MG/ML IJ SOLN
1.0000 mg | INTRAMUSCULAR | Status: AC
  Administered 2021-03-12: 12:00:00 1 mg via INTRAVENOUS
  Filled 2021-03-12: qty 1

## 2021-03-12 MED ORDER — ENOXAPARIN SODIUM 60 MG/0.6ML IJ SOSY
1.0000 mg/kg | PREFILLED_SYRINGE | Freq: Two times a day (BID) | INTRAMUSCULAR | Status: DC
  Filled 2021-03-12 (×2): qty 0.57

## 2021-03-12 MED ORDER — HYDROMORPHONE HCL 1 MG/ML IJ SOLN
2.0000 mg | INTRAMUSCULAR | Status: DC | PRN
  Administered 2021-03-12 (×2): 1 mg via INTRAVENOUS
  Filled 2021-03-12: qty 2

## 2021-03-12 MED ORDER — HYDROMORPHONE HCL 1 MG/ML IJ SOLN
INTRAMUSCULAR | Status: AC
  Filled 2021-03-12: qty 2

## 2021-03-12 MED ORDER — ONDANSETRON HCL 4 MG/2ML IJ SOLN: 4.0000 mg | Freq: Four times a day (QID) | INTRAMUSCULAR | Status: DC | PRN

## 2021-03-12 MED ORDER — GLYCOPYRROLATE 1 MG PO TABS: 1.0000 mg | ORAL_TABLET | ORAL | Status: DC | PRN

## 2021-03-12 MED ORDER — HYDROMORPHONE HCL 1 MG/ML IJ SOLN
2.0000 mg | INTRAMUSCULAR | Status: AC
  Administered 2021-03-12: 16:00:00 2 mg via INTRAVENOUS
  Filled 2021-03-12: qty 2

## 2021-03-12 MED ORDER — GLYCOPYRROLATE 0.2 MG/ML IJ SOLN
0.2000 mg | INTRAMUSCULAR | Status: DC | PRN
  Administered 2021-03-12 – 2021-03-13 (×3): 0.2 mg via INTRAVENOUS
  Filled 2021-03-12 (×6): qty 1

## 2021-03-12 MED ORDER — HYDROMORPHONE BOLUS VIA INFUSION
0.5000 mg | INTRAVENOUS | Status: DC | PRN
  Administered 2021-03-13 (×6): 0.5 mg via INTRAVENOUS
  Filled 2021-03-12: qty 1

## 2021-03-12 MED ORDER — SODIUM CHLORIDE 0.9 % IV SOLN
0.5000 mg/h | INTRAVENOUS | Status: DC
  Administered 2021-03-12: 17:00:00 0.5 mg/h via INTRAVENOUS
  Administered 2021-03-13: 03:00:00 4 mg/h via INTRAVENOUS
  Filled 2021-03-12 (×2): qty 5

## 2021-03-12 MED ORDER — GLYCOPYRROLATE 0.2 MG/ML IJ SOLN: 0.2000 mg | INTRAMUSCULAR | Status: DC | PRN

## 2021-03-12 MED ORDER — ONDANSETRON 4 MG PO TBDP: 4.0000 mg | ORAL_TABLET | Freq: Four times a day (QID) | ORAL | Status: DC | PRN

## 2021-03-12 MED ORDER — HALOPERIDOL LACTATE 5 MG/ML IJ SOLN: 5.0000 mg | Freq: Four times a day (QID) | INTRAMUSCULAR | Status: DC | PRN

## 2021-03-12 MED ORDER — HALOPERIDOL LACTATE 5 MG/ML IJ SOLN
5.0000 mg | INTRAMUSCULAR | Status: DC | PRN
  Administered 2021-03-12 – 2021-03-13 (×4): 5 mg via INTRAVENOUS
  Filled 2021-03-12 (×5): qty 1

## 2021-03-12 MED ORDER — PIPERACILLIN-TAZOBACTAM 3.375 G IVPB
3.3750 g | Freq: Three times a day (TID) | INTRAVENOUS | Status: DC
  Filled 2021-03-12: qty 50

## 2021-03-12 NOTE — Progress Notes (Signed)
PROGRESS NOTE    Travis Haynes  ZOX:[id number] DOB: 1945-07-24 DOA: 02/27/2021 PCP: Venita Lick, NP   Brief Narrative: Taken from H&P and prior notes.  Travis Haynes is a 75 y.o. male with medical history significant of hypertension, hyperlipidemia, GERD, rheumatoid arthritis, CKD stage IIIa, CAD, non-Hodgkin lymphoma, CHF with EF of 40-45%, TV (s/p of treatment 2015), GERD, who presents with altered mental status, shortness breath, abdominal pain.   Per his son at bedside, patient had positive COVID test on 12/15, and was noted to become confused.  Normally patient is alert oriented x3.  He was also found to have increased edema and erythema of a chronic ventral hernia, they have noticed that it has been protruded recently and becoming more painful and erythematous.  Patient was started on Keflex as an outpatient with no improvement. On arrival he had leukocytosis, lactic acidosis, AKI, transaminitis, tachycardia and tachypnea.  He was desaturating to 88%, started requiring higher level of oxygen, currently on 55 L of heated high flow at 100% FiO2. Family is refusing remdesivir as they were very concerned about his liver and kidneys. PCCM was consulted due to concern of persistent hypotension, initially vasopressor were ordered but never started.  Blood pressure improved with IV fluid only.  Patient met sepsis criteria with tachycardia, tachypnea, leukocytosis, lactic acidosis, AKI, transaminitis and hypotension.  Septic shock ruled out as he never required pressors.  Most likely secondary to infection at ventral hernia site, and COVID-19 pneumonia.  General surgery was also consulted.  CT abdomen with no evidence of bowel obstruction.  See the full report.  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  General surgery is recommending conservative management with antibiotics at this time.  Chest x-ray with bilateral lung densities, right greater than left,  suggestive of bilateral pneumonia.  Patient was started on empiric antibiotics with cefepime, Flagyl and vancomycin per sepsis protocol.  Patient was very agitated overnight, keeps repeating that I am dying.  Received Haldol overnight and started on low-dose Precedex infusion by PCCM in the morning.  Patient is high risk for deterioration and death, appropriate for comfort measures only.  Family is little reluctant and need few more days to decide.  Patient might not survive.  12/18: Patient was desaturating on maximum setting of heated high flow to 60s.  Appears little agitated, not following any commands.  PCCM started the discussion about comfort measures but family is not ready yet.  We added nonrebreather with some improvement in oxygenation. CT abdomen was obtained overnight due to worsening abdominal pain which shows perinephric fluid collection, urology was consulted and they were recommending observation for now. His diuretic was held by nighttime provider due to worsening lactic acidosis.  12/19: Patient continued to desaturate in 50s on maximum setting of heated high flow, at 60 L with 100% FiO2.  Unable to obtain VQ scan due to respiratory status, unable to lay flat and agitation. Palliative care is recommending to start Dilaudid infusion, family is not ready to proceed with comfort measures only but wants to control pain.  We have to be careful about his respiratory drive. Son wants few weeks before deciding about comfort measures-doubt he will survive that long. Starting therapeutic doses of Lovenox. Antibiotic switched to Zosyn by PCCM.  Subjective: Patient seems very uncomfortable and agitated.  Not following any commands.  No meaningful talk. Desaturating in 50s despite being on maximum setting of heated high flow.  Unable to tolerate nonrebreather  with it.  Granddaughter at bedside  Assessment & Plan:   Principal Problem:   Acute respiratory disease due to COVID-19  virus Active Problems:   Essential hypertension   Hyperlipidemia   Rheumatoid arthritis ([place])   History of non-Hodgkin's lymphoma   CAD (coronary artery disease)   Chronic combined systolic and diastolic CHF (congestive heart failure) (HCC)   Acute respiratory failure with hypoxia (HCC)   Severe sepsis (HCC)   Ventral hernia   Abdominal wall cellulitis   Abnormal LFTs   Acute metabolic encephalopathy   CKD (chronic kidney disease), stage IIIa  Acute respiratory failure with hypoxia secondary to COVID-19 pneumonia.  Family is reluctant for remdesivir due to his liver and kidney abnormalities. Currently requiring higher level of oxygen.  Patient is DNR and DNI.  Elevated inflammatory markers. D-dimer above 14>>8.83>>4.38, lower extremity venous Doppler was negative for DVT.  Unable to obtain VQ scan due to respiratory status, unable to lay flat and agitation.  Desaturating on maximum setting of heated high flow.  Worsening lactic acidosis.  Unable to tolerate nonrebreather. Palliative care is suggesting adding Dilaudid infusion after talking with wife.  Son does not want to proceed with comfort measures only at this time. -Continue with Solu-Medrol -Continue with supplemental oxygen. -Starting on therapeutic doses of Lovenox -Continue with supportive care -Patient is very high risk for death.  Severe sepsis secondary to abdominal wall cellulitis and COVID-19 pneumonia.  Patient met sepsis criteria with leukocytosis, tachycardia and tachypnea with endorgan damage causing lactic acidosis, AKI and transaminitis.  Blood cultures negative in 2 days. Worsening lactic acidosis secondary to hypoxia most likely. -Antibiotics switched to Zosyn by PCCM. -Not a surgical candidate with current respiratory status.  Agitation/delirium.  Patient requiring multiple doses of Haldol overnight. -Started on low-dose Precedex by PCCM -We will get benefit from Dilaudid infusion-little difficult titration as  patient is still not comfort measures only.  Essential hypertension.  Blood pressure currently within goal.  He was hypotensive before. -Keep holding home antihypertensives -Monitor blood pressure  Hyperlipidemia. -Holding home dose of Lipitor due to transaminitis  Rheumatoid arthritis. -Holding home sulfasalazine  History of non-Hodgkin's lymphoma --Follow-up oncology   CAD (coronary artery disease) -Hold Lipitor due to abnormal liver function and AMS, also hold ASA until mental status improves  AKI with CKD stage IIIa.  Resolved.  Creatinine around baseline now -Monitor renal function -Avoid nephrotoxins  Chronic combined diastolic and systolic CHF: 2D echo on 0/04/5425 showed EF of 40-45% with grade 1 diastolic dysfunction.   -Holding IV Lasix due to worsening lactic acidosis. -Daily BMP and weight -Strict intake and output   Objective: Vitals:   03/12/21 1000 03/12/21 1100 03/12/21 1200 03/12/21 1300  BP: 118/79  130/70   Pulse: 84 68 69 68  Resp: (!) _0 Temp:      TempSrc:      SpO2: (!) 86% 96% 97% 93%  Weight:      Height:        Intake/Output Summary (Last 24 hours) at 03/12/2021 1323 Last data filed at 03/12/2021 0500 Gross per 24 hour  Intake 894.46 ml  Output 700 ml  Net 194.46 ml    Filed Weights   03/05/2021 1104 03/20/2021 1611  Weight: 59 kg 58.3 kg    Examination:  General.  Appears agitated and uncomfortable, not following any commands Pulmonary.  Bilateral dry crackles with increased work of breathing CV.  Regular rate and rhythm, no JVD, rub or murmur. Abdomen.  Soft, nontender, nondistended, BS positive. CNS.  Awake and agitated, not following any commands Extremities.  No edema, no cyanosis, pulses intact and symmetrical. Psychiatry.  Judgment and insight appears impaired.  DVT prophylaxis: Lovenox Code Status: DNR Family Communication: Discussed with granddaughter at bedside Disposition Plan:  Status is:  Inpatient  Remains inpatient appropriate because: Severity of illness   Level of care: Stepdown  All the records are reviewed and case discussed with Care Management/Social Worker. Management plans discussed with the patient, nursing and they are in agreement.  Consultants:  General surgery PCCM  Procedures:  Antimicrobials:  Zosyn  Data Reviewed: I have personally reviewed following labs and imaging studies  CBC: Recent Labs  Lab 03/05/2021 1126 03/10/21 0752 03/11/21 0251 03/12/21 0300  WBC 17.1* 13.2* 10.5 8.9  NEUTROABS 15.4* 11.4* 9.6* 7.8*  HGB 13.2 11.6* 11.2* 11.3*  HCT 39.3 35.2* 33.8* 33.3*  MCV 92.9 93.6 93.6 94.6  PLT 213 197 160 126*    Basic Metabolic Panel: Recent Labs  Lab 03/11/2021 1126 03/10/21 0752 03/11/21 0251 03/11/21 1015 03/12/21 0300  NA 135 141 141  --  144  K 3.5 3.6 3.4*  --  3.3*  CL 96* 103 104  --  110  CO2 24 21* 23  --  25  GLUCOSE 92 134* 119*  --  153*  BUN 38* 38* 41*  --  36*  CREATININE 1.43* 1.28* 1.26*  --  0.81  CALCIUM 8.5* 7.6* 7.7*  --  7.8*  MG  --  2.1  --  2.0  --   PHOS  --  5.1*  --  2.3*  --     GFR: Estimated Creatinine Clearance: 66 mL/min (by C-G formula based on SCr of 0.81 mg/dL). Liver Function Tests: Recent Labs  Lab 03/07/2021 1126 03/10/21 0752 03/11/21 0251 03/12/21 0300  AST 332* 196* 132* 59*  ALT 72* 66* 63* 50*  ALKPHOS 82 68 66 64  BILITOT 1.3* 1.5* 1.3* 1.5*  PROT 6.6 5.8* 5.7* 5.4*  ALBUMIN 3.0* 2.4* 2.4* 2.1*    No results for input(s): LIPASE, AMYLASE in the last 168 hours. Recent Labs  Lab 02/26/2021 1824  AMMONIA 20    Coagulation Profile: Recent Labs  Lab 03/14/2021 1126  INR 1.0    Cardiac Enzymes: No results for input(s): CKTOTAL, CKMB, CKMBINDEX, TROPONINI in the last 168 hours. BNP (last 3 results) No results for input(s): PROBNP in the last 8760 hours. HbA1C: No results for input(s): HGBA1C in the last 72 hours. CBG: Recent Labs  Lab 03/07/2021 1604  GLUCAP  86    Lipid Profile: No results for input(s): CHOL, HDL, LDLCALC, TRIG, CHOLHDL, LDLDIRECT in the last 72 hours. Thyroid Function Tests: No results for input(s): TSH, T4TOTAL, FREET4, T3FREE, THYROIDAB in the last 72 hours. Anemia Panel: Recent Labs    03/11/21 0251 03/12/21 0300  FERRITIN 345* 321    Sepsis Labs: Recent Labs  Lab 03/23/2021 1533 03/24/2021 1612 03/10/21 1041 03/11/21 0251 03/11/21 1015  PROCALCITON  --  5.97  --   --   --   LATICACIDVEN 2.3*  --  1.6 4.9* 1.9     Recent Results (from the past 240 hour(s))  Blood Culture (routine x 2)     Status: None (Preliminary result)   Collection Time: 03/15/2021 11:26 AM   Specimen: BLOOD RIGHT HAND  Result Value Ref Range Status   Specimen Description BLOOD RIGHT HAND  Final   Special Requests   Final  BOTTLES DRAWN AEROBIC AND ANAEROBIC Blood Culture results may not be optimal due to an inadequate volume of blood received in culture bottles   Culture   Final    NO GROWTH 3 DAYS Performed at [facility], [address], [place], [place] [address]    Report Status PENDING  Incomplete  Resp Panel by RT-PCR (Flu A&B, Covid) Nasopharyngeal Swab     Status: Abnormal   Collection Time: 03/10/2021  4:07 PM   Specimen: Nasopharyngeal Swab; Nasopharyngeal(NP) swabs in vial transport medium  Result Value Ref Range Status   SARS Coronavirus 2 by RT PCR POSITIVE (A) NEGATIVE Final    Comment: (NOTE) SARS-CoV-2 target nucleic acids are DETECTED.  The SARS-CoV-2 RNA is generally detectable in upper respiratory specimens during the acute phase of infection. Positive results are indicative of the presence of the identified virus, but do not rule out bacterial infection or co-infection with other pathogens not detected by the test. Clinical correlation with patient history and other diagnostic information is necessary to determine patient infection status. The expected result is Negative.  Fact Sheet for  Patients: EntrepreneurPulse.com.au  Fact Sheet for Healthcare Providers: IncredibleEmployment.be  This test is not yet approved or cleared by the [place] FDA and  has been authorized for detection and/or diagnosis of SARS-CoV-2 by FDA under an Emergency Use Authorization (EUA).  This EUA will remain in effect (meaning this test can be used) for the duration of  the COVID-19 declaration under Section 564(b)(1) of the A ct, 21 U.S.C. section 360bbb-3(b)(1), unless the authorization is terminated or revoked sooner.     Influenza A by PCR NEGATIVE NEGATIVE Final   Influenza B by PCR NEGATIVE NEGATIVE Final    Comment: (NOTE) The Xpert Xpress SARS-CoV-2/FLU/RSV plus assay is intended as an aid in the diagnosis of influenza from Nasopharyngeal swab specimens and should not be used as a sole basis for treatment. Nasal washings and aspirates are unacceptable for Xpert Xpress SARS-CoV-2/FLU/RSV testing.  Fact Sheet for Patients: EntrepreneurPulse.com.au  Fact Sheet for Healthcare Providers: IncredibleEmployment.be  This test is not yet approved or cleared by the [place] FDA and has been authorized for detection and/or diagnosis of SARS-CoV-2 by FDA under an Emergency Use Authorization (EUA). This EUA will remain in effect (meaning this test can be used) for the duration of the COVID-19 declaration under Section 564(b)(1) of the Act, 21 U.S.C. section 360bbb-3(b)(1), unless the authorization is terminated or revoked.  Performed at [facility], [address] [place], [place] [address]   Blood Culture (routine x 2)     Status: None (Preliminary result)   Collection Time: 03/08/2021  4:10 PM   Specimen: BLOOD  Result Value Ref Range Status   Specimen Description BLOOD [facility]  Final   Special Requests BOTTLES DRAWN AEROBIC AND ANAEROBIC BCAV  Final   Culture   Final    NO GROWTH 3  DAYS Performed at [facility], [address] [place], [place] [address]    Report Status PENDING  Incomplete      Radiology Studies: CT ABDOMEN PELVIS W CONTRAST  Result Date: 03/11/2021 CLINICAL DATA:  Abdominal pain, acute, nonlocalized. COVID pneumonia. EXAM: CT ABDOMEN AND PELVIS WITH CONTRAST TECHNIQUE: Multidetector CT imaging of the abdomen and pelvis was performed using the standard protocol following bolus administration of intravenous contrast. CONTRAST:  112m OMNIPAQUE IOHEXOL 300 MG/ML  SOLN COMPARISON:  03/06/2021 FINDINGS: Lower chest: There is extensive bibasilar ground-glass pulmonary infiltrate and focal consolidation within  the right lower lobe in keeping with multifocal pneumonic infiltrate. No pleural effusion. Extensive multi-vessel coronary artery calcification. Global cardiac size within normal limits. Moderate hiatal hernia. Hepatobiliary: No focal liver abnormality is seen. No gallstones, gallbladder wall thickening, or biliary dilatation. High attenuation material within the gallbladder lumen likely reflects vicarious excretion of contrast. Pancreas: Unremarkable Spleen: Unremarkable Adrenals/Urinary Tract: The adrenal glands are unremarkable. The kidneys are normal in size and position. Multiple simple cortical cyst noted within the left kidney. There is progressive perinephric soft tissue infiltration surrounding the upper pole the right kidney of unclear significance. This may represent mild progressive hemorrhage or urine in the setting of trauma. No significant subcapsular hematoma, however, identified. No hydronephrosis. No intrarenal or ureteral calculi. Foley catheter balloon is seen within a decompressed bladder lumen. Stomach/Bowel: Diastasis of the rectus abdominus musculature and laxity of the anterior abdominal wall again noted with marked attenuation of the overlying subcutaneous fat. The stomach, small bowel, and large bowel are unremarkable. No free  intraperitoneal gas or fluid. Vascular/Lymphatic: Aortic atherosclerosis. No enlarged abdominal or pelvic lymph nodes. Reproductive: Prostate is unremarkable. Other: None Musculoskeletal: L4-S1 lumbar fusion with instrumentation is again noted. Advanced degenerative changes are noted throughout the visualized thoracolumbar spine. Remote T9 and T11 compression deformities are again noted. 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 No acute bone abnormality. IMPRESSION: Extensive bibasilar pulmonary infiltrate, likely infectious or inflammatory, and in keeping with the given history of acute COVID-19 pneumonia. Extensive multi-vessel coronary artery calcification. Slight interval increase in mild perinephric infiltrative fluid surrounding the upper pole the right kidney. Correlation for history of local trauma may be helpful as this may represent minimal perinephric hemorrhage or urine. Moderate hiatal hernia. Stable remote compression deformities of T9 and T11. 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 Electronically Signed   By: Fidela Salisbury M.D.   On: 03/11/2021 02:57   DG Chest Port 1 View  Result Date: 03/12/2021 CLINICAL DATA:  Acute respiratory failure, hypoxia EXAM: PORTABLE CHEST 1 VIEW COMPARISON:  03/04/2021 FINDINGS: Patchy bilateral airspace disease, diffuse on the right and in the left lower to mid lung, worsening on the left since prior study. Heart is normal size. No effusions or acute bony abnormality. IMPRESSION: Bilateral airspace disease, right greater than left, worsening on the left since prior study. Electronically Signed   By: Rolm Baptise M.D.   On: 03/12/2021 08:43    Scheduled Meds:  Chlorhexidine Gluconate Cloth  6 each Topical Daily   enoxaparin (LOVENOX) injection  1 mg/kg Subcutaneous Q12H   ipratropium-albuterol  3 mL Nebulization Q6H   methylPREDNISolone (SOLU-MEDROL) injection  40 mg Intravenous Q12H   Continuous Infusions:  sodium  chloride 250 mL (03/12/21 0825)   dexmedetomidine (PRECEDEX) IV infusion 1.2 mcg/kg/hr (03/12/21 0845)   lactated ringers     piperacillin-tazobactam (ZOSYN)  IV       LOS: 3 days   Time spent: 42 minutes. More than 50% of the time was spent in counseling/coordination of care  Lorella Nimrod, MD [facility]  If 7PM-7AM, please contact night-coverage [email or web address]  03/12/2021, 1:23 PM   This record has been created using Systems analyst. Errors have been sought and corrected,but may not always be located. Such creation errors do not reflect on the standard of care.

## 2021-03-12 NOTE — Progress Notes (Signed)
Family requested pt be taken off heated HFNC and to be placed on 15L HFNC. Pt seems to tol well.

## 2021-03-12 NOTE — Progress Notes (Signed)
Pharmacy Antibiotic Note  Travis Haynes is a 75 y.o. male with PMH of CAD, rheumatoid arthritis, HLD, HTN who was admitted on 03/24/2021 with abdominal wall cellulitis and sepsis.  Pharmacy has been consulted for Zosyn dosing.  Afeb>24h, WBC 13.2>10.5>8.9, procal 5.97  Bcx NG x3 days, SARS Coronavirus positive, resp panel sent  Plan: Zosyn --Will start Zosyn 3.375g IV q8h  Will continue to monitor renal function and adjust dose as clinically indicated   Height: 5\' 8"  (172.7 cm) Weight: 58.3 kg (128 lb 8.5 oz) IBW/kg (Calculated) : 68.4  Temp (24hrs), Avg:98.3 F (36.8 C), Min:98 F (36.7 C), Max:98.8 F (37.1 C)  Recent Labs  Lab 03/17/2021 1126 03/06/2021 1533 03/10/21 0752 03/10/21 1041 03/11/21 0251 03/11/21 1015 03/12/21 0300  WBC 17.1*  --  13.2*  --  10.5  --  8.9  CREATININE 1.43*  --  1.28*  --  1.26*  --  0.81  LATICACIDVEN 3.1* 2.3*  --  1.6 4.9* 1.9  --      Estimated Creatinine Clearance: 66 mL/min (by C-G formula based on SCr of 0.81 mg/dL).    Allergies  Allergen Reactions   Ativan [Lorazepam] Other (See Comments)    Pt states medication made him hyper   Gold Au 198 [Gold] Other (See Comments)    Pt states platelet count was low. This was in 19.    Antimicrobials this admission: Ongoing Zosyn 12/19 >>  Completed 12/16 cefepime, metronidazole, vanc  >> 12/19  Microbiology results: 12/16 Bcx: NG x3 days 12/16 SARS Coronavirus: positive  12/16 Resp panel: sent  Thank you for allowing pharmacy to be a part of this patients care.  Narda Rutherford, PharmD Pharmacy Resident  03/12/2021 1:34 PM

## 2021-03-12 NOTE — Progress Notes (Signed)
CRITICAL CARE PROGRESS NOTE    Name: Travis Haynes MRN: [id number] DOB: 1945-04-24     LOS: 3   SUBJECTIVE FINDINGS & SIGNIFICANT EVENTS    Patient description:    This is a 75yo M with hx of HTN, dyslipidemia RHA, CKD3, CAD, lymphoma, CHF EF40%, GERD, who came in with DOE/SOB at rest with abd pain.  Family reported AMS x 3d, they denied dementia at baseline.  He does have recent medical acute issue of abdominal wall cellulitis.He has PCR + on 03/08/21 with cycle threshold value 17.  He is partial code with OK to intubate NO cpr advanced directive. PCCM consultation for acute hypoxemic respiratory failure due to [place] with concomitant abd wall cellulitis.   I met with son and granddaughter who is RN here and we reviewed findings and medical plan.   03/12/21- patient with no acute events overnight, required haldol and morphine overnight with progressive aggitation and Precedex has been initiated. He has family at bedside , granddaughter is here during evaluation this am and son usually here all day. COMFORT CARE PER PALLIATIVE    Lines/tubes :   Microbiology/Sepsis markers: Results for orders placed or performed during the hospital encounter of 03/12/2021  Blood Culture (routine x 2)     Status: None (Preliminary result)   Collection Time: 02/26/2021 11:26 AM   Specimen: BLOOD RIGHT HAND  Result Value Ref Range Status   Specimen Description BLOOD RIGHT HAND  Final   Special Requests   Final    BOTTLES DRAWN AEROBIC AND ANAEROBIC Blood Culture results may not be optimal due to an inadequate volume of blood received in culture bottles   Culture   Final    NO GROWTH 3 DAYS Performed at [facility], [address] [place], [place] [address]    Report Status PENDING  Incomplete  Resp Panel by RT-PCR  (Flu A&B, Covid) Nasopharyngeal Swab     Status: Abnormal   Collection Time: 03/03/2021  4:07 PM   Specimen: Nasopharyngeal Swab; Nasopharyngeal(NP) swabs in vial transport medium  Result Value Ref Range Status   SARS Coronavirus 2 by RT PCR POSITIVE (A) NEGATIVE Final    Comment: (NOTE) SARS-CoV-2 target nucleic acids are DETECTED.  The SARS-CoV-2 RNA is generally detectable in upper respiratory specimens during the acute phase of infection. Positive results are indicative of the presence of the identified virus, but do not rule out bacterial infection or co-infection with other pathogens not detected by the test. Clinical correlation with patient history and other diagnostic information is necessary to determine patient infection status. The expected result is Negative.  Fact Sheet for Patients: EntrepreneurPulse.com.au  Fact Sheet for Healthcare Providers: IncredibleEmployment.be  This test is not yet approved or cleared by the [place] FDA and  has been authorized for detection and/or diagnosis of SARS-CoV-2 by FDA under an Emergency Use Authorization (EUA).  This EUA will remain in effect (meaning this test can be used) for the duration of  the COVID-19 declaration under Section 564(b)(1) of the A ct, 21 U.S.C. section 360bbb-3(b)(1), unless the authorization is terminated or revoked sooner.     Influenza A by PCR NEGATIVE NEGATIVE Final   Influenza B by PCR NEGATIVE NEGATIVE Final    Comment: (NOTE) The Xpert Xpress SARS-CoV-2/FLU/RSV plus assay is intended as an aid in the diagnosis of influenza from Nasopharyngeal swab specimens and should not be used as a sole basis for treatment. Nasal washings and aspirates are unacceptable for Xpert Xpress  SARS-CoV-2/FLU/RSV testing.  Fact Sheet for Patients: EntrepreneurPulse.com.au  Fact Sheet for Healthcare Providers: IncredibleEmployment.be  This  test is not yet approved or cleared by the [place] FDA and has been authorized for detection and/or diagnosis of SARS-CoV-2 by FDA under an Emergency Use Authorization (EUA). This EUA will remain in effect (meaning this test can be used) for the duration of the COVID-19 declaration under Section 564(b)(1) of the Act, 21 U.S.C. section 360bbb-3(b)(1), unless the authorization is terminated or revoked.  Performed at [facility], [address] [place], [place] [address]   Blood Culture (routine x 2)     Status: None (Preliminary result)   Collection Time: 03/24/2021  4:10 PM   Specimen: BLOOD  Result Value Ref Range Status   Specimen Description BLOOD [facility]  Final   Special Requests BOTTLES DRAWN AEROBIC AND ANAEROBIC BCAV  Final   Culture   Final    NO GROWTH 3 DAYS Performed at [facility], [address], [place], [place] [address]    Report Status PENDING  Incomplete    Anti-infectives:  Anti-infectives (From admission, onward)    Start     Dose/Rate Route Frequency Ordered Stop   03/10/21 1215  vancomycin (VANCOCIN) IVPB 1000 mg/200 mL premix        1,000 mg 200 mL/hr over 60 Minutes Intravenous Every 24 hours 03/10/21 1127     03/10/21 1200  vancomycin (VANCOREADY) IVPB 750 mg/150 mL  Status:  Discontinued        750 mg 150 mL/hr over 60 Minutes Intravenous Every 24 hours 03/08/2021 1439 03/10/21 1127   03/10/21 1000  remdesivir 100 mg in sodium chloride 0.9 % 100 mL IVPB  Status:  Discontinued       See Hyperspace for full Linked Orders Report.   100 mg 200 mL/hr over 30 Minutes Intravenous Daily 03/14/2021 1529 03/10/21 1004   03/10/21 0000  ceFEPIme (MAXIPIME) 2 g in sodium chloride 0.9 % 100 mL IVPB        2 g 200 mL/hr over 30 Minutes Intravenous Every 12 hours 03/02/2021 1439     03/20/2021 2200  metroNIDAZOLE (FLAGYL) IVPB 500 mg        500 mg 100 mL/hr over 60 Minutes Intravenous Every 12 hours 03/20/2021 1425     03/16/2021 1700  remdesivir 200 mg  in sodium chloride 0.9% 250 mL IVPB  Status:  Discontinued       See Hyperspace for full Linked Orders Report.   200 mg 580 mL/hr over 30 Minutes Intravenous Once 02/24/2021 1529 03/10/21 1004   03/17/2021 1200  vancomycin (VANCOREADY) IVPB 1250 mg/250 mL        1,250 mg 166.7 mL/hr over 90 Minutes Intravenous  Once 03/17/2021 1125 03/12/2021 1457   02/23/2021 1130  ceFEPIme (MAXIPIME) 2 g in sodium chloride 0.9 % 100 mL IVPB        2 g 200 mL/hr over 30 Minutes Intravenous  Once 02/28/2021 1122 03/07/2021 1246   03/02/2021 1130  metroNIDAZOLE (FLAGYL) IVPB 500 mg        500 mg 100 mL/hr over 60 Minutes Intravenous  Once 03/17/2021 1122 03/15/2021 1246        Consults: Treatment Team:  Rusty Aus, DO Ottie Glazier, MD Jules Husbands, MD     PAST MEDICAL HISTORY   Past Medical History:  Diagnosis Date   Arthritis    rheumatoid arthritis, takes prednisone for this   Barrett's esophagus  Cancer ([place]) 1999   non hodgkins lymphoma   Chronic back pain    Chronic pain syndrome    Claustrophobia    Coronary artery disease    GERD (gastroesophageal reflux disease)    barretts esophagus that requires dilation every couple years   Hemorrhoids    Hernia of abdominal cavity    History of hiatal hernia    Hyperlipidemia    Hypertension    Lumbago    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   Wears dentures    Weight loss 11/2017   30 lb weight loss over 3 years     SURGICAL HISTORY   Past Surgical History:  Procedure Laterality Date   [place]   2 fusions then 1 surgery to fix the other two (lower)..plates and screws   CATARACT EXTRACTION W/PHACO Left 12/31/2018   Procedure: CATARACT EXTRACTION PHACO AND INTRAOCULAR LENS PLACEMENT ([place]) LEFT VISION BLUE;  Surgeon: Marchia Meiers, MD;  Location: [facility] ORS;  Service: Ophthalmology;  Laterality: Left;  Lot #[id number] H [place]: 01:51.7 CDE: 18.59   CYSTECTOMY     from buttocks    ESOPHAGEAL DILATION     multiple times   ESOPHAGOGASTRODUODENOSCOPY (EGD) WITH PROPOFOL N/A 12/05/2014   Procedure: ESOPHAGOGASTRODUODENOSCOPY (EGD) WITH PROPOFOL;  Surgeon: Hulen Luster, MD;  Location: [facility] ENDOSCOPY;  Service: Gastroenterology;  Laterality: N/A;   EXCISION PARTIAL PHALANX Right 12/09/2017   Procedure: EXCISION PARTIAL PHALANX-GREAT TOE;  Surgeon: Sharlotte Alamo, DPM;  Location: [facility] ORS;  Service: Podiatry;  Laterality: Right;   FOOT SURGERY     intestine repair  1998   during biopsy his intestines were nicked requiring repair   MULTIPLE TOOTH EXTRACTIONS     NECK SURGERY  1996   fused 2 vertebrae in neck (plate plus screws)   RADIOLOGY WITH ANESTHESIA N/A 03/09/2019   Procedure: MRI WITH ANESTHESIA LUMBAR WITHOUT CONTRAST;  Surgeon: Radiologist, Medication, MD;  Location: [place];  Service: Radiology;  Laterality: N/A;   RADIOLOGY WITH ANESTHESIA N/A 09/23/2019   Procedure: MRI WITH ANESTHESIA; LUMBAR SPINE WITHOUT CONTRAST;  Surgeon: Radiologist, Medication, MD;  Location: [place];  Service: Radiology;  Laterality: N/A;   Reduction masseter muscle/bone extraoral       FAMILY HISTORY   Family History  Problem Relation Age of Onset   Heart disease Mother        heart attack   Arthritis Mother    Arthritis Father      SOCIAL HISTORY   Social History   Tobacco Use   Smoking status: Former    Types: Cigarettes    Quit date: 1998    Years since quitting: 24.9   Smokeless tobacco: Never  Vaping Use   Vaping Use: Never used  Substance Use Topics   Alcohol use: No    Comment: stopped d/t cluster headaches   Drug use: No     MEDICATIONS   Current Medication:  Current Facility-Administered Medications:    0.9 %  sodium chloride infusion, 250 mL, Intravenous, Continuous, Darel Hong D, NP, Last Rate: 10 mL/hr at 03/12/21 0825, 250 mL at 03/12/21 0825   ceFEPIme (MAXIPIME) 2 g in sodium chloride 0.9 % 100 mL IVPB, 2 g, Intravenous, Q12H, Rauer, Samantha O, RPH,  Last Rate: 200 mL/hr at 03/11/21 2342, 2 g at 03/11/21 2342   Chlorhexidine Gluconate Cloth 2 % PADS 6 each, 6 each, Topical, Daily, Ottie Glazier, MD, 6 each at 03/11/21 [phone]  dexmedetomidine (PRECEDEX) 400 MCG/100ML (4 mcg/mL) infusion, 0.4-1.2 mcg/kg/hr, Intravenous, Titrated, Ottie Glazier, MD, Last Rate: 17.49 mL/hr at 03/12/21 0845, 1.2 mcg/kg/hr at 03/12/21 0845   dextromethorphan-guaiFENesin ([place] DM) 30-600 MG per 12 hr tablet 1 tablet, 1 tablet, Oral, BID PRN, Ivor Costa, MD   enoxaparin (LOVENOX) injection 40 mg, 40 mg, Subcutaneous, Q24H, Ivor Costa, MD, 40 mg at 03/11/21 1813   haloperidol lactate (HALDOL) injection 5 mg, 5 mg, Intravenous, Q4H PRN, Tonye Royalty, NP, 5 mg at 03/12/21 7867   hydrALAZINE (APRESOLINE) injection 5 mg, 5 mg, Intravenous, Q2H PRN, Ivor Costa, MD   HYDROmorphone (DILAUDID) injection 1 mg, 1 mg, Intravenous, Q2H PRN, Tonye Royalty, NP, 1 mg at 03/12/21 0803   ipratropium-albuterol (DUONEB) 0.5-2.5 (3) MG/3ML nebulizer solution 3 mL, 3 mL, Nebulization, Q6H, Darel Hong D, NP, 3 mL at 03/12/21 0915   lactated ringers infusion, , Intravenous, Continuous, Lorella Nimrod, MD   methylPREDNISolone sodium succinate (SOLU-MEDROL) 40 mg/mL injection 40 mg, 40 mg, Intravenous, Q12H, Ivor Costa, MD, 40 mg at 03/12/21 0309   metroNIDAZOLE (FLAGYL) IVPB 500 mg, 500 mg, Intravenous, Q12H, Ivor Costa, MD, Last Rate: 100 mL/hr at 03/11/21 2154, 500 mg at 03/11/21 2154   polyvinyl alcohol (LIQUIFILM TEARS) 1.4 % ophthalmic solution 2 drop, 2 drop, Both Eyes, PRN, Ottie Glazier, MD, 2 drop at 03/12/21 0814   vancomycin (VANCOCIN) IVPB 1000 mg/200 mL premix, 1,000 mg, Intravenous, Q24H, Oswald Hillock, RPH, Last Rate: 200 mL/hr at 03/11/21 1357, 1,000 mg at 03/11/21 1357    ALLERGIES   Ativan [lorazepam] and Gold au 198 [gold]    REVIEW OF SYSTEMS     10 point ROS done and patient is confused with encephalopathy  PHYSICAL EXAMINATION   Vital  Signs: Temp:  [98 F (36.7 C)-98.4 F (36.9 C)] 98 F (36.7 C) (12/19 0400) Pulse Rate:  [56-119] 56 (12/19 0400) Resp:  [12-37] 14 (12/19 0400) BP: (108-154)/(65-96) 122/67 (12/19 0400) SpO2:  [68 %-100 %] 93 % (12/19 0920) FiO2 (%):  [100 %] 100 % (12/19 0920)  GENERAL:Age appropriate HEAD: Normocephalic, atraumatic.  EYES: Pupils equal, round, reactive to light.  No scleral icterus.  MOUTH: Moist mucosal membrane. NECK: Supple. No thyromegaly. No nodules. No JVD.  PULMONARY: Rhonchi bilaterally  CARDIOVASCULAR: S1 and S2. Regular rate and rhythm. No murmurs, rubs, or gallops.  GASTROINTESTINAL: Soft +tender, +distended. No masses. Negative bowel sounds. No hepatosplenomegaly.  MUSCULOSKELETAL: No swelling, clubbing, or edema.  NEUROLOGIC: Mild distress due to acute illness GCS9 SKIN:intact,warm,dry   PERTINENT DATA     Infusions:  sodium chloride 250 mL (03/12/21 0825)   ceFEPime (MAXIPIME) IV 2 g (03/11/21 2342)   dexmedetomidine (PRECEDEX) IV infusion 1.2 mcg/kg/hr (03/12/21 0845)   lactated ringers     metronidazole 500 mg (03/11/21 2154)   vancomycin 1,000 mg (03/11/21 1357)   Scheduled Medications:  Chlorhexidine Gluconate Cloth  6 each Topical Daily   enoxaparin (LOVENOX) injection  40 mg Subcutaneous Q24H   ipratropium-albuterol  3 mL Nebulization Q6H   methylPREDNISolone (SOLU-MEDROL) injection  40 mg Intravenous Q12H   PRN Medications: dextromethorphan-guaiFENesin, haloperidol lactate, hydrALAZINE, HYDROmorphone (DILAUDID) injection, polyvinyl alcohol Hemodynamic parameters:   Intake/Output: 12/18 0701 - 12/19 0700 In: 894.5 [P.O.:100; I.V.:594.5; IV Piggyback:200] Out: 945 [Urine:945]  Ventilator  Settings: FiO2 (%):  [100 %] 100 %   LAB RESULTS:  Basic Metabolic Panel: Recent Labs  Lab 03/05/2021 1126 03/10/21 0752 03/11/21 0251 03/11/21 1015 03/12/21 0300  NA 135 141 141  --  144  K 3.5 3.6 3.4*  --  3.3*  CL 96* 103 104  --  110  CO2 24  21* 23  --  25  GLUCOSE 92 134* 119*  --  153*  BUN 38* 38* 41*  --  36*  CREATININE 1.43* 1.28* 1.26*  --  0.81  CALCIUM 8.5* 7.6* 7.7*  --  7.8*  MG  --  2.1  --  2.0  --   PHOS  --  5.1*  --  2.3*  --     Liver Function Tests: Recent Labs  Lab 02/28/2021 1126 03/10/21 0752 03/11/21 0251 03/12/21 0300  AST 332* 196* 132* 59*  ALT 72* 66* 63* 50*  ALKPHOS 82 68 66 64  BILITOT 1.3* 1.5* 1.3* 1.5*  PROT 6.6 5.8* 5.7* 5.4*  ALBUMIN 3.0* 2.4* 2.4* 2.1*    No results for input(s): LIPASE, AMYLASE in the last 168 hours. Recent Labs  Lab 02/23/2021 1824  AMMONIA 20    CBC: Recent Labs  Lab 03/07/2021 1126 03/10/21 0752 03/11/21 0251 03/12/21 0300  WBC 17.1* 13.2* 10.5 8.9  NEUTROABS 15.4* 11.4* 9.6* 7.8*  HGB 13.2 11.6* 11.2* 11.3*  HCT 39.3 35.2* 33.8* 33.3*  MCV 92.9 93.6 93.6 94.6  PLT 213 197 160 126*    Cardiac Enzymes: No results for input(s): CKTOTAL, CKMB, CKMBINDEX, TROPONINI in the last 168 hours. BNP: Invalid input(s): POCBNP CBG: Recent Labs  Lab 03/05/2021 1604  GLUCAP 86        IMAGING RESULTS:  Imaging: CT ABDOMEN PELVIS W CONTRAST  Result Date: 03/11/2021 CLINICAL DATA:  Abdominal pain, acute, nonlocalized. COVID pneumonia. EXAM: CT ABDOMEN AND PELVIS WITH CONTRAST TECHNIQUE: Multidetector CT imaging of the abdomen and pelvis was performed using the standard protocol following bolus administration of intravenous contrast. CONTRAST:  150m OMNIPAQUE IOHEXOL 300 MG/ML  SOLN COMPARISON:  03/10/2021 FINDINGS: Lower chest: There is extensive bibasilar ground-glass pulmonary infiltrate and focal consolidation within the right lower lobe in keeping with multifocal pneumonic infiltrate. No pleural effusion. Extensive multi-vessel coronary artery calcification. Global cardiac size within normal limits. Moderate hiatal hernia. Hepatobiliary: No focal liver abnormality is seen. No gallstones, gallbladder wall thickening, or biliary dilatation. High  attenuation material within the gallbladder lumen likely reflects vicarious excretion of contrast. Pancreas: Unremarkable Spleen: Unremarkable Adrenals/Urinary Tract: The adrenal glands are unremarkable. The kidneys are normal in size and position. Multiple simple cortical cyst noted within the left kidney. There is progressive perinephric soft tissue infiltration surrounding the upper pole the right kidney of unclear significance. This may represent mild progressive hemorrhage or urine in the setting of trauma. No significant subcapsular hematoma, however, identified. No hydronephrosis. No intrarenal or ureteral calculi. Foley catheter balloon is seen within a decompressed bladder lumen. Stomach/Bowel: Diastasis of the rectus abdominus musculature and laxity of the anterior abdominal wall again noted with marked attenuation of the overlying subcutaneous fat. The stomach, small bowel, and large bowel are unremarkable. No free intraperitoneal gas or fluid. Vascular/Lymphatic: Aortic atherosclerosis. No enlarged abdominal or pelvic lymph nodes. Reproductive: Prostate is unremarkable. Other: None Musculoskeletal: L4-S1 lumbar fusion with instrumentation is again noted. Advanced degenerative changes are noted throughout the visualized thoracolumbar spine. Remote T9 and T11 compression deformities are again noted. 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 No acute bone abnormality. IMPRESSION: Extensive bibasilar pulmonary infiltrate, likely infectious or inflammatory, and in keeping with the given history of acute COVID-19 pneumonia. Extensive multi-vessel coronary artery calcification. Slight interval increase in mild perinephric infiltrative fluid  surrounding the upper pole the right kidney. Correlation for history of local trauma may be helpful as this may represent minimal perinephric hemorrhage or urine. Moderate hiatal hernia. Stable remote compression deformities of T9 and T11. Avascular  necrosis of the left femoral head again noted. Electronically Signed   By: Fidela Salisbury M.D.   On: 03/11/2021 02:57   DG Chest Port 1 View  Result Date: 03/12/2021 CLINICAL DATA:  Acute respiratory failure, hypoxia EXAM: PORTABLE CHEST 1 VIEW COMPARISON:  03/01/2021 FINDINGS: Patchy bilateral airspace disease, diffuse on the right and in the left lower to mid lung, worsening on the left since prior study. Heart is normal size. No effusions or acute bony abnormality. IMPRESSION: Bilateral airspace disease, right greater than left, worsening on the left since prior study. Electronically Signed   By: Rolm Baptise M.D.   On: 03/12/2021 08:43   _0 @ DG Chest Port 1 View  Result Date: 03/12/2021 CLINICAL DATA:  Acute respiratory failure, hypoxia EXAM: PORTABLE CHEST 1 VIEW COMPARISON:  03/20/2021 FINDINGS: Patchy bilateral airspace disease, diffuse on the right and in the left lower to mid lung, worsening on the left since prior study. Heart is normal size. No effusions or acute bony abnormality. IMPRESSION: Bilateral airspace disease, right greater than left, worsening on the left since prior study. Electronically Signed   By: Rolm Baptise M.D.   On: 03/12/2021 08:43        ASSESSMENT AND PLAN    -Multidisciplinary rounds held today   Acute COVID19 pneumonia -Remdesevir declined by family  -steroids - currently solumedrol 40 bid -Diuresis - Lasix 40 IV daily - monitor UOP - utilize external urinary catheter if possible -Self prone if patient can tolerate  -encourage to use IS and Acapella device for bronchopulmonary hygiene when able -d/c hepatotoxic medications while on remdesevir -supportive care with ICU telemetry monitoring -PT/OT when possible -procalcitonin, CRP and ferritin trending -on HFNC - reviewed with RT -Dc Cefepime due to encephalopathy   Abdominal wall cellulitis -ventral hernia - surgery on case appreciate management and recommendations -CT abdomen- 1. No  evidence of bowel obstruction. 2. Large fat and nonobstructed bowel containing ventral hernia with a 6.4 cm aperture. 3. Laxity of the ventral abdominal wall with peritoneal fat and loops of nonobstructed bowel extending to the cutaneous skin surface. ICU monitoring- -zosyn IC   Altered mental status with encephalopathy  Due to COVID19    - there is aggitation with hyperactive delerium and confusion - have initiated low dose precedex.    Rheumatoid arthritis With severe deforming arthropathy of hands and feet bilaterally   - signficant source of pain and discomfort -continue solumedrol bid 1m iv  Renal Failure-most likely due to ATN-RESOLVED -follow chem 7 -follow UO -continue Foley Catheter-assess need daily   Avascular necrosis of left femoral head    - chronicity unknown    - will address post recovery from COVID    -elevated risk for BM emobli    Septic shock- RULED OUT -use vasopressors to keep MAP>65 -follow ABG and LA -follow up cultures -emperic ABX -consider stress dose steroids   ID -continue IV abx as prescibed -follow up cultures   GI/Nutrition GI PROPHYLAXIS as indicated DIET-->TF's as tolerated Constipation protocol as indicated  ENDO - ICU hypoglycemic\Hyperglycemia protocol -check FSBS per protocol   ELECTROLYTES -follow labs as needed -replace as needed -pharmacy consultation   DVT/GI PRX ordered -SCDs  TRANSFUSIONS AS NEEDED MONITOR FSBS ASSESS the need for LABS as needed  Critical care provider statement:   Total critical care time: 33 minutes   Performed by: Lanney Gins MD   Critical care time was exclusive of separately billable procedures and treating other patients.   Critical care was necessary to treat or prevent imminent or life-threatening deterioration.   Critical care was time spent personally by me on the following activities: development of treatment plan with patient and/or surrogate as well as nursing,  discussions with consultants, evaluation of patient's response to treatment, examination of patient, obtaining history from patient or surrogate, ordering and performing treatments and interventions, ordering and review of laboratory studies, ordering and review of radiographic studies, pulse oximetry and re-evaluation of patient's condition.    Ottie Glazier, M.D.  Pulmonary & Critical Care Medicine

## 2021-03-12 NOTE — Consult Note (Addendum)
In my                                                                                   Consultation Note Date: 03/12/2021   Patient Name: Travis Haynes  DOB: 05-29-1945  MRN: [id number]  Age / Sex: 75 y.o., male  PCP: Venita Lick, NP Referring Physician: Lorella Nimrod, MD  Reason for Consultation: Establishing goals of care  HPI/Patient Profile: 75 y.o. male  with past medical history of HTN, HLD, GERD, rheumatoid arthritis, CKD (3A), CAD, non-Hodgkin's lymphoma, CHF (EF 40-45), and TV (tx 2015) admitted on 03/11/2021 with altered mental status, shortness of breath, and abdominal pain.  As per son, patient tested positive on 12/15 for COVID-19 and was evaluated at [facility].  Patient and family declined remdesivir and decided to bring the patient home to recover from COVID-19.  However, as per son patient continued to decline at home and presented to [facility] emergency department with increased pain at chronic ventral hernia site.  Palliative medicine team was consulted to discuss goals of care.   Clinical Assessment and Goals of Care: I have reviewed medical records including EPIC notes, labs and imaging, assessed the patient and then spoke with patient's son Travis Haynes over the phone to discuss diagnosis prognosis, [place], EOL wishes, disposition and options.  I introduced Palliative Medicine as specialized medical care for people living with serious illness. It focuses on providing relief from the symptoms and stress of a serious illness. The goal is to improve quality of life for both the patient and the family.  We discussed a brief life review of the patient.  Patient has been married to his wife Travis Haynes for over 68 years.  Patient has been on disability for over the last 20 years due to his rheumatoid arthritis.    As far as functional and nutritional status Tou Hayner endorses that the patient was "getting by" at home.  Patient was using the wall to help him walk and transfer from  bed to chair.  As per son, patient's wife is on oxygen and has many medical issues as well complicating her ability to be a caregiver.  Travis Haynes has been balancing taking care of both of his parents with the help of patient's granddaughter Travis Haynes.  We discussed patient's current illness and what it means in the larger context of patient's on-going co-morbidities.  Natural disease trajectory and expectations at EOL were discussed.  I attempted to elicit values and goals of care important to the patient.  Travis Haynes endorses he knows his father can pass at any time but chooses to give his father "a few more weeks".  Travis Haynes shared he does not want to "give up on his dad just yet".  He speaks of miracles and leaving it all in God's hands.  The difference between aggressive medical intervention and comfort care was considered in light of the patient's goals of care.  Again, runny JR.  States he is not ready to move towards full comfort care at this time.  I discussed patient's pain management.  I shared the patient has had a significant amount of IV pain medicine consistently  over the last 24 hours which to me warrants needing a continuous infusion for pain control.  I shared that this continuous pain medication could cause the patient to become increasingly more sleepy but that it would be better able to keep him out of pain and suffering.   I shared that while I understand that Travis Haynes needs more time to process what is happening with his father and that I respect that he wants to give his father time to "turn the corner" we do not want the patient to be in pain or suffer in the interim.  He was in agreement.  Discussed with patient/family the importance of continued conversation with family and the medical providers regarding overall plan of care and treatment options, ensuring decisions are within the context of the patients values and GOCs.    Questions and concerns were addressed. The family was  encouraged to call with questions or concerns.   Palliative medicine team will continue to follow patient throughout his hospitalization and be a support for the patient and family.  Afternoon update:  1505: Notified this provider that granddaughter at bedside would like to speak with the palliative medicine team.  Spoke with patient's granddaughter Travis Haynes who shares that family is in full agreement with moving forward with full comfort measures.  She suggested I speak with Travis Haynes again.    I spoke with Travis Haynes again who said he would like to move forward with comfort measures while still holding out hope that his father will get well.  Full comfort measures described in detail.  Again Travis Haynes agreed to keep patient out of pain and suffering and to move forward with full comfort measures. He verbalized his fatehr could die in 2 hours. I shared that his father is dying whether or not we give him medication for pain, anxiety, and agitation.   Shift to FULL COMFORT MEASURES   Primary Decision Maker NEXT OF KIN -Pt's next of kin is his spouse Travis Haynes [phone] - no answerand HIPPA appropriate VM left  Code Status/Advance Care Planning: DNR FULL COMFORT MEASURES  Prognosis:   < 2 weeks  Discharge Planning: To Be Determined  Primary Diagnoses: Present on Admission:  Acute respiratory disease due to COVID-19 virus  Acute respiratory failure with hypoxia (HCC)  CAD (coronary artery disease)  Chronic combined systolic and diastolic CHF (congestive heart failure) (HCC)  Essential hypertension  Hyperlipidemia  Rheumatoid arthritis (HCC)  Severe sepsis (HCC)  Ventral hernia  Abnormal LFTs  Acute metabolic encephalopathy  CKD (chronic kidney disease), stage IIIa   Physical Exam Vitals and nursing note reviewed.  Constitutional:      Appearance: He is ill-appearing and toxic-appearing.  HENT:     Head: Normocephalic and atraumatic.     Mouth/Throat:     Mouth: Mucous  membranes are dry.  Cardiovascular:     Rate and Rhythm: Normal rate.     Pulses: Normal pulses.  Pulmonary:     Comments: HFNC Abdominal:     Comments: Ventral hernia  Musculoskeletal:     Comments: Generalized weakness  Skin:    General: Skin is dry.    Vital Signs: BP 130/70    Pulse 68    Temp 98 F (36.7 C) (Axillary)    Resp 13    Ht 5\' 8"  (1.727 m)    Wt 58.3 kg    SpO2 93%    BMI 19.54 kg/m  Pain Scale: PAINAD POSS *See Group Information*: 2-Acceptable,Slightly  drowsy, easily aroused Pain Score: Asleep SpO2: SpO2: 93 % O2 Device:SpO2: 93 % O2 Flow Rate: .O2 Flow Rate (L/min): 60 L/min  Palliative Assessment/Data: 20%     I discussed this patient's plan of care with Dr. Reesa Chew, Dr. Lanney Gins, RN Myra.  Thank you for this consult. Palliative medicine will continue to follow and assist holistically.   Time Total: 70 minutes Greater than 50%  of this time was spent counseling and coordinating care related to the above assessment and plan.  Signed by: Jordan Hawks, DNP, FNP-BC Palliative Medicine    Please contact Palliative Medicine Team phone at [phone] for questions and concerns.  For individual provider: See Shea Evans

## 2021-03-12 NOTE — Progress Notes (Signed)
[facility] SURGICAL PROGRESS NOTE (cpt [phone])  Hospital Day(s): 3.   Interval History:  Patient seen and examined Appears very uncomfortable secondary to respiratory distress; unable to provide history; granddaughter present He remains without a leukocytosis; WBC 8.9K Hgb stable at 11.3 Renal function back to normal ranges; sCr - 0.81; UO - 945 ccs Hypokalemia to 3.3 CRP is expectedly high; 17.0 D-dimer 4.38 (peaked at 14.90); VQ ordered, unsure able to tolerate this given respiratory status He continues 0n cefepime Dressing with Vaseline or xeroform gauze  Review of Systems:  Unable to reliably preform secondary to respiratory status/mental status   Vital signs in last 24 hours: [min-max] current  Temp:  [98 F (36.7 C)-98.4 F (36.9 C)] 98 F (36.7 C) (12/19 0400) Pulse Rate:  [56-119] 56 (12/19 0400) Resp:  [12-37] 14 (12/19 0400) BP: (108-154)/(64-96) 122/67 (12/19 0400) SpO2:  [68 %-100 %] 99 % (12/19 0400) FiO2 (%):  [100 %] 100 % (12/19 0322)     Height: 5\' 8"  (172.7 cm) Weight: 58.3 kg BMI (Calculated): 19.55   Intake/Output last 2 shifts:  12/18 0701 - 12/19 0700 In: 894.5 [P.O.:100; I.V.:594.5; IV Piggyback:200] Out: 945 [Urine:945]   Physical Exam:  Constitutional: alert, appears uncomfortable HENT: normocephalic without obvious abnormality  Respiratory: breathing is markedly labored, appears distress, on HFNC Cardiovascular: tachycardic  Gastrointestinal: soft, again with midline hernia, soft, question exposed fat vs hernia sac, this does not appear tender Musculoskeletal: no edema or wounds, motor and sensation grossly intact, NT    Labs:  CBC Latest Ref Rng & Units 03/12/2021 03/11/2021 03/10/2021  WBC 4.0 - 10.5 K/uL 8.9 10.5 13.2(H)  Hemoglobin 13.0 - 17.0 g/dL 11.3(L) 11.2(L) 11.6(L)  Hematocrit 39.0 - 52.0 % 33.3(L) 33.8(L) 35.2(L)  Platelets 150 - 400 K/uL 126(L) 160 197   CMP Latest Ref Rng & Units 03/12/2021 03/11/2021  03/10/2021  Glucose 70 - 99 mg/dL 153(H) 119(H) 134(H)  BUN 8 - 23 mg/dL 36(H) 41(H) 38(H)  Creatinine 0.61 - 1.24 mg/dL 0.81 1.26(H) 1.28(H)  Sodium 135 - 145 mmol/L 144 141 141  Potassium 3.5 - 5.1 mmol/L 3.3(L) 3.4(L) 3.6  Chloride 98 - 111 mmol/L 110 104 103  CO2 22 - 32 mmol/L 25 23 21(L)  Calcium 8.9 - 10.3 mg/dL 7.8(L) 7.7(L) 7.6(L)  Total Protein 6.5 - 8.1 g/dL 5.4(L) 5.7(L) 5.8(L)  Total Bilirubin 0.3 - 1.2 mg/dL 1.5(H) 1.3(H) 1.5(H)  Alkaline Phos 38 - 126 U/L 64 66 68  AST 15 - 41 U/L 59(H) 132(H) 196(H)  ALT 0 - 44 U/L 50(H) 63(H) 66(H)    Imaging studies: No new pertinent imaging studies   Assessment/Plan: (ICD-10's: K76.9) 75 y.o. male with ventral hernia and overlaying skin defect, which is somewhat improved, complicated by respiratory distress/failure secondary to COVID   - Agree that at this point his respiratory status is primary concern. Unsure if he will survive this admission.  - Continue daily dressings with Vaseline or Xeroform gauze, cover, and secure. If he were to develop any drainage, would recommend placing ostomy appliance - He is certainly in no state for any degree of surgical management and with his loss of abdominal domain, this would be extremely complex and extensive.    - Pain control prn - Abx per primary service   - Further management per primary service; general surgery will follow peripherally for now.    All of the above findings and recommendations were discussed with the patient's family (granddaughter at bedside), and the medical team,  and all of patient's family's questions were answered to her expressed satisfaction.  -- Edison Simon, PA-C [facility] 03/12/2021, 8:05 AM [phone] M-F: 7am - 4pm

## 2021-03-12 NOTE — Progress Notes (Addendum)
Rough day.Pallative Care FNP  K Strup in to discuss goals of care with son Adib Wahba. No family in room yet. Please see note by K.Strup FNP. Patient medicated as needed and ordered all day. 1530 Patient made full comfort care. Dilaudud drip ordered but pharmacy had to mix the drip. Order obtained for Dilaudid 2 mg push and Haldol 5 mg since all prn orders were discontinued when patient became comfort care. Given Dilaudid and Haldol with good results.1630 Dilaudid drip arrived and hung as ordered. Patient became very agitated. Dilaudid drip increased as needed to 3 mg/hour. 1830 Patient still extremely agitated and family upset. Dr. Lanney Gins notified. No orders recieived.1900 Diladid drip increased to 4 mg/hour per Hospital doctor.2000 Patient finally calming.

## 2021-03-12 NOTE — Consult Note (Addendum)
ANTICOAGULATION CONSULT NOTE - Initial Consult  Pharmacy Consult for enoxaparin  Indication: VTE treatment   Allergies  Allergen Reactions   Ativan [Lorazepam] Other (See Comments)    Pt states medication made him hyper   Gold Au 198 [Gold] Other (See Comments)    Pt states platelet count was low. This was in 96.    Patient Measurements: Height: 5\' 8"  (172.7 cm) Weight: 58.3 kg (128 lb 8.5 oz) IBW/kg (Calculated) : 68.4  Vital Signs: Temp: 98 F (36.7 C) (12/19 0400) Temp Source: Axillary (12/19 0400) BP: 122/67 (12/19 0400) Pulse Rate: 56 (12/19 0400)  Labs: Recent Labs    03/08/2021 1126 03/10/21 0752 03/11/21 0251 03/12/21 0300  HGB 13.2 11.6* 11.2* 11.3*  HCT 39.3 35.2* 33.8* 33.3*  PLT 213 197 160 126*  APTT 26  --   --   --   LABPROT 13.0  --   --   --   INR 1.0  --   --   --   CREATININE 1.43* 1.28* 1.26* 0.81    Estimated Creatinine Clearance: 66 mL/min (by C-G formula based on SCr of 0.81 mg/dL).   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 male with PMH of CAD,  rheumatoid arthritis, HLD, HTN who was admitted on 03/10/2021 with abdominal wall cellulitis and sepsis. D-dimer above 14>>8.83>>4.38, lower extremity venous Doppler was negative for DVT and unable to obtain V/Q scan. Pharmacy was consulted for enoxaparin dosing.  Renal function improving 1.26>0.81  CBC stable, but noted PLT drop form 160>12 on 12/19  Goal of Therapy:  Monitor platelets by anticoagulation protocol: Yes   Plan:  --Will start Lovenox 57.5 (1mg /kg) [place] q12h --Will monitor CBC every 3 days while on Lovenox  Narda Rutherford, PharmD Pharmacy Resident  03/12/2021 1:39 PM

## 2021-03-13 ENCOUNTER — Other Ambulatory Visit: Payer: Self-pay | Admitting: Physician Assistant

## 2021-03-13 MED ORDER — HYDROMORPHONE HCL 1 MG/ML IJ SOLN
1.0000 mg | Freq: Once | INTRAMUSCULAR | Status: AC
  Administered 2021-03-13: 07:00:00 1 mg via INTRAVENOUS

## 2021-03-13 MED ORDER — HYDROMORPHONE HCL 1 MG/ML IJ SOLN: 0.5000 mg | Freq: Once | INTRAMUSCULAR | Status: DC

## 2021-03-14 LAB — CULTURE, BLOOD (ROUTINE X 2)
Culture: NO GROWTH
Culture: NO GROWTH

## 2021-03-17 ENCOUNTER — Other Ambulatory Visit: Payer: Self-pay | Admitting: Nurse Practitioner

## 2021-03-25 NOTE — Progress Notes (Addendum)
Progress note:  After reviewing the patient's chart, labs, and imaging, I spoke with the patient's nurse Santiago Glad outside of the patient's room.  She shared that the granddaughter [name] is at bedside and has concerns regarding the patient's pain control.  As I entered the room I noticed that the patient's breathing was shallow with long periods of apnea. He appeared mottled with drooping of the nasolabial folds and hyperextension of the neck.   Granddaughter at bedside began to share her concerns with the patient's care overnight. Therapeutic silence, active listening, and space provided for granddaughter to share her thoughts and emotions regarding the patient.    As we spoke, I noted that the patient stopped breathing.  Patient passed while I was present.  I exited the room to give the family time and space while also notifying nurse Santiago Glad that patient appeared to have expired.  RN Santiago Glad and I returned to the room and both auscultated for heart sounds.  No sounds auscultated.  No pulse present.  No inhalations noted.  Patient expired and time of death is 18.  Granddaughter at bedside is appropriately tearful.  Notified that she should reach out to nursing when she is ready for postmortem care to begin.  [place] Ilsa Iha, FNP-BC Palliative Medicine Team Team Phone # [phone]

## 2021-03-25 NOTE — Progress Notes (Signed)
Patient family requested that a physical assessment to not be done.  I visual assessed patient and documented what I was able to observe.  Time of death was 10:30, Jordan Hawks NP was present, granddaughter was present at time of death.  Hydromorphone drip was removed from room at time of death and waste documented in Epic with pharmacist present.  All lines and drains removed and patient prepared for transport to morgue.

## 2021-03-25 NOTE — Progress Notes (Signed)
Travis Haynes, patients granddaughter asking for an increase in patients dilaudid. Stating patient was taken off a precedex drip in ICU but there has been no increase in his dilaudid.   I messaged Dr. Hal Hope about her concerns. Orders placed for a 1mg  bolus of dilaudid.   I administered the 1mg  dilaudid.

## 2021-03-25 NOTE — Progress Notes (Signed)
Patient arrived to unit from ICU with comfort care orders. Granddaughter Danielle at bedside. [place] requests for Mr. Hafford to receive a bolus dose of his Dilaudid and also to get Haldol. [name] stated he was not breathing the same as he was in ICU. Patient having occasional loud breathing and some moaning.  I then started assessing patient and noticed the IV currently being used was leaking. Also the other IV in place was leaking. Danielle stated she was a ER nurse and was I good at [employer]. I'm ok so she wanted to put patients new IV in place. I told her she cannot do this since she is not working and is here as family. I told her I can attempt and if I'm not able IV team will come get one. She stated she did not want people poking him multiple times all over. She called ICU from room phone for ICU nurse to come over and get an IV.   Attempt was made to listen to patient and assess but [name] stated she did not want this done and that it is not required when a patient is comfort care.  I told [name] if she needed anything to let me know.

## 2021-03-25 NOTE — Plan of Care (Addendum)
Time of death 10:30 [place] donor referral number: [phone]

## 2021-03-25 NOTE — Death Summary Note (Signed)
Death Summary  Travis Haynes:[id number] DOB: 04-08-1945 DOA: Mar 19, 2021  PCP: Venita Lick, NP  Admit date: 19-Mar-2021 Date of Death: Mar 23, 2021 Time of Death: 10:30 AM Notification: Venita Lick, NP notified of death of 2021/03/23   History of present illness:  KENETH BORG is a 76 y.o. male with a history of hypertension, hyperlipidemia, GERD, rheumatoid arthritis, CKD stage IIIa, CAD, non-Hodgkin lymphoma, CHF with EF of 40-45%, TV (s/p of treatment 2015), GERD. Harolyn Rutherford presented with complaint of altered mental status, shortness of breath and abdominal pain.  Apparently he was tested positive on 03/08/2021 and noted to have worsening confusion.  At baseline he was alert and oriented x3 per family.  He was also found to have increased edema and erythema of chronic ventral hernia with some cutaneous bowel exposure.  General surgery was consulted and he was started on IV antibiotics as he was not a surgical candidate with current respiratory status. Patient's family refused remdesivir as there were concern about his liver and kidneys. Patient with acute hypoxic respiratory failure requiring maximum level of oxygen with heated high flow, at 60 L and 100% FiO2.  Hypoxia continued to get worse.  Unable to obtain CTA due to worsening kidney functions, VQ scan was ordered but patient was unable to lay down for the exam. Lower extremity venous Doppler was negative. Patient met severe sepsis criteria with tachycardia, tachypnea, leukocytosis, lactic acidosis, and end organ damage with AKI, transaminitis, hypotension with COVID-19 pneumonia and abdominal wall cellulitis with ventral hernia infection. Due to worsening agitation and hypoxia patient appears very uncomfortable despite using low-dose Precedex and morphine.  Palliative care was consulted. Will continue to have discussions with the family regarding proceeding to comfort care only as patient was DNR and DNI.  Eventually  family reluctantly decided to proceed with comfort care on 03/12/2021 evening.  Patient was started on comfort care with Dilaudid infusion, does require frequent boluses due to worsening agitation. Patient passed at 10:30 AM in the presence of family and palliative care NP.  Final Diagnoses:  1.   Severe sepsis secondary to cellulitis and COVID-19 pneumonia 2.  Acute hypoxic respiratory failure secondary to COVID-19 pneumonia and sepsis.   The results of significant diagnostics from this hospitalization (including imaging, microbiology, ancillary and laboratory) are listed below for reference.    Significant Diagnostic Studies: CT HEAD WO CONTRAST (5MM)  Result Date: 03/19/2021 CLINICAL DATA:  Mental status change EXAM: CT HEAD WITHOUT CONTRAST TECHNIQUE: Contiguous axial images were obtained from the base of the skull through the vertex without intravenous contrast. COMPARISON:  None. FINDINGS: Brain: Hypodensity in the right anterior frontal lobe (series 2, image 19), of indeterminate acuity but favored to be subacute to chronic. No evidence of hemorrhage, cerebral edema, mass, mass effect, or midline shift. No hydrocephalus or extra-axial fluid collection. Periventricular white matter changes, likely the sequela of chronic small vessel ischemic disease. Lacunar infarct in the left basal ganglia. Vascular: No hyperdense vessel. Skull: Normal. Negative for fracture or focal lesion. Sinuses/Orbits: No acute finding. Status post left lens replacement. Other: The mastoid air cells are well aerated. IMPRESSION: IMPRESSION Hypodensity in the right anterior frontal lobe, of indeterminate acuity but favored to be subacute to chronic. Electronically Signed   By: Merilyn Baba M.D.   On: 03-19-2021 22:29   CT ABDOMEN PELVIS W CONTRAST  Result Date: 03/11/2021 CLINICAL DATA:  Abdominal pain, acute, nonlocalized. COVID pneumonia. EXAM: CT ABDOMEN AND PELVIS WITH CONTRAST TECHNIQUE: Multidetector CT  imaging  of the abdomen and pelvis was performed using the standard protocol following bolus administration of intravenous contrast. CONTRAST:  120m OMNIPAQUE IOHEXOL 300 MG/ML  SOLN COMPARISON:  02/23/2021 FINDINGS: Lower chest: There is extensive bibasilar ground-glass pulmonary infiltrate and focal consolidation within the right lower lobe in keeping with multifocal pneumonic infiltrate. No pleural effusion. Extensive multi-vessel coronary artery calcification. Global cardiac size within normal limits. Moderate hiatal hernia. Hepatobiliary: No focal liver abnormality is seen. No gallstones, gallbladder wall thickening, or biliary dilatation. High attenuation material within the gallbladder lumen likely reflects vicarious excretion of contrast. Pancreas: Unremarkable Spleen: Unremarkable Adrenals/Urinary Tract: The adrenal glands are unremarkable. The kidneys are normal in size and position. Multiple simple cortical cyst noted within the left kidney. There is progressive perinephric soft tissue infiltration surrounding the upper pole the right kidney of unclear significance. This may represent mild progressive hemorrhage or urine in the setting of trauma. No significant subcapsular hematoma, however, identified. No hydronephrosis. No intrarenal or ureteral calculi. Foley catheter balloon is seen within a decompressed bladder lumen. Stomach/Bowel: Diastasis of the rectus abdominus musculature and laxity of the anterior abdominal wall again noted with marked attenuation of the overlying subcutaneous fat. The stomach, small bowel, and large bowel are unremarkable. No free intraperitoneal gas or fluid. Vascular/Lymphatic: Aortic atherosclerosis. No enlarged abdominal or pelvic lymph nodes. Reproductive: Prostate is unremarkable. Other: None Musculoskeletal: L4-S1 lumbar fusion with instrumentation is again noted. Advanced degenerative changes are noted throughout the visualized thoracolumbar spine. Remote T9 and T11  compression deformities are again noted. 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 No acute bone abnormality. IMPRESSION: Extensive bibasilar pulmonary infiltrate, likely infectious or inflammatory, and in keeping with the given history of acute COVID-19 pneumonia. Extensive multi-vessel coronary artery calcification. Slight interval increase in mild perinephric infiltrative fluid surrounding the upper pole the right kidney. Correlation for history of local trauma may be helpful as this may represent minimal perinephric hemorrhage or urine. Moderate hiatal hernia. Stable remote compression deformities of T9 and T11. 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 Electronically Signed   By: AFidela SalisburyM.D.   On: 03/11/2021 02:57   CT ABDOMEN PELVIS W CONTRAST  Result Date: 03/20/2021 CLINICAL DATA:  Abdominal distension, being treated for open wound from hernia surgery concern for bowel obstruction. EXAM: CT ABDOMEN AND PELVIS WITH CONTRAST TECHNIQUE: Multidetector CT imaging of the abdomen and pelvis was performed using the standard protocol following bolus administration of intravenous contrast. CONTRAST:  1023mOMNIPAQUE IOHEXOL 300 MG/ML  SOLN COMPARISON:  None. FINDINGS: Lower chest: Mosaic attenuation of the lung bases with geographic regions of ground-glass opacities and more nodular consolidative areas. Normal size heart. Three-vessel coronary artery disease. Hepatobiliary: No suspicious hepatic lesion. Gallbladder is distended without wall thickening or pericholecystic fluid. No biliary ductal dilation. Pancreas: No pancreatic ductal dilation or evidence of acute inflammation. Diffuse pancreatic atrophy. Spleen: Within normal limits. Adrenals/Urinary Tract: Bilateral adrenal glands are unremarkable. No hydronephrosis. Left renal cysts measuring up to 3.2 cm. Urinary bladder is unremarkable for degree of distension. Stomach/Bowel: Large hiatal hernia. No pathologic  dilation of large or small bowel. No evidence of acute bowel inflammation. Large fat and nonobstructed bowel containing ventral hernia with a 6.4 cm aperture width. Laxity of the ventral abdominal wall containing fat and bowel which appears to extend to the left paramedian cutaneous surface. Vascular/Lymphatic: Aortic and branch vessel atherosclerosis without abdominal aortic aneurysm. No pathologically enlarged abdominal or pelvic lymph nodes. Reproductive: Prostate is  unremarkable. Other: No significant abdominopelvic free fluid. Musculoskeletal: Degenerative and postoperative changes of the spine. Similar appearance of the T9 and T11 vertebral compression deformities. Avascular necrosis of the left femoral head. IMPRESSION: 1. No evidence of bowel obstruction. 2. Large fat and nonobstructed bowel containing ventral hernia with a 6.4 cm aperture. 3. Laxity of the ventral abdominal wall with peritoneal fat and loops of nonobstructed bowel extending to the cutaneous skin surface. 4. Mosaic attenuation of the lung bases with geographic regions of ground-glass opacities and more nodular consolidative areas likely reflects infectious or inflammatory process. 5. Large hiatal hernia. 6. Avascular necrosis of the left femoral head. 7. Similar appearance of the T9 and T11 vertebral compression deformities. 8.  Aortic Atherosclerosis (ICD10-I70.0). Electronically Signed   By: Dahlia Bailiff M.D.   On: 02/24/2021 13:46   US Venous Img Lower Bilateral (DVT)  Result Date: 03/10/2021 CLINICAL DATA:  76 year old male with lower extremity swelling. EXAM: BILATERAL LOWER EXTREMITY VENOUS DOPPLER ULTRASOUND TECHNIQUE: Gray-scale sonography with graded compression, as well as color Doppler and duplex ultrasound were performed to evaluate the lower extremity deep venous systems from the level of the common femoral vein and including the common femoral, femoral, profunda femoral, popliteal and calf veins including the posterior  tibial, peroneal and gastrocnemius veins when visible. The superficial great saphenous vein was also interrogated. Spectral Doppler was utilized to evaluate flow at rest and with distal augmentation maneuvers in the common femoral, femoral and popliteal veins. COMPARISON:  None. FINDINGS: RIGHT LOWER EXTREMITY Common Femoral Vein: No evidence of thrombus. Normal compressibility, respiratory phasicity and response to augmentation. Saphenofemoral Junction: No evidence of thrombus. Normal compressibility and flow on color Doppler imaging. Profunda Femoral Vein: No evidence of thrombus. Normal compressibility and flow on color Doppler imaging. Femoral Vein: No evidence of thrombus. Normal compressibility, respiratory phasicity and response to augmentation. Popliteal Vein: No evidence of thrombus. Normal compressibility, respiratory phasicity and response to augmentation. Calf Veins: Limited evaluation, however no evidence of thrombus. Other Findings:  None. LEFT LOWER EXTREMITY Common Femoral Vein: No evidence of thrombus. Normal compressibility, respiratory phasicity and response to augmentation. Saphenofemoral Junction: No evidence of thrombus. Normal compressibility and flow on color Doppler imaging. Profunda Femoral Vein: No evidence of thrombus. Normal compressibility and flow on color Doppler imaging. Femoral Vein: No evidence of thrombus. Normal compressibility, respiratory phasicity and response to augmentation. Popliteal Vein: No evidence of thrombus. Normal compressibility, respiratory phasicity and response to augmentation. Calf Veins: Limited evaluation, however no evidence of thrombus. Other Findings:  None. IMPRESSION: No evidence of bilateral lower extremity deep vein thrombosis. Limited evaluation of the calf veins. Ruthann Cancer, MD [facility] Electronically Signed   By: Ruthann Cancer M.D.   On: 03/10/2021 09:23   DG Chest Port 1  View  Result Date: 03/12/2021 CLINICAL DATA:  Acute respiratory failure, hypoxia EXAM: PORTABLE CHEST 1 VIEW COMPARISON:  02/25/2021 FINDINGS: Patchy bilateral airspace disease, diffuse on the right and in the left lower to mid lung, worsening on the left since prior study. Heart is normal size. No effusions or acute bony abnormality. IMPRESSION: Bilateral airspace disease, right greater than left, worsening on the left since prior study. Electronically Signed   By: Rolm Baptise M.D.   On: 03/12/2021 08:43   DG Chest Port 1 View  Result Date: 03/05/2021 CLINICAL DATA:  Questionable sepsis.  Evaluate for abnormality. EXAM: PORTABLE CHEST 1 VIEW COMPARISON:  09/06/2020 FINDINGS: Patchy densities throughout the mid and lower right  chest. Patchy densities in the left lower chest. These lung densities are new. Surgical hardware in the lower cervical spine. Heart size is grossly stable. IMPRESSION: Bilateral lung densities, right side greater than left. Findings are suggestive for bilateral pneumonia. Electronically Signed   By: Markus Daft M.D.   On: 03/16/2021 11:42    Microbiology: Recent Results (from the past 240 hour(s))  Blood Culture (routine x 2)     Status: None (Preliminary result)   Collection Time: 03/05/2021 11:26 AM   Specimen: BLOOD RIGHT HAND  Result Value Ref Range Status   Specimen Description BLOOD RIGHT HAND  Final   Special Requests   Final    BOTTLES DRAWN AEROBIC AND ANAEROBIC Blood Culture results may not be optimal due to an inadequate volume of blood received in culture bottles   Culture   Final    NO GROWTH 4 DAYS Performed at [facility], [address] [place], [place] [address]    Report Status PENDING  Incomplete  Resp Panel by RT-PCR (Flu A&B, Covid) Nasopharyngeal Swab     Status: Abnormal   Collection Time: 02/26/2021  4:07 PM   Specimen: Nasopharyngeal Swab; Nasopharyngeal(NP) swabs in vial transport medium  Result Value Ref Range Status   SARS  Coronavirus 2 by RT PCR POSITIVE (A) NEGATIVE Final    Comment: (NOTE) SARS-CoV-2 target nucleic acids are DETECTED.  The SARS-CoV-2 RNA is generally detectable in upper respiratory specimens during the acute phase of infection. Positive results are indicative of the presence of the identified virus, but do not rule out bacterial infection or co-infection with other pathogens not detected by the test. Clinical correlation with patient history and other diagnostic information is necessary to determine patient infection status. The expected result is Negative.  Fact Sheet for Patients: EntrepreneurPulse.com.au  Fact Sheet for Healthcare Providers: IncredibleEmployment.be  This test is not yet approved or cleared by the [place] FDA and  has been authorized for detection and/or diagnosis of SARS-CoV-2 by FDA under an Emergency Use Authorization (EUA).  This EUA will remain in effect (meaning this test can be used) for the duration of  the COVID-19 declaration under Section 564(b)(1) of the A ct, 21 U.S.C. section 360bbb-3(b)(1), unless the authorization is terminated or revoked sooner.     Influenza A by PCR NEGATIVE NEGATIVE Final   Influenza B by PCR NEGATIVE NEGATIVE Final    Comment: (NOTE) The Xpert Xpress SARS-CoV-2/FLU/RSV plus assay is intended as an aid in the diagnosis of influenza from Nasopharyngeal swab specimens and should not be used as a sole basis for treatment. Nasal washings and aspirates are unacceptable for Xpert Xpress SARS-CoV-2/FLU/RSV testing.  Fact Sheet for Patients: EntrepreneurPulse.com.au  Fact Sheet for Healthcare Providers: IncredibleEmployment.be  This test is not yet approved or cleared by the [place] FDA and has been authorized for detection and/or diagnosis of SARS-CoV-2 by FDA under an Emergency Use Authorization (EUA). This EUA will remain in effect (meaning  this test can be used) for the duration of the COVID-19 declaration under Section 564(b)(1) of the Act, 21 U.S.C. section 360bbb-3(b)(1), unless the authorization is terminated or revoked.  Performed at [facility], [address] [place], [place] [address]   Blood Culture (routine x 2)     Status: None (Preliminary result)   Collection Time: 03/03/2021  4:10 PM   Specimen: BLOOD  Result Value Ref Range Status   Specimen Description BLOOD [facility]  Final   Special Requests BOTTLES DRAWN AEROBIC  AND ANAEROBIC BCAV  Final   Culture   Final    NO GROWTH 4 DAYS Performed at [facility], [address] [place], [place] [address]    Report Status PENDING  Incomplete     Labs: Basic Metabolic Panel: Recent Labs  Lab 02/25/2021 1126 03/10/21 0752 03/11/21 0251 03/11/21 1015 03/12/21 0300  NA 135 141 141  --  144  K 3.5 3.6 3.4*  --  3.3*  CL 96* 103 104  --  110  CO2 24 21* 23  --  25  GLUCOSE 92 134* 119*  --  153*  BUN 38* 38* 41*  --  36*  CREATININE 1.43* 1.28* 1.26*  --  0.81  CALCIUM 8.5* 7.6* 7.7*  --  7.8*  MG  --  2.1  --  2.0  --   PHOS  --  5.1*  --  2.3*  --    Liver Function Tests: Recent Labs  Lab 02/25/2021 1126 03/10/21 0752 03/11/21 0251 03/12/21 0300  AST 332* 196* 132* 59*  ALT 72* 66* 63* 50*  ALKPHOS 82 68 66 64  BILITOT 1.3* 1.5* 1.3* 1.5*  PROT 6.6 5.8* 5.7* 5.4*  ALBUMIN 3.0* 2.4* 2.4* 2.1*   No results for input(s): LIPASE, AMYLASE in the last 168 hours. Recent Labs  Lab 03/20/2021 1824  AMMONIA 20   CBC: Recent Labs  Lab 03/03/2021 1126 03/10/21 0752 03/11/21 0251 03/12/21 0300  WBC 17.1* 13.2* 10.5 8.9  NEUTROABS 15.4* 11.4* 9.6* 7.8*  HGB 13.2 11.6* 11.2* 11.3*  HCT 39.3 35.2* 33.8* 33.3*  MCV 92.9 93.6 93.6 94.6  PLT 213 197 160 126*   Cardiac Enzymes: No results for input(s): CKTOTAL, CKMB, CKMBINDEX, TROPONINI in the last 168 hours. D-Dimer Recent Labs    03/11/21 0251 03/12/21 0300  DDIMER 8.83* 4.38*    BNP: Invalid input(s): POCBNP CBG: Recent Labs  Lab 02/28/2021 1604  GLUCAP 86   Anemia work up Recent Labs    03/11/21 0251 03/12/21 0300  FERRITIN 345* 321   Urinalysis    Component Value Date/Time   COLORURINE YELLOW (A) 03/11/2021 0451   APPEARANCEUR CLEAR (A) 03/11/2021 0451   LABSPEC >1.046 (H) 03/11/2021 0451   PHURINE 6.0 03/11/2021 0451   GLUCOSEU NEGATIVE 03/11/2021 0451   HGBUR MODERATE (A) 03/11/2021 0451   BILIRUBINUR NEGATIVE 03/11/2021 0451   KETONESUR 20 (A) 03/11/2021 0451   PROTEINUR 30 (A) 03/11/2021 0451   NITRITE NEGATIVE 03/11/2021 0451   LEUKOCYTESUR NEGATIVE 03/11/2021 0451   Sepsis Labs Invalid input(s): PROCALCITONIN,  WBC,  LACTICIDVEN   SIGNED:  Lorella Nimrod, MD  [facility] 03-29-21, 12:23 PM Pager   If 7PM-7AM, please contact night-coverage [email or web address] Password [email or web address]  This record has been created using Systems analyst. Errors have been sought and corrected,but may not always be located. Such creation errors do not reflect on the standard of care.

## 2021-03-25 DEATH — deceased

## 2021-04-13 ENCOUNTER — Ambulatory Visit: Payer: Medicare Other | Admitting: Nurse Practitioner

## 2022-07-17 IMAGING — CT CT ABD-PELV W/ CM
3 of 6 series · 15 of 46 positions shown, 17 images · IV contrast (APPLIED)
Comparison: None.

CLINICAL DATA: Abdominal distension, being treated for open wound
from hernia surgery concern for bowel obstruction.

EXAM:
CT ABDOMEN AND PELVIS WITH CONTRAST
TECHNIQUE: Multidetector CT imaging of the abdomen and pelvis was performed
using the standard protocol following bolus administration of
intravenous contrast.
CONTRAST:  100mL OMNIPAQUE IOHEXOL 300 MG/ML  SOLN

[Series 4: lung · axial · 0.76mm/px · z∈[+408,+458]mm · 2 of 30 slices shown]
[im 10/30  bone]
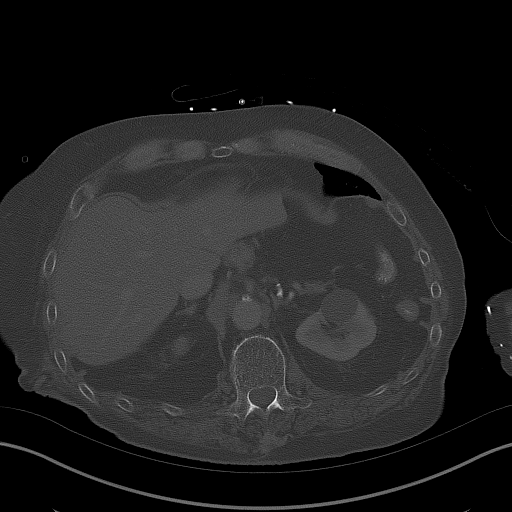
[im 20/30  bone]
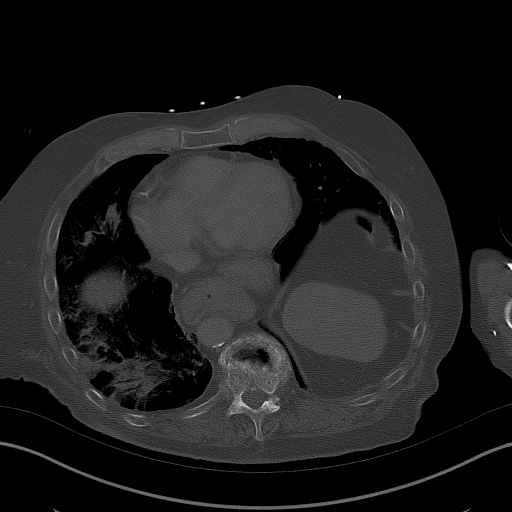

[Series 5: coronal st · coronal · 0.64mm/px · 3 of 98 slices shown]
[im 33/98  soft-tissue]
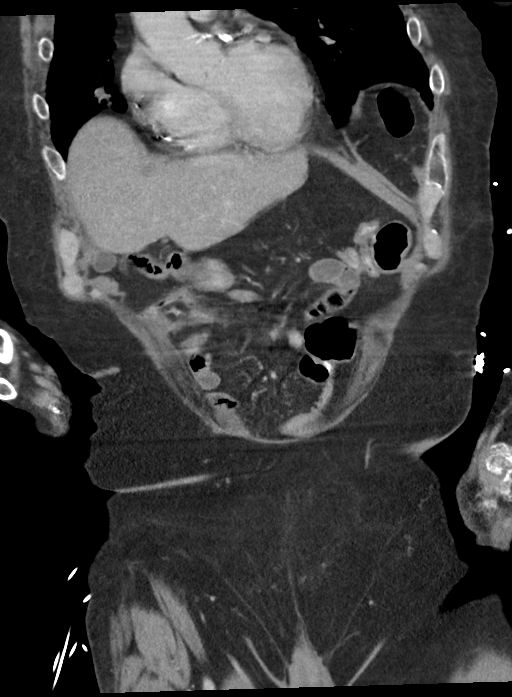
[im 44/98  soft-tissue]
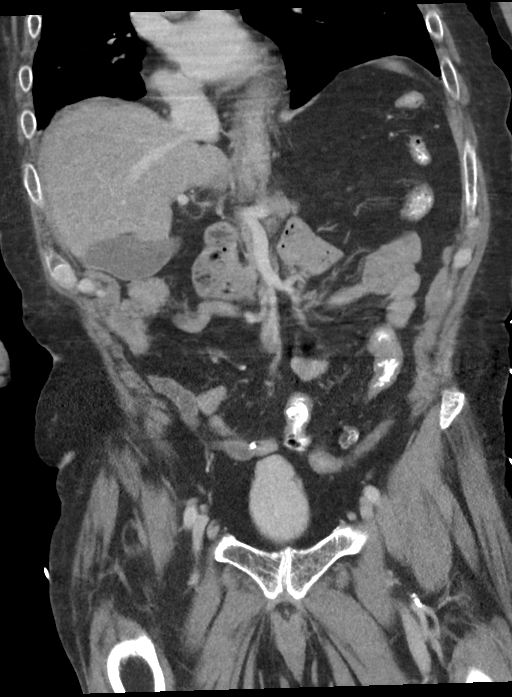
[im 54/98  soft-tissue]
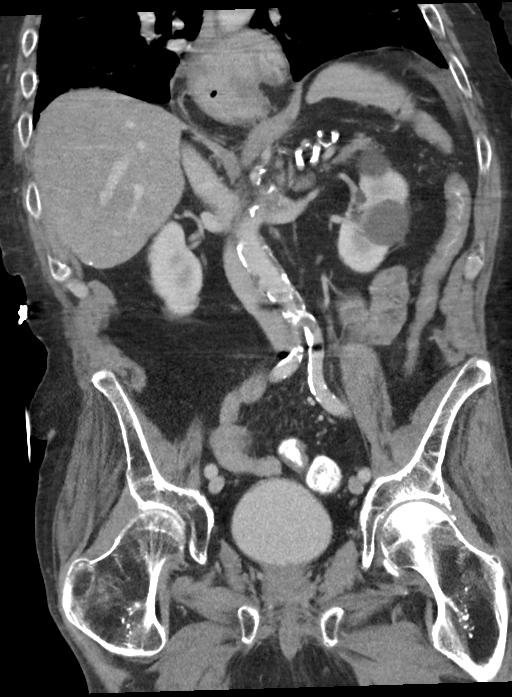

[Series 7: routine abd/pel with (person_name) · axial · 0.76mm/px · z∈[+108,+468]mm · 10 of 88 slices shown, 12 images]
[im 8/88  soft-tissue]
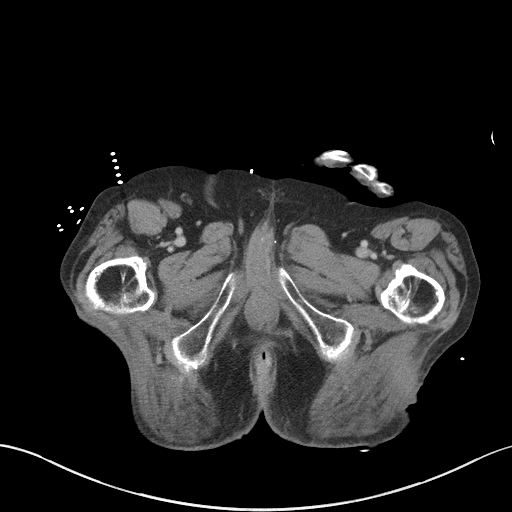
[im 8/88  bone]
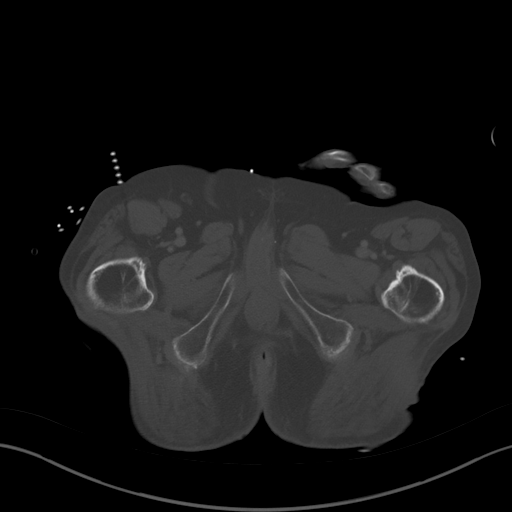
[im 16/88  soft-tissue]
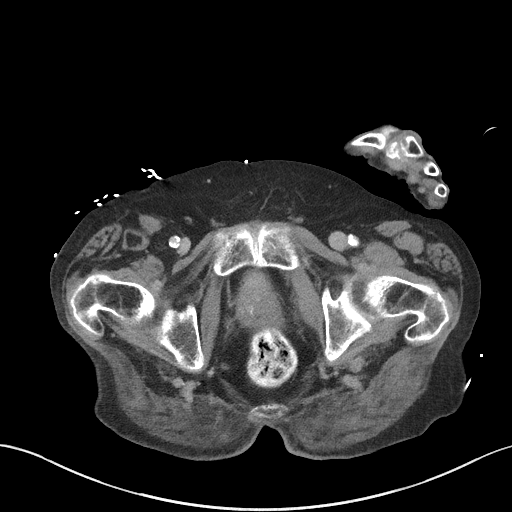
[im 24/88  soft-tissue]
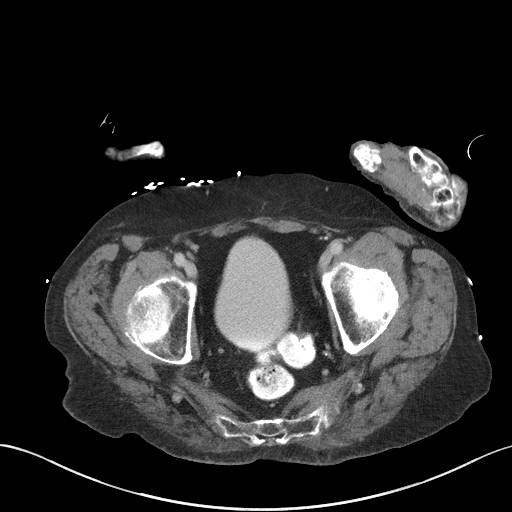
[im 32/88  soft-tissue]
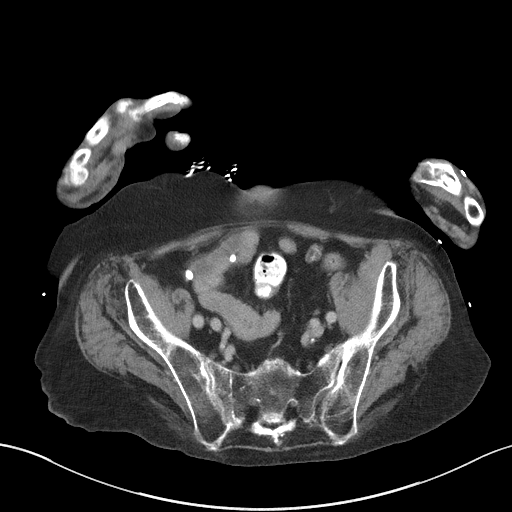
[im 40/88  soft-tissue]
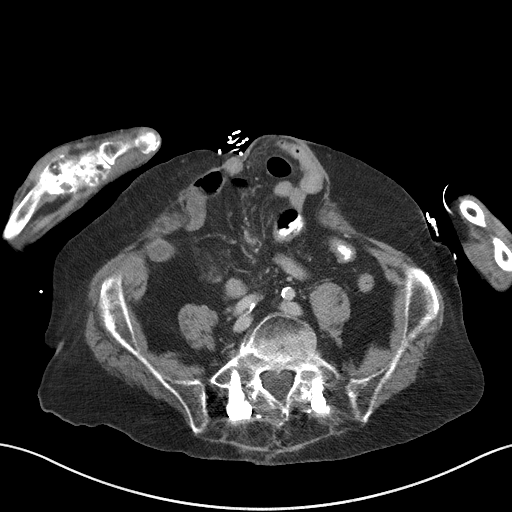
[im 48/88  soft-tissue]
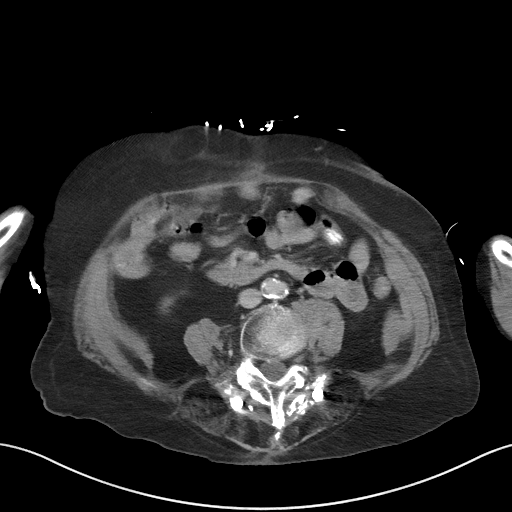
[im 56/88  soft-tissue]
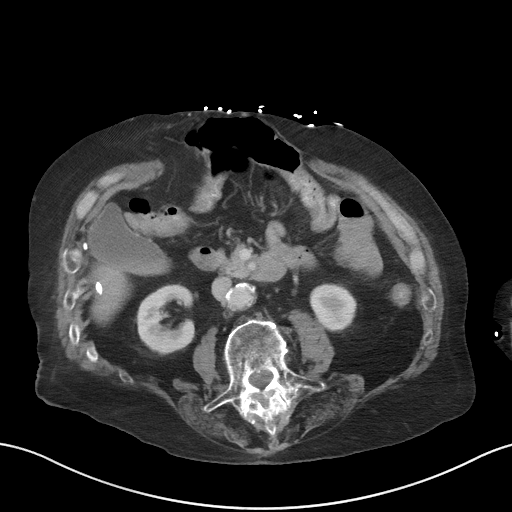
[im 64/88  soft-tissue]
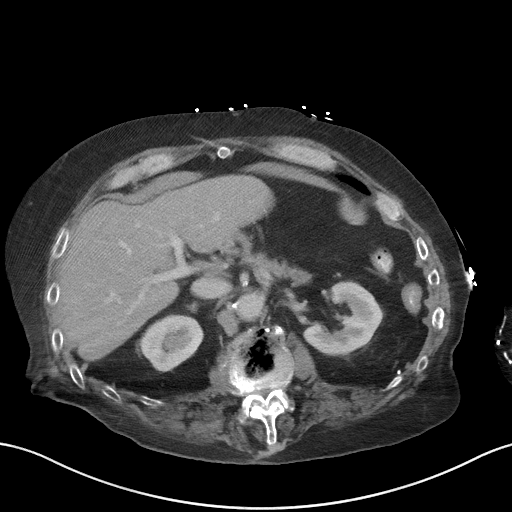
[im 72/88  soft-tissue]
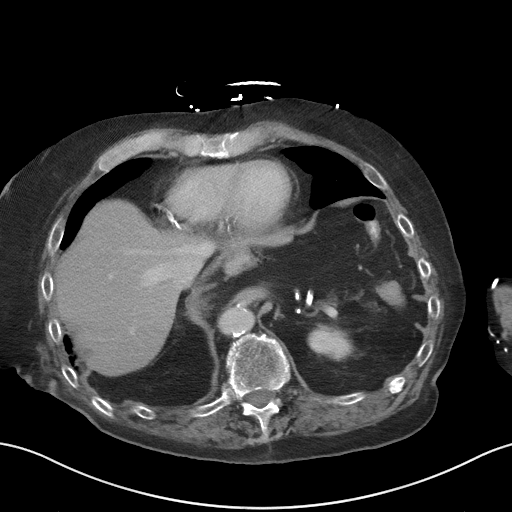
[im 72/88  bone]
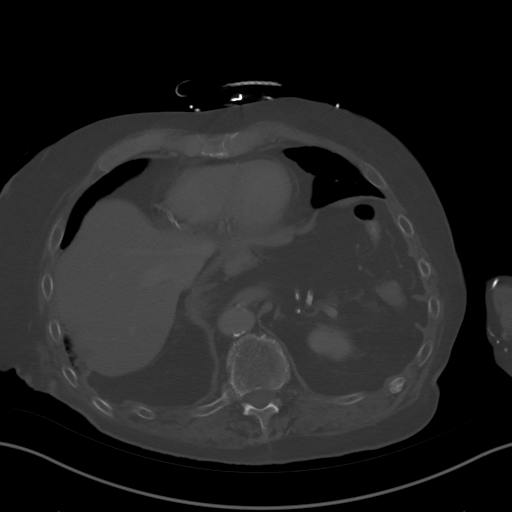
[im 80/88  soft-tissue]
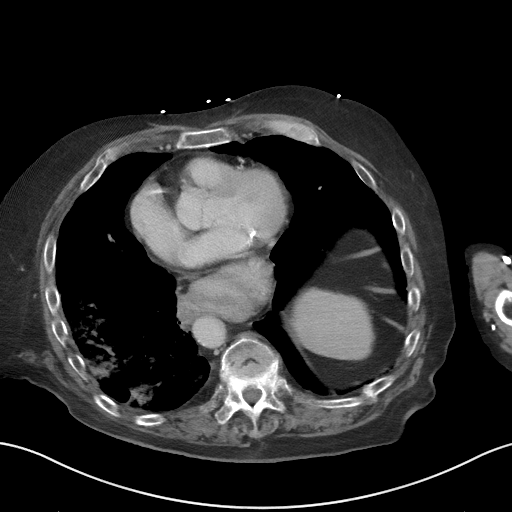

[15 of 46 positions shown; findings below may reference images not displayed]

FINDINGS: Lower chest: Mosaic attenuation of the lung bases with geographic
regions of ground-glass opacities and more nodular consolidative
areas. Normal size heart. Three-vessel coronary artery disease.

Hepatobiliary: No suspicious hepatic lesion. Gallbladder is
distended without wall thickening or pericholecystic fluid. No
biliary ductal dilation.

Pancreas: No pancreatic ductal dilation or evidence of acute
inflammation. Diffuse pancreatic atrophy.

Spleen: Within normal limits.

Adrenals/Urinary Tract: Bilateral adrenal glands are unremarkable.
No hydronephrosis. Left renal cysts measuring up to 3.2 cm. Urinary
bladder is unremarkable for degree of distension.

Stomach/Bowel: Large hiatal hernia. No pathologic dilation of large
or small bowel. No evidence of acute bowel inflammation.

Large fat and nonobstructed bowel containing ventral hernia with a
6.4 cm aperture width. Laxity of the ventral abdominal wall
containing fat and bowel which appears to extend to the left
paramedian cutaneous surface.

Vascular/Lymphatic: Aortic and branch vessel atherosclerosis without
abdominal aortic aneurysm. No pathologically enlarged abdominal or
pelvic lymph nodes.

Reproductive: Prostate is unremarkable.

Other: No significant abdominopelvic free fluid.

Musculoskeletal: Degenerative and postoperative changes of the
spine. Similar appearance of the T9 and T11 vertebral compression
deformities. Avascular necrosis of the left femoral head.
IMPRESSION: 1. No evidence of bowel obstruction.
2. Large fat and nonobstructed bowel containing ventral hernia with
a 6.4 cm aperture.
3. Laxity of the ventral abdominal wall with peritoneal fat and
loops of nonobstructed bowel extending to the cutaneous skin
surface.
4. Mosaic attenuation of the lung bases with geographic regions of
ground-glass opacities and more nodular consolidative areas likely
reflects infectious or inflammatory process.
5. Large hiatal hernia.
6. Avascular necrosis of the left femoral head.
7. Similar appearance of the T9 and T11 vertebral compression
deformities.
8.  Aortic Atherosclerosis ([id number].A).

## 2022-07-20 IMAGING — DX DG CHEST 1V PORT
1 series · 1 of 1 positions shown · non-contrast
Comparison: 03/09/2021

CLINICAL DATA: Acute respiratory failure, hypoxia

EXAM:
PORTABLE CHEST 1 VIEW

[chest ap]
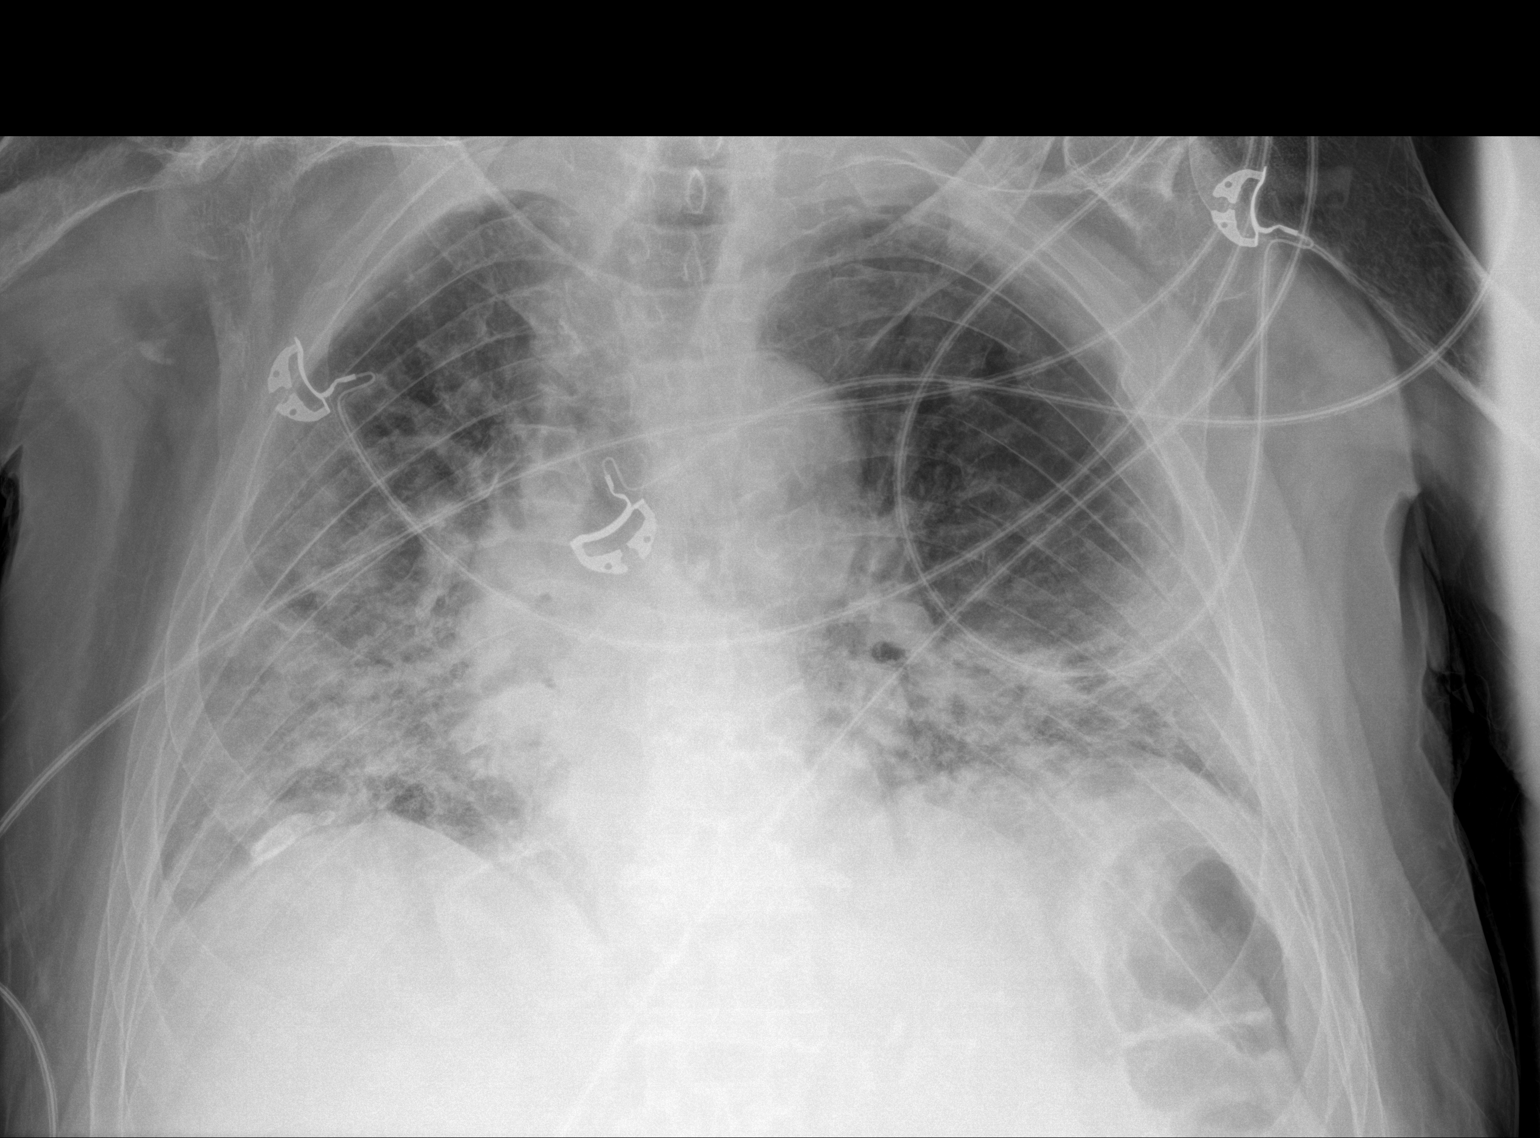

[1 of 1 positions shown; findings below may reference images not displayed]

FINDINGS: Patchy bilateral airspace disease, diffuse on the right and in the
left lower to mid lung, worsening on the left since prior study.
Heart is normal size. No effusions or acute bony abnormality.
IMPRESSION: Bilateral airspace disease, right greater than left, worsening on
the left since prior study.
